# Patient Record
Sex: Female | Born: 1935 | Race: White | Hispanic: No | Marital: Single | Smoking: Former smoker
Health system: Southern US, Community
[De-identification: ages and names within clinical notes are randomized; demographics above are authoritative.]

## PROBLEM LIST (undated history)

## (undated) DIAGNOSIS — F329 Major depressive disorder, single episode, unspecified: Secondary | ICD-10-CM

## (undated) DIAGNOSIS — F1011 Alcohol abuse, in remission: Secondary | ICD-10-CM

## (undated) DIAGNOSIS — I6523 Occlusion and stenosis of bilateral carotid arteries: Secondary | ICD-10-CM

## (undated) DIAGNOSIS — C801 Malignant (primary) neoplasm, unspecified: Secondary | ICD-10-CM

## (undated) DIAGNOSIS — I219 Acute myocardial infarction, unspecified: Secondary | ICD-10-CM

## (undated) DIAGNOSIS — E079 Disorder of thyroid, unspecified: Secondary | ICD-10-CM

## (undated) DIAGNOSIS — N63 Unspecified lump in unspecified breast: Secondary | ICD-10-CM

## (undated) DIAGNOSIS — W1782XA Fall from (out of) grocery cart, initial encounter: Secondary | ICD-10-CM

## (undated) DIAGNOSIS — F32A Depression, unspecified: Secondary | ICD-10-CM

## (undated) DIAGNOSIS — C50919 Malignant neoplasm of unspecified site of unspecified female breast: Secondary | ICD-10-CM

## (undated) DIAGNOSIS — M5126 Other intervertebral disc displacement, lumbar region: Secondary | ICD-10-CM

## (undated) DIAGNOSIS — K219 Gastro-esophageal reflux disease without esophagitis: Secondary | ICD-10-CM

## (undated) DIAGNOSIS — I10 Essential (primary) hypertension: Secondary | ICD-10-CM

## (undated) DIAGNOSIS — E785 Hyperlipidemia, unspecified: Secondary | ICD-10-CM

## (undated) HISTORY — DX: Hyperlipidemia, unspecified: E78.5

## (undated) HISTORY — PX: BREAST BIOPSY: SHX20

## (undated) HISTORY — DX: Unspecified lump in unspecified breast: N63.0

## (undated) HISTORY — DX: Fall from (out of) grocery cart, initial encounter: W17.82XA

## (undated) HISTORY — DX: Disorder of thyroid, unspecified: E07.9

## (undated) HISTORY — DX: Depression, unspecified: F32.A

## (undated) HISTORY — DX: Major depressive disorder, single episode, unspecified: F32.9

## (undated) HISTORY — DX: Acute myocardial infarction, unspecified: I21.9

## (undated) HISTORY — DX: Essential (primary) hypertension: I10

## (undated) HISTORY — DX: Malignant (primary) neoplasm, unspecified: C80.1

## (undated) HISTORY — DX: Other intervertebral disc displacement, lumbar region: M51.26

## (undated) HISTORY — DX: Gastro-esophageal reflux disease without esophagitis: K21.9

## (undated) HISTORY — DX: Occlusion and stenosis of bilateral carotid arteries: I65.23

## (undated) HISTORY — DX: Malignant neoplasm of unspecified site of unspecified female breast: C50.919

## (undated) HISTORY — DX: Alcohol abuse, in remission: F10.11

---

## 1997-08-01 DIAGNOSIS — I219 Acute myocardial infarction, unspecified: Secondary | ICD-10-CM

## 1997-08-01 HISTORY — DX: Acute myocardial infarction, unspecified: I21.9

## 2004-12-08 ENCOUNTER — Encounter: Admission: RE | Admit: 2004-12-08 | Discharge: 2004-12-08 | Payer: Self-pay | Admitting: Internal Medicine

## 2004-12-14 ENCOUNTER — Encounter: Admission: RE | Admit: 2004-12-14 | Discharge: 2004-12-14 | Payer: Self-pay | Admitting: Internal Medicine

## 2004-12-16 ENCOUNTER — Encounter: Admission: RE | Admit: 2004-12-16 | Discharge: 2004-12-16 | Payer: Self-pay | Admitting: Internal Medicine

## 2005-12-09 ENCOUNTER — Encounter: Admission: RE | Admit: 2005-12-09 | Discharge: 2005-12-09 | Payer: Self-pay | Admitting: Internal Medicine

## 2006-03-20 ENCOUNTER — Encounter: Admission: RE | Admit: 2006-03-20 | Discharge: 2006-03-20 | Payer: Self-pay | Admitting: Internal Medicine

## 2006-10-27 ENCOUNTER — Ambulatory Visit: Payer: Self-pay | Admitting: Vascular Surgery

## 2006-12-13 ENCOUNTER — Encounter: Admission: RE | Admit: 2006-12-13 | Discharge: 2006-12-13 | Payer: Self-pay | Admitting: *Deleted

## 2007-05-02 ENCOUNTER — Ambulatory Visit: Payer: Self-pay | Admitting: Vascular Surgery

## 2007-11-14 ENCOUNTER — Ambulatory Visit: Payer: Self-pay | Admitting: Vascular Surgery

## 2007-12-14 ENCOUNTER — Encounter: Admission: RE | Admit: 2007-12-14 | Discharge: 2007-12-14 | Payer: Self-pay | Admitting: Family Medicine

## 2008-05-14 ENCOUNTER — Ambulatory Visit: Payer: Self-pay | Admitting: Vascular Surgery

## 2008-11-19 ENCOUNTER — Ambulatory Visit: Payer: Self-pay | Admitting: Vascular Surgery

## 2008-12-15 ENCOUNTER — Encounter: Admission: RE | Admit: 2008-12-15 | Discharge: 2008-12-15 | Payer: Self-pay | Admitting: Family Medicine

## 2009-05-06 ENCOUNTER — Ambulatory Visit: Payer: Self-pay | Admitting: Vascular Surgery

## 2009-11-18 ENCOUNTER — Ambulatory Visit: Payer: Self-pay | Admitting: Vascular Surgery

## 2009-12-16 ENCOUNTER — Encounter: Admission: RE | Admit: 2009-12-16 | Discharge: 2009-12-16 | Payer: Self-pay | Admitting: Family Medicine

## 2010-06-10 ENCOUNTER — Ambulatory Visit: Payer: Self-pay | Admitting: Vascular Surgery

## 2010-08-01 HISTORY — PX: BREAST LUMPECTOMY: SHX2

## 2010-11-15 ENCOUNTER — Other Ambulatory Visit: Payer: Self-pay | Admitting: Family Medicine

## 2010-11-15 DIAGNOSIS — Z1231 Encounter for screening mammogram for malignant neoplasm of breast: Secondary | ICD-10-CM

## 2010-12-14 NOTE — Procedures (Signed)
CAROTID DUPLEX EXAM   INDICATION:  Followup carotid artery disease.   HISTORY:  Diabetes:  Borderline.  Cardiac:  No.  Hypertension:  Yes.  Smoking:  Quit.  Previous Surgery:  No.  CV History:  No.  Amaurosis Fugax No, Paresthesias No, Hemiparesis No                                       RIGHT             LEFT  Brachial systolic pressure:         142               150  Brachial Doppler waveforms:         Biphasic          Biphasic  Vertebral direction of flow:        Antegrade         Antegrade  DUPLEX VELOCITIES (cm/sec)  CCA peak systolic                   78                94  ECA peak systolic                   334               316  ICA peak systolic                   162               309  ICA end diastolic                   41                81  PLAQUE MORPHOLOGY:                  Calcific          Calcific  PLAQUE AMOUNT:                      Moderate          Moderate/severe  PLAQUE LOCATION:                    ICA/ECA/CCA       ICA/ECA/CCA   IMPRESSION:  1. Right ICA shows evidence of 40-59% stenosis.  2. Left ICA shows evidence of 60-79% stenosis.  3. Bilateral ECA stenosis.  4. Heavy calcific plaque with shadowing bilaterally.  5. No significant changes from previous study.   ___________________________________________  Janetta Hora Fields, MD   AS/MEDQ  D:  05/14/2008  T:  05/14/2008  Job:  [id number]

## 2010-12-14 NOTE — Assessment & Plan Note (Signed)
OFFICE VISIT   Tammie [name], Tammie [name]  DOB:  February 13, 1934                                       05/02/2007  CHART#:[id number]   The patient returns for followup today for [name] moderate carotid stenosis.  She was last seen in March of 2008.  She has now been smoke-free for 8  months and I congratulated her on this today.  She continues to take  Plavix and aspirin.  She takes that her blood pressure and cholesterol  have had reasonable control.  She reports no symptoms of TIA, amaurosis,  or stroke.   PHYSICAL EXAM:  Blood pressure 164/63 in the left arm, 163/65 in the  right arm, heart rate is 47 and regular.  HEENT is unremarkable.  She  has 2+ carotid pulses with [name] left carotid bruit.  Neurologically, upper  extremity motor strength is 5/5 and symmetric.   She had been having some coolness in her feet recently.  However, on  lower extremity arterial exam, she has 2+ dorsalis pedis and posterior  tibial pulses in her right foot, [name] 2+ dorsalis pedis pulse in the left  foot, absent posterior tibial pulse in the left foot.  Her feet are warm  and well-perfused.   She had [name] repeat carotid duplex exam today, which shows less than 40%  right internal carotid artery stenosis and 40-60% left internal carotid  artery stenosis.  There was some calcification bilaterally, so this may  be actually slightly higher.  However, I do not believe her carotid  stenosis is significant enough to warrant intervention at this time.  She should continue her Plavix and aspirin.  We will continue to do  intermittent carotid duplex exams to make sure she has not had  progression of disease.  Hopefully, since she has quit smoking, this  will not progress over time.  As far as her lower extremities are  concerned, she has easily palpable pulses.  I do not believe  that she has any significant lower extremity arterial occlusive disease  that requires management at this time.  She will return for  her  intermittent carotid duplex exams.   [name], MD  Electronically Signed   CEF/MEDQ  D:  05/02/2007  T:  05/03/2007  Job:  [id number]   cc:   Darius Bump, M.D.

## 2010-12-14 NOTE — Procedures (Signed)
CAROTID DUPLEX EXAM   INDICATION:  Follow up carotid artery disease.   HISTORY:  Diabetes:  No.  Cardiac:  No.  Hypertension:  Yes.  Smoking:  Previous.  Previous Surgery:  No.  CV History:  Asymptomatic.  Amaurosis Fugax No, Paresthesias No, Hemiparesis No.                                       RIGHT             LEFT  Brachial systolic pressure:         136               140  Brachial Doppler waveforms:         WNL               WNL  Vertebral direction of flow:        Antegrade         Antegrade  DUPLEX VELOCITIES (cm/sec)  CCA peak systolic                   87                93  ECA peak systolic                   331               455  ICA peak systolic                   164               299  ICA end diastolic                   34                66  PLAQUE MORPHOLOGY:                  Calcific          Calcific  PLAQUE AMOUNT:                      Moderate          Moderate/severe  PLAQUE LOCATION:                    ICA/ECA/CCA       ICA/ECA/CCA   IMPRESSION:  1. Right internal carotid artery shows evidence of 40% to 59%      stenosis.  2. Left internal carotid artery shows evidence of 60% to 79% stenosis.  3. Bilateral external carotid artery stenoses.  4. No significant changes from previous study.   ___________________________________________  Janetta Hora Fields, MD   AS/MEDQ  D:  11/18/2009  T:  11/18/2009  Job:  [id number]

## 2010-12-14 NOTE — Procedures (Signed)
CAROTID DUPLEX EXAM   INDICATION:  Follow-up of known carotid artery disease.   HISTORY:  Diabetes:  No.  Cardiac:  No.  Hypertension:  Yes.  Smoking:  Quit 3 years ago.  Previous Surgery:  No.  CV History:  No.  Amaurosis Fugax No, Paresthesias No, Hemiparesis No.                                       RIGHT             LEFT  Brachial systolic pressure:         124               116  Brachial Doppler waveforms:         Biphasic          Biphasic  Vertebral direction of flow:        Antegrade         Antegrade  DUPLEX VELOCITIES (cm/sec)  CCA peak systolic                   108               107  ECA peak systolic                   392               441  ICA peak systolic                   183               333  ICA end diastolic                   42                76  PLAQUE MORPHOLOGY:                  Calcified         Calcified  PLAQUE AMOUNT:                      Moderate          Moderate-to-severe  PLAQUE LOCATION:                    CCA, ICA, ECA     CCA, ICA, ECA   IMPRESSION:  1. 40-59% right internal carotid artery stenosis.  2. 60-79% left internal carotid artery stenosis.     ___________________________________________  Janetta Hora Fields, MD   AC/MEDQ  D:  11/19/2008  T:  11/19/2008  Job:  [id number]

## 2010-12-14 NOTE — Procedures (Signed)
CAROTID DUPLEX EXAM   INDICATION:  Carotid disease.   HISTORY:  Diabetes:  No.  Cardiac:  No.  Hypertension:  Yes.  Smoking:  Previous.  Previous Surgery:  No.  CV History:  Currently asymptomatic.  Amaurosis Fugax No, Paresthesias No, Hemiparesis No                                       RIGHT             LEFT  Brachial systolic pressure:         128               120  Brachial Doppler waveforms:         Normal            Normal  Vertebral direction of flow:        Antegrade         Antegrade  DUPLEX VELOCITIES (cm/sec)  CCA peak systolic                   92                93  ECA peak systolic                   218               392  ICA peak systolic                   166               295  ICA end diastolic                   31                57  PLAQUE MORPHOLOGY:                  Mixed             Mixed  PLAQUE AMOUNT:                      Moderate          Moderate  PLAQUE LOCATION:                    ICA/ECA/CCA       ICA/ECA/CCA   IMPRESSION:  1. 40%-59% stenosis of the right internal carotid artery.  2. 60%-79% stenosis of the left internal carotid artery.  3. No significant change noted when compared to the previous exam on      11/19/2008.   ___________________________________________  [name], MD   CH/MEDQ  D:  05/07/2009  T:  05/08/2009  Job:  [id number]

## 2010-12-14 NOTE — Procedures (Signed)
CAROTID DUPLEX EXAM   INDICATION:  Follow up carotid artery disease.   HISTORY:  Diabetes:  No.  Cardiac:  No.  Hypertension:  Yes.  Smoking:  Quit 1-1/2 years ago.  Previous Surgery:  No.  CV History:  Amaurosis Fugax No, Paresthesias No, Hemiparesis No                                       RIGHT             LEFT  Brachial systolic pressure:         140               140  Brachial Doppler waveforms:         Biphasic          Biphasic  Vertebral direction of flow:        Antegrade         Antegrade  DUPLEX VELOCITIES (cm/sec)  CCA peak systolic                   56                76  ECA peak systolic                   191               241  ICA peak systolic                   117               180  ICA end diastolic                   20                40  PLAQUE MORPHOLOGY:                  Calcified with shadowing            Calcified with shadowing  PLAQUE AMOUNT:                      Large             Large  PLAQUE LOCATION:                    ICA, ECA          ICA, ECA   IMPRESSION:  1. Bilateral external carotid artery stenoses.  2. A 20-39% right internal carotid artery stenosis.  3. A 40-59% left internal carotid artery stenosis.  4. Calcified plaque with shadowing in both proximal internal carotid      arteries, could obscure more severe stenosis.  5. Study essentially unchanged from previous study.  6. I was not able to detect velocities as high in the left internal      carotid artery as on previous examination.   ___________________________________________  Janetta Hora Darrick Penna, MD   DP/MEDQ  D:  05/02/2007  T:  05/02/2007  Job:  [id number]

## 2010-12-14 NOTE — Procedures (Signed)
CAROTID DUPLEX EXAM   INDICATION:  Follow up carotid artery disease.   HISTORY:  Diabetes:  No  Cardiac:  No  Hypertension:  Yes  Smoking:  Previous  Previous Surgery:  No  CV History:  Asymptomatic  Amaurosis Fugax No, Paresthesias No, Hemiparesis No                                       RIGHT             LEFT  Brachial systolic pressure:         136               140  Brachial Doppler waveforms:         WNL               WNL  Vertebral direction of flow:        antegrade         antegrade  DUPLEX VELOCITIES (cm/sec)  CCA peak systolic                   78                78  ECA peak systolic                   245               381  ICA peak systolic                   133               217  ICA end diastolic                   26                50  PLAQUE MORPHOLOGY:                  heterogeneous     heterogeneous  PLAQUE AMOUNT:                      moderate          moderate  PLAQUE LOCATION:                    ICA, ECA, CCA     ICA, ECA, CCA   IMPRESSION:  1. Right internal carotid artery shows evidence of 40%-59% stenosis.  2. Left  internal carotid artery shows evidence of 60%-79% stenosis.  3. Bilateral external carotid artery stenosis.  4. No significant changes from previous study.   ___________________________________________  Janetta Hora Fields, MD   OD/MEDQ  D:  06/10/2010  T:  06/10/2010  Job:  [id number]

## 2010-12-14 NOTE — Procedures (Signed)
CAROTID DUPLEX EXAM   INDICATION:  Followup of known carotid artery disease.  The patient is  currently asymptomatic.   HISTORY:  Diabetes:  No.  Cardiac:  No.  Hypertension:  Yes.  Smoking:  No.  Previous Surgery:  No.  CV History:  Amaurosis Fugax No, Paresthesias No, Hemiparesis No                                       RIGHT             LEFT  Brachial systolic pressure:         114               120  Brachial Doppler waveforms:         WNL               WNL  Vertebral direction of flow:        Antegrade         Antegrade  DUPLEX VELOCITIES (cm/sec)  CCA peak systolic                   83                68  ECA peak systolic                   371               261  ICA peak systolic                   143               315 (O), 265 (P)  ICA end diastolic                   25                48 (O), 61 (P)  PLAQUE MORPHOLOGY:                  Calcific with shadowing             Calcific with shadowing  PLAQUE AMOUNT:                      Large             Moderate - severe  PLAQUE LOCATION:                    Bifurcation, ICA, ECA               Bifurcation, ICA, ECA   IMPRESSION:  1. Right 40-59% ICA stenosis.  2. Left 60-79% ICA stenosis (upper end of range).  3. Bilateral ECA stenoses.  4. Bilateral antegrade flow in vertebral arteries.  5. Significant calcific shadowing may have obscured higher ICA      velocities bilaterally.  6. Bilateral ICA velocities essentially unchanged from study on      10/27/2006.   ___________________________________________  [name], MD   PB/MEDQ  D:  11/14/2007  T:  11/14/2007  Job:  [id number]

## 2010-12-16 ENCOUNTER — Other Ambulatory Visit: Payer: Self-pay

## 2010-12-16 ENCOUNTER — Ambulatory Visit: Payer: Self-pay

## 2010-12-20 ENCOUNTER — Ambulatory Visit
Admission: RE | Admit: 2010-12-20 | Discharge: 2010-12-20 | Disposition: A | Discharge status: Home / Self Care | Payer: BLUE CROSS/BLUE SHIELD | Source: Ambulatory Visit | Attending: Family Medicine | Admitting: Family Medicine

## 2010-12-20 DIAGNOSIS — Z1231 Encounter for screening mammogram for malignant neoplasm of breast: Secondary | ICD-10-CM

## 2010-12-22 ENCOUNTER — Other Ambulatory Visit: Payer: Self-pay | Admitting: Family Medicine

## 2010-12-22 DIAGNOSIS — N6320 Unspecified lump in the left breast, unspecified quadrant: Secondary | ICD-10-CM

## 2010-12-24 ENCOUNTER — Ambulatory Visit (INDEPENDENT_AMBULATORY_CARE_PROVIDER_SITE_OTHER): Payer: BLUE CROSS/BLUE SHIELD

## 2010-12-24 ENCOUNTER — Other Ambulatory Visit (INDEPENDENT_AMBULATORY_CARE_PROVIDER_SITE_OTHER): Payer: BLUE CROSS/BLUE SHIELD

## 2010-12-24 DIAGNOSIS — I6529 Occlusion and stenosis of unspecified carotid artery: Secondary | ICD-10-CM

## 2010-12-24 NOTE — Assessment & Plan Note (Signed)
OFFICE VISIT  Tammie [name], Tammie [name] DOB:  10-27-1933                                       12/24/2010 CHART#:[id number]  Patient is [name] 77 year old woman who we are following for bilateral carotid stenosis which has remained stable and asymptomatic.  She has had no symptoms of TIA, stroke, or amaurosis.  She had [name] carotid duplex scan which is stable with end-diastolic velocities of 22 on the right and 65 on the left.  She is left-hand dominant.  PHYSICAL EXAMINATION:  This is [name] well-developed, well-nourished woman in no acute distress.  Her heart rate was 18 with sats of 100%. Respiratory rate was 12.  She had good and equal strength in bilateral upper and lower extremities.  She had no focal weakness or paresthesias.  PAST MEDICAL HISTORY:  She has high blood pressure, high cholesterol. She had [name] previous heart attack in 1999.  She has hypothyroid.  She has also had some hemoglobin A1C's from between 5 and 6.  She has also had 2 breast biopsies in the past.  MEDICATIONS:  Simvastatin 20 mg daily, Plavix 75 mg daily, amlodipine 10 mg daily, Benazepril 20 mg daily, furosemide 0.40 mg daily, levothyroxine 0.88 mcg daily, aspirin 81 mg daily, Caltrate and Centrum. The Caltrate she takes twice [name] day and Centrum once [name] day, Prozac 20 mg daily, and alendronate 70 mg once per week.  ASSESSMENT/PLAN:  Stable mild carotid stenosis of 40% to 59% on the right and 60% and 79% on the left without symptoms.  She will follow up in 6 months for routine vascular lab.  Della Goo, PA-C  Fransisco Hertz, MD Electronically Signed  RR/MEDQ  D:  12/24/2010  T:  12/24/2010  Job:  [id number]

## 2010-12-30 ENCOUNTER — Ambulatory Visit
Admission: RE | Admit: 2010-12-30 | Discharge: 2010-12-30 | Disposition: A | Discharge status: Home / Self Care | Payer: BLUE CROSS/BLUE SHIELD | Source: Ambulatory Visit | Attending: Family Medicine | Admitting: Family Medicine

## 2010-12-30 ENCOUNTER — Other Ambulatory Visit: Payer: Self-pay | Admitting: Family Medicine

## 2010-12-30 DIAGNOSIS — N6320 Unspecified lump in the left breast, unspecified quadrant: Secondary | ICD-10-CM

## 2010-12-30 NOTE — Procedures (Unsigned)
CAROTID DUPLEX EXAM  INDICATION:  Followup carotid artery disease.  HISTORY: Diabetes:  No. Cardiac:  No. Hypertension:  Yes. Smoking:  Previous. Previous Surgery:  No. CV History:  The patient is currently asymptomatic. Amaurosis Fugax No, Paresthesias No, Hemiparesis No                                      RIGHT             LEFT Brachial systolic pressure:         163               164 Brachial Doppler waveforms:         WNL               WNL Vertebral direction of flow:        Antegrade         Antegrade DUPLEX VELOCITIES (cm/sec) CCA peak systolic                   88                87 ECA peak systolic                   315               322 ICA peak systolic                   144               280 ICA end diastolic                   22                65 PLAQUE MORPHOLOGY:                  Heterogeneous     Heterogeneous PLAQUE AMOUNT:                      Moderate          Moderate PLAQUE LOCATION:                    CCA, ICA, ECA     CCA, ICA, ECA  IMPRESSION:  Right-sided 40%-59% stenosis within internal carotid artery.  Left-sided 60%-79% stenosis within internal carotid artery. Bilateral external carotid artery stenosis.  Bilateral intimal thickening within the common carotid arteries.  Study stable compared to previous.   ___________________________________________ Janetta Hora Fields, MD  OD/MEDQ  D:  12/24/2010  T:  12/24/2010  Job:  [id number]

## 2011-01-10 ENCOUNTER — Other Ambulatory Visit: Payer: Self-pay | Admitting: Diagnostic Radiology

## 2011-01-10 ENCOUNTER — Ambulatory Visit
Admission: RE | Admit: 2011-01-10 | Discharge: 2011-01-10 | Disposition: A | Discharge status: Home / Self Care | Payer: BLUE CROSS/BLUE SHIELD | Source: Ambulatory Visit | Attending: Family Medicine | Admitting: Family Medicine

## 2011-01-10 ENCOUNTER — Other Ambulatory Visit: Payer: Self-pay | Admitting: Family Medicine

## 2011-01-10 DIAGNOSIS — N6320 Unspecified lump in the left breast, unspecified quadrant: Secondary | ICD-10-CM

## 2011-01-11 ENCOUNTER — Other Ambulatory Visit: Payer: Self-pay | Admitting: Family Medicine

## 2011-01-11 DIAGNOSIS — C50912 Malignant neoplasm of unspecified site of left female breast: Secondary | ICD-10-CM

## 2011-01-14 ENCOUNTER — Ambulatory Visit
Admission: RE | Admit: 2011-01-14 | Discharge: 2011-01-14 | Disposition: A | Discharge status: Home / Self Care | Payer: Medicare Other | Source: Ambulatory Visit | Attending: Family Medicine | Admitting: Family Medicine

## 2011-01-14 DIAGNOSIS — C50912 Malignant neoplasm of unspecified site of left female breast: Secondary | ICD-10-CM

## 2011-01-14 MED ORDER — GADOBENATE DIMEGLUMINE 529 MG/ML IV SOLN
10.0000 mL | Freq: Once | INTRAVENOUS | Status: AC | PRN
  Administered 2011-01-14: 10 mL via INTRAVENOUS

## 2011-01-19 ENCOUNTER — Other Ambulatory Visit: Payer: Self-pay | Admitting: Oncology

## 2011-01-19 ENCOUNTER — Other Ambulatory Visit: Payer: Self-pay | Admitting: Family Medicine

## 2011-01-19 ENCOUNTER — Encounter (HOSPITAL_BASED_OUTPATIENT_CLINIC_OR_DEPARTMENT_OTHER): Payer: Medicare Other | Admitting: Oncology

## 2011-01-19 DIAGNOSIS — R928 Other abnormal and inconclusive findings on diagnostic imaging of breast: Secondary | ICD-10-CM

## 2011-01-19 DIAGNOSIS — C50319 Malignant neoplasm of lower-inner quadrant of unspecified female breast: Secondary | ICD-10-CM

## 2011-01-19 LAB — COMPREHENSIVE METABOLIC PANEL
Albumin: 5 g/dL (ref 3.5–5.2)
Alkaline Phosphatase: 57 U/L (ref 39–117)
CO2: 32 mEq/L (ref 19–32)
Glucose, Bld: 94 mg/dL (ref 70–99)
Potassium: 3.7 mEq/L (ref 3.5–5.3)
Sodium: 139 mEq/L (ref 135–145)
Total Protein: 8.6 g/dL — ABNORMAL HIGH (ref 6.0–8.3)

## 2011-01-19 LAB — CBC WITH DIFFERENTIAL/PLATELET
Basophils Absolute: 0.1 10*3/uL (ref 0.0–0.1)
EOS%: 0.7 % (ref 0.0–7.0)
Eosinophils Absolute: 0 10*3/uL (ref 0.0–0.5)
HCT: 39.6 % (ref 34.8–46.6)
HGB: 13.3 g/dL (ref 11.6–15.9)
LYMPH%: 24.3 % (ref 14.0–49.7)
MCH: 30.9 pg (ref 25.1–34.0)
MCV: 92.2 fL (ref 79.5–101.0)
MONO%: 7.1 % (ref 0.0–14.0)
NEUT#: 4.7 10*3/uL (ref 1.5–6.5)
NEUT%: 66.8 % (ref 38.4–76.8)
WBC: 7 10*3/uL (ref 3.9–10.3)

## 2011-01-25 ENCOUNTER — Ambulatory Visit
Admission: RE | Admit: 2011-01-25 | Discharge: 2011-01-25 | Disposition: A | Discharge status: Home / Self Care | Payer: Medicare Other | Source: Ambulatory Visit | Attending: Family Medicine | Admitting: Family Medicine

## 2011-01-25 ENCOUNTER — Other Ambulatory Visit: Payer: Self-pay | Admitting: Diagnostic Radiology

## 2011-01-25 DIAGNOSIS — R928 Other abnormal and inconclusive findings on diagnostic imaging of breast: Secondary | ICD-10-CM

## 2011-01-25 MED ORDER — GADOBENATE DIMEGLUMINE 529 MG/ML IV SOLN
10.0000 mL | Freq: Once | INTRAVENOUS | Status: AC | PRN
  Administered 2011-01-25: 10 mL via INTRAVENOUS

## 2011-02-01 ENCOUNTER — Telehealth (INDEPENDENT_AMBULATORY_CARE_PROVIDER_SITE_OTHER): Payer: Self-pay

## 2011-02-04 ENCOUNTER — Other Ambulatory Visit (INDEPENDENT_AMBULATORY_CARE_PROVIDER_SITE_OTHER): Payer: Self-pay | Admitting: General Surgery

## 2011-02-04 DIAGNOSIS — C50919 Malignant neoplasm of unspecified site of unspecified female breast: Secondary | ICD-10-CM

## 2011-02-04 DIAGNOSIS — C50912 Malignant neoplasm of unspecified site of left female breast: Secondary | ICD-10-CM

## 2011-02-11 NOTE — Telephone Encounter (Signed)
Returning Tammie [name] call, patient was calling to check the status of her surgical orders. Patient aware that Dr. Johna Sheriff will need to review pathology report during his next clinic.

## 2011-02-17 ENCOUNTER — Ambulatory Visit
Admission: RE | Admit: 2011-02-17 | Discharge: 2011-02-17 | Disposition: A | Discharge status: Home / Self Care | Payer: Medicare Other | Source: Ambulatory Visit | Attending: General Surgery | Admitting: General Surgery

## 2011-02-17 ENCOUNTER — Other Ambulatory Visit (INDEPENDENT_AMBULATORY_CARE_PROVIDER_SITE_OTHER): Payer: Self-pay | Admitting: General Surgery

## 2011-02-17 ENCOUNTER — Encounter (HOSPITAL_BASED_OUTPATIENT_CLINIC_OR_DEPARTMENT_OTHER)
Admission: RE | Admit: 2011-02-17 | Discharge: 2011-02-17 | Disposition: A | Discharge status: Home / Self Care | Payer: Medicare Other | Source: Ambulatory Visit | Attending: General Surgery | Admitting: General Surgery

## 2011-02-17 DIAGNOSIS — Z01811 Encounter for preprocedural respiratory examination: Secondary | ICD-10-CM

## 2011-02-17 LAB — BASIC METABOLIC PANEL
BUN: 19 mg/dL (ref 6–23)
Creatinine, Ser: 1 mg/dL (ref 0.50–1.10)
GFR calc Af Amer: >60 mL/min (ref >60)
GFR calc non Af Amer: 54 mL/min — ABNORMAL LOW (ref >60)
Potassium: 4.5 mEq/L (ref 3.5–5.1)

## 2011-02-21 ENCOUNTER — Other Ambulatory Visit (INDEPENDENT_AMBULATORY_CARE_PROVIDER_SITE_OTHER): Payer: Self-pay | Admitting: General Surgery

## 2011-02-21 ENCOUNTER — Ambulatory Visit (HOSPITAL_BASED_OUTPATIENT_CLINIC_OR_DEPARTMENT_OTHER)
Admission: RE | Admit: 2011-02-21 | Discharge: 2011-02-21 | Disposition: A | Discharge status: Home / Self Care | Payer: Medicare Other | Source: Ambulatory Visit | Attending: General Surgery | Admitting: General Surgery

## 2011-02-21 ENCOUNTER — Ambulatory Visit
Admission: RE | Admit: 2011-02-21 | Discharge: 2011-02-21 | Disposition: A | Discharge status: Home / Self Care | Payer: Medicare Other | Source: Ambulatory Visit | Attending: General Surgery | Admitting: General Surgery

## 2011-02-21 ENCOUNTER — Ambulatory Visit (HOSPITAL_COMMUNITY)
Admission: RE | Admit: 2011-02-21 | Discharge: 2011-02-21 | Disposition: A | Discharge status: Home / Self Care | Payer: Medicare Other | Source: Ambulatory Visit | Attending: General Surgery | Admitting: General Surgery

## 2011-02-21 DIAGNOSIS — C50919 Malignant neoplasm of unspecified site of unspecified female breast: Secondary | ICD-10-CM

## 2011-02-21 DIAGNOSIS — Z01810 Encounter for preprocedural cardiovascular examination: Secondary | ICD-10-CM | POA: Insufficient documentation

## 2011-02-21 DIAGNOSIS — C50912 Malignant neoplasm of unspecified site of left female breast: Secondary | ICD-10-CM

## 2011-02-21 DIAGNOSIS — I2510 Atherosclerotic heart disease of native coronary artery without angina pectoris: Secondary | ICD-10-CM | POA: Insufficient documentation

## 2011-02-21 DIAGNOSIS — E039 Hypothyroidism, unspecified: Secondary | ICD-10-CM | POA: Insufficient documentation

## 2011-02-21 DIAGNOSIS — I6529 Occlusion and stenosis of unspecified carotid artery: Secondary | ICD-10-CM | POA: Insufficient documentation

## 2011-02-21 DIAGNOSIS — Z01812 Encounter for preprocedural laboratory examination: Secondary | ICD-10-CM | POA: Insufficient documentation

## 2011-02-21 DIAGNOSIS — D0590 Unspecified type of carcinoma in situ of unspecified breast: Secondary | ICD-10-CM | POA: Insufficient documentation

## 2011-02-21 DIAGNOSIS — I10 Essential (primary) hypertension: Secondary | ICD-10-CM | POA: Insufficient documentation

## 2011-02-21 DIAGNOSIS — Z01818 Encounter for other preprocedural examination: Secondary | ICD-10-CM | POA: Insufficient documentation

## 2011-02-21 MED ORDER — TECHNETIUM TC 99M SULFUR COLLOID FILTERED
1.0000 | Freq: Once | INTRAVENOUS | Status: AC | PRN
  Administered 2011-02-21: 1 via INTRADERMAL

## 2011-02-22 NOTE — Op Note (Addendum)
Tammie [name], Tammie [name]               ACCOUNT NO.:  [id number]  MEDICAL RECORD NO.:  [id number]  LOCATION:  [facility]                          FACILITY:  [facility]  PHYSICIAN:  Tammie [name], M.D.DATE OF BIRTH:  01/21/1934  DATE OF PROCEDURE:  02/21/2011 DATE OF DISCHARGE:                              OPERATIVE REPORT   PREOPERATIVE DIAGNOSIS:  Cancer of the left breast.  POSTOPERATIVE DIAGNOSIS:  Cancer of the left breast.  SURGICAL PROCEDURES: 1. Blue dye injection, left breast. 2. Needle-localized left breast lumpectomy. 3. Left axillary sentinel lymph node biopsy.  SURGEON:  Tammie [name]. Tammie Godown, MD  ANESTHESIA:  General.  BRIEF HISTORY:  The patient is a 77 year old female who on recent screening mammogram was found to have a new approximately 5-mm suspicious mass in the medial aspect of the left breast.  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  Further workup with MRI and biopsy of another area and the breast have confirmed this is the only lesion clinical T1, N0, M0.  We have elected to proceed with initial surgical treatment with breast conservation with needle-localized lumpectomy and axillary sentinel lymph node biopsy.  We have discussed the nature of procedure, indications, risks of anesthetic complications, bleeding, infection, lymphedema, possible need for further surgery based on final pathology. She is now brought to the operating room for this procedure.  DESCRIPTION OF OPERATION:  The patient was brought to the operating room and placed in supine position on the table and laryngeal mask general anesthesia was induced.  She had undergone previous needle localization. She had two wires bracketing the lesion and the clip which was somewhat posterior lateral to the actual tumor.  Prior to anesthesia, she had 1 mCi of technetium sulfur colloid injected intradermally around the left nipple.  The left breast was  sterilely prepped and the patient's preoperative checklist was performed and correct patient and procedure were verified.  With a sterile technique, 10 mL of dilute methylene blue was injected subcutaneously beneath the right nipple and massaged for several minutes.  The breast was then further widely and sterilely prepped and draped including the upper arm and axilla.  A transverse incision was made over the medial aspect of the breast.  Dissection was carried down through the subcutaneous tissue to the breast capsule.  The wires were identified and the subcutaneous tissue medially and brought into the incision.  I then incised a specimen of breast tissue that encompassed both wires around the tip using cautery.  Posteriorly, this went down to the chest wall.  Radiographically, the lesion was between the two wires and the clip was just posterior to the posterior wire. The specimen was removed and oriented with ink.  Specimen myography was obtained, which showed both wires, the clip and the lesion well within the specimen.  The soft tissue was infiltrated with Marcaine and complete hemostasis was obtained with cautery.  The breast tissue and the subcutaneous tissue were reapproximated with interrupted Vicryls.  I then proceeded with the sentinel lymph node biopsy.  A hot area of the left axilla was identified with the NeoProbe and a small transverse incision made along the skin crease.  Dissection was carried down through the subcu and into the axilla proper with cautery, guided by the NeoProbe.  Careful blunt dissection down into the axilla then revealed a small bright blue lymph node that was hot.  This was elevated and completely excised with cautery.  Ex vivo, the node had counts for about 400, the background in the axilla was less than 10.  This was sent for permanent sections as a hot blue left axillary sentinel lymph node. This tissue was also infiltrated with Marcaine and complete  hemostasis assured.  The subcu was closed with interrupted Vicryl.  Both skin incisions were then closed with subcuticular Monocryl and Dermabond. Sponge and needle counts were correct.  The patient was taken to the recovery room in good condition.     Tammie [name]. Tammie [name], M.D.     [name]  D:  02/21/2011  T:  02/21/2011  Job:  [id number]  Electronically Signed by Glenna Fellows M.D. on 03/07/2011 04:41:10 PM

## 2011-02-23 ENCOUNTER — Telehealth (INDEPENDENT_AMBULATORY_CARE_PROVIDER_SITE_OTHER): Payer: Self-pay | Admitting: General Surgery

## 2011-02-23 ENCOUNTER — Other Ambulatory Visit (INDEPENDENT_AMBULATORY_CARE_PROVIDER_SITE_OTHER): Payer: Self-pay | Admitting: General Surgery

## 2011-02-23 DIAGNOSIS — R9389 Abnormal findings on diagnostic imaging of other specified body structures: Secondary | ICD-10-CM

## 2011-02-23 NOTE — Telephone Encounter (Signed)
Called pt and discussed path. Margins "close".  Will need to discuss with path and rad onc

## 2011-02-23 NOTE — Telephone Encounter (Signed)
Called, no answer.

## 2011-03-07 ENCOUNTER — Ambulatory Visit
Admission: RE | Admit: 2011-03-07 | Discharge: 2011-03-07 | Disposition: A | Discharge status: Home / Self Care | Payer: Medicare Other | Source: Ambulatory Visit | Attending: General Surgery | Admitting: General Surgery

## 2011-03-07 DIAGNOSIS — R9389 Abnormal findings on diagnostic imaging of other specified body structures: Secondary | ICD-10-CM

## 2011-03-07 MED ORDER — IOHEXOL 300 MG/ML  SOLN
75.0000 mL | Freq: Once | INTRAMUSCULAR | Status: AC | PRN
  Administered 2011-03-07: 75 mL via INTRAVENOUS

## 2011-03-09 ENCOUNTER — Ambulatory Visit: Payer: Medicare Other | Admitting: Radiation Oncology

## 2011-03-10 ENCOUNTER — Telehealth (INDEPENDENT_AMBULATORY_CARE_PROVIDER_SITE_OTHER): Payer: Self-pay | Admitting: General Surgery

## 2011-03-10 NOTE — Telephone Encounter (Signed)
Patient calling for CT results that was ordered due to abnormal chest Xray. Patient was very anxious, I let her know that her lungs looked clear and there were no lung nodules/masses. I did not go into further detail. If you could review the CT and advise of other findings.

## 2011-03-13 NOTE — Telephone Encounter (Signed)
Please call pt to schedule MRI of liver. "probobly ok" but need to F/U

## 2011-03-14 ENCOUNTER — Encounter (HOSPITAL_BASED_OUTPATIENT_CLINIC_OR_DEPARTMENT_OTHER): Payer: Medicare Other | Admitting: Oncology

## 2011-03-14 DIAGNOSIS — C50319 Malignant neoplasm of lower-inner quadrant of unspecified female breast: Secondary | ICD-10-CM

## 2011-03-21 ENCOUNTER — Telehealth (INDEPENDENT_AMBULATORY_CARE_PROVIDER_SITE_OTHER): Payer: Self-pay

## 2011-03-21 ENCOUNTER — Other Ambulatory Visit (INDEPENDENT_AMBULATORY_CARE_PROVIDER_SITE_OTHER): Payer: Self-pay | Admitting: General Surgery

## 2011-03-21 DIAGNOSIS — R932 Abnormal findings on diagnostic imaging of liver and biliary tract: Secondary | ICD-10-CM

## 2011-03-21 NOTE — Telephone Encounter (Signed)
Left message for patient to call our office to schedule further diagnostic testing

## 2011-03-22 ENCOUNTER — Other Ambulatory Visit (INDEPENDENT_AMBULATORY_CARE_PROVIDER_SITE_OTHER): Payer: Self-pay

## 2011-03-24 ENCOUNTER — Ambulatory Visit
Admission: RE | Admit: 2011-03-24 | Discharge: 2011-03-24 | Disposition: A | Discharge status: Home / Self Care | Payer: Medicare Other | Source: Ambulatory Visit | Attending: General Surgery | Admitting: General Surgery

## 2011-03-24 DIAGNOSIS — R932 Abnormal findings on diagnostic imaging of liver and biliary tract: Secondary | ICD-10-CM

## 2011-03-24 MED ORDER — GADOBENATE DIMEGLUMINE 529 MG/ML IV SOLN
9.0000 mL | Freq: Once | INTRAVENOUS | Status: AC | PRN
  Administered 2011-03-24: 9 mL via INTRAVENOUS

## 2011-03-25 ENCOUNTER — Encounter (INDEPENDENT_AMBULATORY_CARE_PROVIDER_SITE_OTHER): Payer: Self-pay | Admitting: General Surgery

## 2011-03-25 ENCOUNTER — Ambulatory Visit (INDEPENDENT_AMBULATORY_CARE_PROVIDER_SITE_OTHER): Payer: Medicare Other | Admitting: General Surgery

## 2011-03-25 VITALS — BP 150/66 | Temp 97.4°F

## 2011-03-25 DIAGNOSIS — Z9889 Other specified postprocedural states: Secondary | ICD-10-CM

## 2011-03-25 DIAGNOSIS — C50512 Malignant neoplasm of lower-outer quadrant of left female breast: Secondary | ICD-10-CM | POA: Insufficient documentation

## 2011-03-25 DIAGNOSIS — C50919 Malignant neoplasm of unspecified site of unspecified female breast: Secondary | ICD-10-CM

## 2011-03-25 HISTORY — DX: Malignant neoplasm of unspecified site of unspecified female breast: C50.919

## 2011-03-25 NOTE — Progress Notes (Signed)
She'll return to the office following left breast lumpectomy and left axillary sentinel lymph node biopsy. She reports she is doing well with no problems from her surgical sites.  Exam: She appears well. Left breast lumpectomy site is well healed without complication. Axillary wound looks fine.  We reviewed her pathology. This revealed a 0.48 cm grade 3 invasive ductal cancer. It is 1 mm from one margin. 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  The patient had a followup MRI due to questionable liver abnormality on CT. This has shown some vague changes felt to be either fibrosis or possible hemangioma but not consistent with metastatic disease.  A reviewed all these findings with the patient. She has seen Dr. Welton Flakes. Hormonal treatment has been recommended and she has an appointment with radiation oncology.  Patient is doing well and will return to see me for routine followup in 6 months.

## 2011-03-25 NOTE — Patient Instructions (Signed)
Call as needed for questions or concerns 

## 2011-03-28 ENCOUNTER — Ambulatory Visit: Payer: Medicare Other | Admitting: Radiation Oncology

## 2011-03-28 ENCOUNTER — Ambulatory Visit
Admission: RE | Admit: 2011-03-28 | Discharge: 2011-03-28 | Disposition: A | Discharge status: Home / Self Care | Payer: Medicare Other | Source: Ambulatory Visit | Attending: Radiation Oncology | Admitting: Radiation Oncology

## 2011-03-28 DIAGNOSIS — C50919 Malignant neoplasm of unspecified site of unspecified female breast: Secondary | ICD-10-CM | POA: Insufficient documentation

## 2011-03-28 DIAGNOSIS — Z510 Encounter for antineoplastic radiation therapy: Secondary | ICD-10-CM | POA: Insufficient documentation

## 2011-06-03 ENCOUNTER — Ambulatory Visit (HOSPITAL_BASED_OUTPATIENT_CLINIC_OR_DEPARTMENT_OTHER): Payer: Medicare Other | Admitting: Oncology

## 2011-06-03 ENCOUNTER — Telehealth: Payer: Self-pay | Admitting: *Deleted

## 2011-06-03 ENCOUNTER — Other Ambulatory Visit: Payer: Self-pay | Admitting: Oncology

## 2011-06-03 VITALS — BP 163/62 | HR 54 | Temp 98.4°F | Ht 62.0 in | Wt 113.4 lb

## 2011-06-03 DIAGNOSIS — D0590 Unspecified type of carcinoma in situ of unspecified breast: Secondary | ICD-10-CM

## 2011-06-03 DIAGNOSIS — C50319 Malignant neoplasm of lower-inner quadrant of unspecified female breast: Secondary | ICD-10-CM

## 2011-06-03 DIAGNOSIS — Z923 Personal history of irradiation: Secondary | ICD-10-CM

## 2011-06-03 DIAGNOSIS — C50919 Malignant neoplasm of unspecified site of unspecified female breast: Secondary | ICD-10-CM

## 2011-06-03 LAB — CBC WITH DIFFERENTIAL/PLATELET
Basophils Absolute: 0 10*3/uL (ref 0.0–0.1)
HCT: 39.5 % (ref 34.8–46.6)
HGB: 12.8 g/dL (ref 11.6–15.9)
MONO#: 0.4 10*3/uL (ref 0.1–0.9)
NEUT%: 67.5 % (ref 38.4–76.8)
WBC: 5.4 10*3/uL (ref 3.9–10.3)
lymph#: 1.3 10*3/uL (ref 0.9–3.3)

## 2011-06-03 LAB — COMPREHENSIVE METABOLIC PANEL
ALT: 20 U/L (ref 0–35)
BUN: 20 mg/dL (ref 6–23)
CO2: 27 mEq/L (ref 19–32)
Calcium: 10 mg/dL (ref 8.4–10.5)
Chloride: 102 mEq/L (ref 96–112)
Creatinine, Ser: 1.26 mg/dL — ABNORMAL HIGH (ref 0.50–1.10)
Glucose, Bld: 84 mg/dL (ref 70–99)

## 2011-06-06 ENCOUNTER — Other Ambulatory Visit: Payer: Self-pay | Admitting: *Deleted

## 2011-06-06 NOTE — Progress Notes (Signed)
CC:   Cain Saupe, MD Lorne Skeens. Hoxworth, M.D. Billie Lade, Ph.D., M.D.  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  She had a lumpectomy on 02/21/2011.  The tumor was ER positive 99%, PR positive 21%, Ki-67 62% and HER2 negative.  PAST THERAPY:  The patient is status post: 1. Lumpectomy on 02/21/2011. 2. She has completed radiation therapy to the left breast on     05/21/2011. 3. She will begin Arimidex 1 mg daily.  INTERVAL HISTORY:  The patient is seen in followup.  Overall she is doing well without any significant complaints.  She tolerated radiation quite well.  She has no fevers, chills, night sweats, headaches.  No skin changes.  She has a little bit of sensitivity in the breast and remainder of the 10 point review of systems is negative and scanned separately.  CURRENT MEDICATIONS:  Reviewed and they are updated in the EMR.  PERFORMANCE STATUS:  ECOG performance status is 1.  PHYSICAL EXAMINATION:  General:  The patient is awake, alert, in no acute distress.  She appears well.  Vital signs:  Temperature is 98.4, pulse 54, respirations 20, blood pressure 153/62 and weight is 113.4 pounds.  HEENT:  EOMI.  PERRLA.  Sclerae anicteric.  No conjunctival pallor.  Oral mucosa is moist.  Neck:  Supple.  Lungs:  Are clear. Cardiovascular:  Regular rate and rhythm.  Abdomen:  Soft, nontender, nondistended.  Bowel sounds are present.  No HSM.  Extremities:  No clubbing, edema or cyanosis.  Neuro:  The patient is alert and oriented, otherwise nonfocal.  Breasts:  Right breast no masses or nipple discharge.  Left surgical scar looks very well on the left breast. There is some darkening of the skin secondary to radiation.  No desquamation of the skin.  LABORATORY DATA:  WBC 5.4, hemoglobin 12.8, hematocrit 39.5, platelets are 184,000.  IMPRESSION AND PLAN:  A 77 year old female  with stage I invasive ductal carcinoma of the left breast status post lumpectomy on 02/21/2011 with a sentinel node biopsy.  She was found to have a 0.48 cm invasive ductal carcinoma grade 3 with associated ductal carcinoma in situ.  She is now status post radiation therapy completed on 05/21/2011.  We will proceed with adjuvant antiestrogen therapy consisting of Arimidex 1 mg daily. Risks and benefits of this have been explained to her on multiple occasions.  She was given literature and a prescription was given to her today.  I will plan on seeing her back in 3 months' time for followup.  Of course, I can see her sooner if need arises.   ______________________________ Drue Second, M.D. KK/MEDQ  D:  06/03/2011  T:  06/06/2011  Job:  [id number]

## 2011-06-20 ENCOUNTER — Ambulatory Visit: Payer: Medicare Other | Admitting: Radiation Oncology

## 2011-06-20 ENCOUNTER — Encounter: Payer: Self-pay | Admitting: *Deleted

## 2011-06-21 ENCOUNTER — Ambulatory Visit
Admission: RE | Admit: 2011-06-21 | Discharge: 2011-06-21 | Disposition: A | Discharge status: Home / Self Care | Payer: Medicare Other | Source: Ambulatory Visit | Attending: Radiation Oncology | Admitting: Radiation Oncology

## 2011-06-21 ENCOUNTER — Encounter: Payer: Self-pay | Admitting: Radiation Oncology

## 2011-06-21 VITALS — BP 145/63 | HR 53 | Temp 97.0°F | Resp 20 | Wt 112.8 lb

## 2011-06-21 DIAGNOSIS — C50919 Malignant neoplasm of unspecified site of unspecified female breast: Secondary | ICD-10-CM

## 2011-06-21 NOTE — Progress Notes (Signed)
Pt started arimidex 1mg  daily 06/03/11, radiation therapy for left breast  Ca, 04/14/11-05/12/11 nkda   Here for f/u, has osteoporosis in back lower mouth wisdom tooth,seeing today at 2pm DR Lorin Picket . Rehm, Oral Surgeon,  No c/o pain in breast. Well healed 11:51 AM

## 2011-06-21 NOTE — Progress Notes (Signed)
CC:   Tammie [name], M.D. Tammie [name]. Hoxworth, M.D.  DIAGNOSIS:  Left breast cancer.  NARRATIVE:  Tammie [name] comes in today for routine 1-month followup.  She completed accelerated hypofractionated radiation therapy directed at the left breast area.  Clinically, the patient seems to be doing well.  She denies any itching or discomfort in the breast area.  The patient did start Arimidex and seems to be tolerating this well.  Patient is scheduled to see Dr. Johna Sheriff in February.  PHYSICAL EXAMINATION:  The patient's weight is stable at 112. Examination of the neck and supraclavicular region reveals no evidence of adenopathy.  The axillary areas are free of adenopathy.  Examination of the lungs reveals them to be clear.  Heart:  Has a regular rhythm and rate.  Examination of the right breast reveals no mass or nipple discharge.  Examination of the left breast reveals some mild hyperpigmentation changes and mild edema.  There is no dominant mass appreciated in the breast, nipple discharge or bleeding.  IMPRESSION AND PLAN:  Clinically no evidence of disease.  In light of the patient's close followup with Dr. Welton Flakes and Dr. Johna Sheriff, I have not scheduled Tammie [name] for formal followup appointment but would be glad to see her at any time.    ______________________________ Billie Lade, Ph.D., M.D. JDK/MEDQ  D:  06/21/2011  T:  06/21/2011  Job:  [id number]

## 2011-06-30 ENCOUNTER — Other Ambulatory Visit (INDEPENDENT_AMBULATORY_CARE_PROVIDER_SITE_OTHER): Payer: Medicare Other

## 2011-06-30 DIAGNOSIS — I6529 Occlusion and stenosis of unspecified carotid artery: Secondary | ICD-10-CM

## 2011-07-04 ENCOUNTER — Encounter: Payer: Self-pay | Admitting: Vascular Surgery

## 2011-07-04 NOTE — Procedures (Unsigned)
CAROTID DUPLEX EXAM  INDICATION:  Carotid artery stenosis.  HISTORY: Diabetes:  No. Cardiac:  No. Hypertension:  Yes. Smoking:  Previously. Previous Surgery:  No. CV History: Amaurosis Fugax No, Paresthesias No, Hemiparesis No.                                      RIGHT             LEFT Brachial systolic pressure:         148               146 Brachial Doppler waveforms:         Triphasic         Triphasic Vertebral direction of flow:        Antegrade         Antegrade DUPLEX VELOCITIES (cm/sec) CCA peak systolic                   96                94 ECA peak systolic                   367               487 ICA peak systolic                   178               312 ICA end diastolic                   40                68 PLAQUE MORPHOLOGY:                  Calcified         Calcified PLAQUE AMOUNT:                      Moderate          Moderate PLAQUE LOCATION:                    CCA, ICA, ECA     CCA, ICA, ECA.  DUPLEX IMAGING:  The distal right internal carotid artery is tortuous.  IMPRESSION: 1. 40% to 59% right internal carotid artery stenosis. 2. 60% to 79% left internal carotid artery stenosis. 3. Significant bilateral external carotid artery stenosis. 4. Note:  No significant change in velocities since the previous     examination performed in this lab on 12/24/10.  ___________________________________________ [name], MD  CI/MEDQ  D:  06/30/2011  T:  06/30/2011  Job:  [id number]

## 2011-07-06 ENCOUNTER — Other Ambulatory Visit: Payer: Self-pay

## 2011-07-06 DIAGNOSIS — I6529 Occlusion and stenosis of unspecified carotid artery: Secondary | ICD-10-CM

## 2011-08-30 ENCOUNTER — Other Ambulatory Visit: Payer: Self-pay | Admitting: *Deleted

## 2011-08-30 DIAGNOSIS — C50919 Malignant neoplasm of unspecified site of unspecified female breast: Secondary | ICD-10-CM

## 2011-08-30 MED ORDER — ANASTROZOLE 1 MG PO TABS: 1.0000 mg | ORAL_TABLET | Freq: Every day | ORAL | Status: DC

## 2011-09-13 ENCOUNTER — Telehealth: Payer: Self-pay | Admitting: *Deleted

## 2011-09-13 NOTE — Telephone Encounter (Signed)
patient stated no one told her she would have to come back and see dr.khan so she has cancelled her 09-2011 appointment

## 2011-09-15 ENCOUNTER — Ambulatory Visit: Payer: Medicare Other | Admitting: Oncology

## 2011-09-15 ENCOUNTER — Other Ambulatory Visit: Payer: Medicare Other | Admitting: Lab

## 2011-09-30 ENCOUNTER — Encounter (INDEPENDENT_AMBULATORY_CARE_PROVIDER_SITE_OTHER): Payer: Self-pay | Admitting: General Surgery

## 2011-09-30 ENCOUNTER — Ambulatory Visit (INDEPENDENT_AMBULATORY_CARE_PROVIDER_SITE_OTHER): Payer: Medicare Other | Admitting: General Surgery

## 2011-09-30 DIAGNOSIS — C50919 Malignant neoplasm of unspecified site of unspecified female breast: Secondary | ICD-10-CM

## 2011-09-30 NOTE — Progress Notes (Signed)
Chief complaint: Followup breast cancer.  History: Patient returns for followup for her T1aN0 ER-positive left breast cancer status post lumpectomy, radiation, and now on adjuvant anastrozole. She reports she is doing very well. She really had no difficulties with radiation or hormonal treatment. She has had a couple of brief twinges of pain at the lumpectomy site but this has resolved.  Exam: Gen.: A thin elderly female in no acute distress Lymph nodes: No cervical, subclavicular, or axillary nodes palpable Breasts: No palpable masses in either breast with radicular attention to the lumpectomy site of the left breast.  Assessment plan: Doing well with no clinical evidence of early recurrence or complications from treatment. Imaging is due in May. Return in 6 months.

## 2011-11-25 ENCOUNTER — Other Ambulatory Visit: Payer: Self-pay | Admitting: Oncology

## 2011-11-25 DIAGNOSIS — Z853 Personal history of malignant neoplasm of breast: Secondary | ICD-10-CM

## 2011-11-25 DIAGNOSIS — Z9889 Other specified postprocedural states: Secondary | ICD-10-CM

## 2011-12-23 ENCOUNTER — Other Ambulatory Visit: Payer: Self-pay | Admitting: Oncology

## 2011-12-23 ENCOUNTER — Ambulatory Visit
Admission: RE | Admit: 2011-12-23 | Discharge: 2011-12-23 | Disposition: A | Discharge status: Home / Self Care | Payer: Medicare Other | Source: Ambulatory Visit | Attending: Oncology | Admitting: Oncology

## 2011-12-23 DIAGNOSIS — Z9889 Other specified postprocedural states: Secondary | ICD-10-CM

## 2011-12-23 DIAGNOSIS — Z853 Personal history of malignant neoplasm of breast: Secondary | ICD-10-CM

## 2011-12-27 ENCOUNTER — Other Ambulatory Visit: Payer: Self-pay | Admitting: *Deleted

## 2011-12-27 ENCOUNTER — Telehealth: Payer: Self-pay | Admitting: *Deleted

## 2011-12-27 DIAGNOSIS — C50919 Malignant neoplasm of unspecified site of unspecified female breast: Secondary | ICD-10-CM

## 2011-12-27 MED ORDER — ANASTROZOLE 1 MG PO TABS: 1.0000 mg | ORAL_TABLET | Freq: Every day | ORAL | Status: DC

## 2011-12-27 NOTE — Telephone Encounter (Signed)
Pt called request refill for Anastrozole. Pt will run out prior to Next MD visit. Pt uses [facility] out of [place] however only has 6 pills left. Due to mail order taking longer pt may run out and would like medication to be sent to [facility] in . Pt has also requested refill for Anastrozole be sent to mail order as a back up. Discussed with pt I will review with MD.  Per MD refill rx for Anastrozole x1. Pt to be seen on 06/14 Informed pt Refill to be sent to [facility] per her request. Pt verbalized understanding.

## 2012-01-09 ENCOUNTER — Other Ambulatory Visit: Payer: Self-pay | Admitting: *Deleted

## 2012-01-09 DIAGNOSIS — C50919 Malignant neoplasm of unspecified site of unspecified female breast: Secondary | ICD-10-CM

## 2012-01-09 MED ORDER — ANASTROZOLE 1 MG PO TABS: 1.0000 mg | ORAL_TABLET | Freq: Every day | ORAL | Status: DC

## 2012-01-10 ENCOUNTER — Other Ambulatory Visit: Payer: Self-pay | Admitting: Medical Oncology

## 2012-01-13 ENCOUNTER — Ambulatory Visit (HOSPITAL_BASED_OUTPATIENT_CLINIC_OR_DEPARTMENT_OTHER): Payer: Medicare Other | Admitting: Lab

## 2012-01-13 ENCOUNTER — Other Ambulatory Visit: Payer: Medicare Other | Admitting: Lab

## 2012-01-13 ENCOUNTER — Telehealth: Payer: Self-pay | Admitting: *Deleted

## 2012-01-13 ENCOUNTER — Ambulatory Visit (HOSPITAL_BASED_OUTPATIENT_CLINIC_OR_DEPARTMENT_OTHER): Payer: Medicare Other | Admitting: Oncology

## 2012-01-13 ENCOUNTER — Encounter: Payer: Self-pay | Admitting: Oncology

## 2012-01-13 VITALS — BP 154/57 | HR 52 | Temp 98.3°F | Ht 63.0 in | Wt 112.9 lb

## 2012-01-13 DIAGNOSIS — C50919 Malignant neoplasm of unspecified site of unspecified female breast: Secondary | ICD-10-CM

## 2012-01-13 DIAGNOSIS — Z170 Estrogen receptor positive status [ER+]: Secondary | ICD-10-CM

## 2012-01-13 LAB — COMPREHENSIVE METABOLIC PANEL
ALT: 24 U/L (ref 0–35)
Albumin: 4.2 g/dL (ref 3.5–5.2)
CO2: 30 mEq/L (ref 19–32)
Potassium: 3.7 mEq/L (ref 3.5–5.3)
Sodium: 140 mEq/L (ref 135–145)
Total Bilirubin: 0.4 mg/dL (ref 0.3–1.2)
Total Protein: 8.4 g/dL — ABNORMAL HIGH (ref 6.0–8.3)

## 2012-01-13 LAB — CBC WITH DIFFERENTIAL/PLATELET
BASO%: 1.4 % (ref 0.0–2.0)
LYMPH%: 22.6 % (ref 14.0–49.7)
MCHC: 33.1 g/dL (ref 31.5–36.0)
MCV: 91.4 fL (ref 79.5–101.0)
MONO#: 0.6 10*3/uL (ref 0.1–0.9)
MONO%: 10.8 % (ref 0.0–14.0)
Platelets: 205 10*3/uL (ref 145–400)
RBC: 4.15 10*6/uL (ref 3.70–5.45)
WBC: 5.6 10*3/uL (ref 3.9–10.3)

## 2012-01-13 MED ORDER — ANASTROZOLE 1 MG PO TABS: 1.0000 mg | ORAL_TABLET | Freq: Every day | ORAL | Status: DC

## 2012-01-13 NOTE — Progress Notes (Signed)
OFFICE PROGRESS NOTE  CC  [name], [name], MD [address], [place] [address] [place] [address] Dr. Glenna Fellows Dr. Antony Blackbird  DIAGNOSIS: 78 year old female with 0.48 cm invasive ductal carcinoma grade 3 with associated high-grade DCIS. Patient is status post lumpectomy on 02/21/2011. Tumor was ER +99% PR +21% Ki-67 62% and HER-2/neu negative.  PRIOR THERAPY:   #1 patient underwent a lumpectomy on 02/21/2011 40.48 cm grade 3 invasive ductal carcinoma ER/PR positive HER-2/neu negative.  #2 she completed radiation therapy to the left breast on 05/21/2011.   #3 she was then begun on Arimidex 1 mg daily starting 06/03/2011. a total of 5 years of therapy is planned.  CURRENT THERAPY: Arimidex 1 mg daily  INTERVAL HISTORY: Tammie [name] 78 y.o. female returns for Followup visit today. She was last seen by me in November 2012. Clinically she seems to be doing well. She denies any fevers chills night sweats headaches she has no shortness of breath no chest pains or palpitations she does have some myalgias and arthralgias. She denies any vaginal bleeding. Vaginal discharge. No skin changes.The 10 point review of systems is negative.  MEDICAL HISTORY: Past Medical History  Diagnosis Date  . Breast lump     left  . Depression   . Hyperlipidemia   . Hypertension   . GERD (gastroesophageal reflux disease)   . Cancer     breast  . Breast cancer T1aN0 S/P lumpectomy SLN Bx 01/2011 03/25/2011  . Myocardial infarction 1999    mi  . Bilateral carotid artery stenosis     hx  . History of ETOH abuse     member AA 22.5 years  . Thyroid disease     hypo  . Lumbar herniated disc     ALLERGIES:   has no known allergies.  MEDICATIONS:  Current Outpatient Prescriptions  Medication Sig Dispense Refill  . amLODipine (NORVASC) 10 MG tablet Take 10 mg by mouth daily.        [name] anastrozole (ARIMIDEX) 1 MG tablet Take 1 tablet (1 mg total) by mouth daily.  90 tablet  0  . benazepril  (LOTENSIN) 20 MG tablet Take 20 mg by mouth daily. Per patient taking 1/2 tablet per day.      . Calcium Carbonate (CALTRATE 600 PO) Take by mouth 2 (two) times daily.        . clopidogrel (PLAVIX) 75 MG tablet Take 75 mg by mouth daily.        . cyclobenzaprine (FLEXERIL) 5 MG tablet Take 5 mg by mouth daily.      [name] FLUoxetine (PROZAC) 20 MG tablet Take 20 mg by mouth daily.        . furosemide (LASIX) 40 MG tablet Take 40 mg by mouth 2 (two) times daily.        [name] levothyroxine (SYNTHROID, LEVOTHROID) 100 MCG tablet Daily.      . Multiple Vitamins-Minerals (CENTRUM SILVER PO) Take by mouth daily.      . simvastatin (ZOCOR) 20 MG tablet Take 20 mg by mouth daily.        SURGICAL HISTORY:  Past Surgical History  Procedure Date  . Breast lumpectomy 2012  . Breast biopsy 200, 88  . Breast biopsy 1985/2000    2 BENIGN LEFT BREAST BXS    REVIEW OF SYSTEMS:  Pertinent items are noted in HPI.   PHYSICAL EXAMINATION: General appearance: alert, cooperative and appears stated age Lymph nodes: Cervical, supraclavicular, and axillary nodes normal. Resp: clear to  auscultation bilaterally and normal percussion bilaterally Back: symmetric, no curvature. ROM normal. No CVA tenderness. Cardio: regular rate and rhythm, S1, S2 normal, no murmur, click, rub or gallop GI: soft, non-tender; bowel sounds normal; no masses,  no organomegaly Extremities: extremities normal, atraumatic, no cyanosis or edema Neurologic: Grossly normal Bilateral breast examination: is performed left in breast incisional scar looks well healed there is no nodularity. Right breast no masses or nipple discharge.  ECOG PERFORMANCE STATUS: 1 - Symptomatic but completely ambulatory  Blood pressure 154/57, pulse 52, temperature 98.3 F (36.8 C), height 5\' 3"  (1.6 m), weight 112 lb 14.4 oz (51.211 kg).  LABORATORY DATA: Lab Results  Component Value Date   WBC 5.4 06/03/2011   HGB 12.8 06/03/2011   HCT 39.5 06/03/2011   MCV 93.6  06/03/2011   PLT 184 06/03/2011      Chemistry      Component Value Date/Time   NA 141 06/03/2011 1054   K 3.9 06/03/2011 1054   CL 102 06/03/2011 1054   CO2 27 06/03/2011 1054   BUN 20 06/03/2011 1054   CREATININE 1.26* 06/03/2011 1054      Component Value Date/Time   CALCIUM 10.0 06/03/2011 1054   ALKPHOS 59 06/03/2011 1054   AST 24 06/03/2011 1054   ALT 20 06/03/2011 1054   BILITOT 0.5 06/03/2011 1054       RADIOGRAPHIC STUDIES:  Mm Digital Diagnostic Bilat  12/23/2011  *RADIOLOGY REPORT*  Clinical Data:  Left lumpectomy for breast cancer in 2012.  Initial post lumpectomy exam.  DIGITAL DIAGNOSTIC BILATERAL MAMMOGRAM Mammographic images were processed with CAD.  Comparison:  Prior exams  Findings:  Left lumpectomy changes are noted. The breast parenchyma demonstrates scattered fibroglandular densities.  No suspicious mass, calcification, or architectural distortion is seen.  IMPRESSION: No evidence for malignancy.  Expected left lumpectomy changes. Diagnostic mammography is recommended in 1 year. Findings and recommendations discussed with the patient and provided in written form at the time of the exam.  BI-RADS CATEGORY 2:  Benign finding(s).  Original Report Authenticated By: Harrel Lemon, M.D.    ASSESSMENT: 78 year old female with stage I name invasive ductal carcinoma with associated DCIS. Originally diagnosed in July 2012. Patient is status post lumpectomy followed by radiation therapy which he completed on 05/21/2011. She was then begun on Arimidex 1 mg daily beginning November 2012. Overall she is doing well and she is without any evidence of disease or any other complaints.   PLAN: Patient will continue taking the Arimidex on a daily basis. A total of A total of 5 years of therapy is planned. She understands the benefits as well as the risks. She knows to call with any problems. I will plan on seeing her in 6 months time or sooner if need arises.   All questions were  answered. The patient knows to call the clinic with any problems, questions or concerns. We can certainly see the patient much sooner if necessary.  I spent 25 minutes counseling the patient face to face. The total time spent in the appointment was 30 minutes.   Drue Second, MD Medical/Oncology [facility] [phone] (beeper) [phone] (Office)  01/13/2012, 9:50 AM

## 2012-01-13 NOTE — Patient Instructions (Addendum)
1. Continue taking the arimidex as you are  2. I will see you back in 6 months with blood work  3. Please call with any questions or concerns or if you feel you need to be seen sooner

## 2012-01-13 NOTE — Telephone Encounter (Signed)
gave patient appointment for 08-06-2012 starting at 10:00am sent patient back to the lab on 01-13-2012 printed out calendar and gave to the patient

## 2012-02-22 ENCOUNTER — Encounter: Payer: Self-pay | Admitting: Neurosurgery

## 2012-02-23 ENCOUNTER — Ambulatory Visit (INDEPENDENT_AMBULATORY_CARE_PROVIDER_SITE_OTHER): Payer: Medicare Other | Admitting: Neurosurgery

## 2012-02-23 ENCOUNTER — Encounter: Payer: Self-pay | Admitting: Neurosurgery

## 2012-02-23 ENCOUNTER — Other Ambulatory Visit (INDEPENDENT_AMBULATORY_CARE_PROVIDER_SITE_OTHER): Payer: Medicare Other | Admitting: *Deleted

## 2012-02-23 VITALS — BP 132/56 | HR 50 | Resp 16 | Ht 62.0 in | Wt 114.8 lb

## 2012-02-23 DIAGNOSIS — I6529 Occlusion and stenosis of unspecified carotid artery: Secondary | ICD-10-CM

## 2012-02-23 NOTE — Progress Notes (Signed)
[facility] Carotid Office Note  CC: Annual carotid duplex for surveillance Referring Physician: Fields  History of Present Illness: 78 year old female patient of Dr. Darrick Penna followed for known carotid stenosis without intervention. The patient denies signs or symptoms of CVA, TIA, amaurosis fugax or any neural deficit. The patient also denies any new medical diagnoses or recent surgery.  Past Medical History  Diagnosis Date  . Breast lump     left  . Depression   . Hyperlipidemia   . Hypertension   . GERD (gastroesophageal reflux disease)   . Cancer     breast  . Breast cancer T1aN0 S/P lumpectomy SLN Bx 01/2011 03/25/2011  . Myocardial infarction 1999    mi  . Bilateral carotid artery stenosis     hx  . History of ETOH abuse     member AA 22.5 years  . Thyroid disease     hypo  . Lumbar herniated disc     ROS: [x]  Positive   [ ]  Denies    General: [ ]  Weight loss, [ ]  Fever, [ ]  chills Neurologic: [ ]  Dizziness, [ ]  Blackouts, [ ]  Seizure [ ]  Stroke, [ ]  "Mini stroke", [ ]  Slurred speech, [ ]  Temporary blindness; [ ]  weakness in arms or legs, [ ]  Hoarseness Cardiac: [ ]  Chest pain/pressure, [ ]  Shortness of breath at rest [ ]  Shortness of breath with exertion, [ ]  Atrial fibrillation or irregular heartbeat Vascular: [ ]  Pain in legs with walking, [ ]  Pain in legs at rest, [ ]  Pain in legs at night,  [ ]  Non-healing ulcer, [ ]  Blood clot in vein/DVT,   Pulmonary: [ ]  Home oxygen, [ ]  Productive cough, [ ]  Coughing up blood, [ ]  Asthma,  [ ]  Wheezing Musculoskeletal:  [ ]  Arthritis, [ ]  Low back pain, [ ]  Joint pain Hematologic: [ ]  Easy Bruising, [ ]  Anemia; [ ]  Hepatitis Gastrointestinal: [ ]  Blood in stool, [ ]  Gastroesophageal Reflux/heartburn, [ ]  Trouble swallowing Urinary: [ ]  chronic Kidney disease, [ ]  on HD - [ ]  MWF or [ ]  TTHS, [ ]  Burning with urination, [ ]  Difficulty urinating Skin: [ ]  Rashes, [ ]  Wounds Psychological: [ ]   Anxiety, [ ]  Depression   Social History History  Substance Use Topics  . Smoking status: Former Smoker    Types: Cigarettes    Quit date: 09/29/2004  . Smokeless tobacco: Never Used   Comment: 2007- quit  . Alcohol Use: No    Family History Family History  Problem Relation Age of Onset  . Breast cancer Paternal Aunt   . Heart attack Mother   . Hypertension Mother   . Hyperlipidemia Mother   . Heart attack Father   . Hypertension Father   . Hyperlipidemia Father     No Known Allergies  Current Outpatient Prescriptions  Medication Sig Dispense Refill  . amLODipine (NORVASC) 10 MG tablet Take 10 mg by mouth daily.        [name] anastrozole (ARIMIDEX) 1 MG tablet Take 1 tablet (1 mg total) by mouth daily.  90 tablet  12  . benazepril (LOTENSIN) 20 MG tablet Take 40 mg by mouth daily. Per patient taking 1/2 tablet per day.      . Calcium Carbonate (CALTRATE 600 PO) Take by mouth 2 (two) times daily.        . clopidogrel (PLAVIX) 75 MG tablet Take 75 mg by mouth daily.        [name]  FLUoxetine (PROZAC) 20 MG tablet Take 20 mg by mouth daily.        . furosemide (LASIX) 40 MG tablet Take 40 mg by mouth 2 (two) times daily.        [name] levothyroxine (SYNTHROID, LEVOTHROID) 100 MCG tablet Daily.      . Multiple Vitamins-Minerals (CENTRUM SILVER PO) Take by mouth daily.      . simvastatin (ZOCOR) 20 MG tablet Take 20 mg by mouth daily.      . cyclobenzaprine (FLEXERIL) 5 MG tablet Take 5 mg by mouth daily.        Physical Examination  Filed Vitals:   02/23/12 1538  BP: 132/56  Pulse: 50  Resp:     Body mass index is 21.00 kg/(m^2).  General:  WDWN in NAD Gait: Normal HEENT: WNL Eyes: Pupils equal Pulmonary: normal non-labored breathing , without Rales, rhonchi,  wheezing Cardiac: RRR, without  Murmurs, rubs or gallops; Abdomen: soft, NT, no masses Skin: no rashes, ulcers noted  Vascular Exam Pulses: 3+ radial pulses bilaterally Carotid bruits are heard  bilaterally Extremities without ischemic changes, no Gangrene , no cellulitis; no open wounds;  Musculoskeletal: no muscle wasting or atrophy   Neurologic: A&O X 3; Appropriate Affect ; SENSATION: normal; MOTOR FUNCTION:  moving all extremities equally. Speech is fluent/normal  Non-Invasive Vascular Imaging CAROTID DUPLEX 02/23/2012  Right ICA 1-39% stenosis Left ICA 40 - 59 % stenosis   ASSESSMENT/PLAN: Asymptomatic patient with a history of moderate stenosis on the left mild stenosis on the right. The patient was read as 40-59% on the left day but has been in the 60-79% range. Therefore we will monitor her with six-month surveillance. She is in agreement with this plan, her questions were encouraged and answered. The patient knows the signs and symptoms of CVA and knows to report to the nearest emergency department should that occur.  Lauree Chandler ANP   Clinic MD: Darrick Penna

## 2012-03-01 ENCOUNTER — Ambulatory Visit: Payer: Self-pay | Admitting: Vascular Surgery

## 2012-03-01 ENCOUNTER — Other Ambulatory Visit: Payer: Self-pay

## 2012-03-01 NOTE — Procedures (Unsigned)
CAROTID DUPLEX EXAM  INDICATION:  Carotid stenosis.  HISTORY: Diabetes:  No. Cardiac:  No. Hypertension:  Yes. Smoking:  Previous. Previous Surgery:  No. CV History:  Currently asymptomatic. Amaurosis Fugax No, Paresthesias No, Hemiparesis No                                      RIGHT             LEFT Brachial systolic pressure:         145               145 Brachial Doppler waveforms:         Normal            Normal Vertebral direction of flow:        Antegrade         Antegrade DUPLEX VELOCITIES (cm/sec) CCA peak systolic                   73                71 ECA peak systolic                   405               370 ICA peak systolic                   140               214 ICA end diastolic                   26                54 PLAQUE MORPHOLOGY:                  Calcific          Calcific PLAQUE AMOUNT:                      Moderate          Moderate PLAQUE LOCATION:                    CCA, ICA, ECA     CCA, ICA, ECA  IMPRESSION: 1. Right internal carotid artery velocity suggests 1-39% stenosis     (high end of range). 2. Left internal carotid artery velocity suggests 40-59% stenosis     (high end of range). 3. Bilateral external carotid artery stenosis noted. 4. Velocities may be underestimated bilaterally due to calcific plaque     with acoustic shadowing making Doppler interrogation difficult.  ___________________________________________ [name], MD  EM/MEDQ  D:  02/24/2012  T:  02/24/2012  Job:  [id number]

## 2012-04-06 ENCOUNTER — Encounter (INDEPENDENT_AMBULATORY_CARE_PROVIDER_SITE_OTHER): Payer: Self-pay | Admitting: General Surgery

## 2012-04-06 ENCOUNTER — Ambulatory Visit (INDEPENDENT_AMBULATORY_CARE_PROVIDER_SITE_OTHER): Payer: Medicare Other | Admitting: General Surgery

## 2012-04-06 VITALS — BP 130/68 | HR 72 | Temp 97.6°F | Resp 14 | Ht 63.0 in | Wt 113.1 lb

## 2012-04-06 DIAGNOSIS — C50919 Malignant neoplasm of unspecified site of unspecified female breast: Secondary | ICD-10-CM

## 2012-04-06 NOTE — Progress Notes (Signed)
Chief complaint: Followup breast cancer.   History: Patient returns for followup for her T1aN0 ER-positive left breast cancer status post lumpectomy, radiation, and now on adjuvant anastrozole. Surgery date was July 2012. She reports no problems since her last visit related to her breast. Specifically no lumps, skin changes, nipple discharge or unusual pain. She's having a lot of problems with her lower back this has been thoroughly evaluated with imaging it is related to herniated disc. She is tolerating her anastrozole without apparent side effects.  Exam: Gen.: Thin female otherwise well appearing Lymph nodes: No cervical, supraclavicular, or axillary nodes palpable Lungs: Clear equal breath sounds bilaterally Cardiovascular: Regular rate and rhythm without murmur no edema Breasts: Well-healed lumpectomy site left breast without thickening or mass. No other masses palpable in either breast. The skin changes or nipple changes.  Imaging:mammogram May 2013 shows just postlumpectomy changes with no suspicious masses  Assessment and plan: Doing well without Recurrent breast cancer or complications for treatment. Return in 6 months.

## 2012-06-15 ENCOUNTER — Other Ambulatory Visit: Payer: Self-pay | Admitting: *Deleted

## 2012-06-15 DIAGNOSIS — C50919 Malignant neoplasm of unspecified site of unspecified female breast: Secondary | ICD-10-CM

## 2012-06-15 MED ORDER — ANASTROZOLE 1 MG PO TABS: 1.0000 mg | ORAL_TABLET | Freq: Every day | ORAL | Status: DC

## 2012-07-05 ENCOUNTER — Other Ambulatory Visit: Payer: Medicare Other

## 2012-07-05 ENCOUNTER — Ambulatory Visit: Payer: Medicare Other | Admitting: Vascular Surgery

## 2012-07-09 ENCOUNTER — Telehealth: Payer: Self-pay | Admitting: Medical Oncology

## 2012-07-09 NOTE — Telephone Encounter (Signed)
Patient called in requesting refill of anastrozole 1mg  tab. Will review with MD.

## 2012-07-09 NOTE — Telephone Encounter (Signed)
Ok to refill #90/12 refills

## 2012-07-10 ENCOUNTER — Other Ambulatory Visit: Payer: Self-pay | Admitting: Medical Oncology

## 2012-07-10 ENCOUNTER — Telehealth: Payer: Self-pay | Admitting: Medical Oncology

## 2012-07-10 DIAGNOSIS — C50919 Malignant neoplasm of unspecified site of unspecified female breast: Secondary | ICD-10-CM

## 2012-07-10 MED ORDER — ANASTROZOLE 1 MG PO TABS: 1.0000 mg | ORAL_TABLET | Freq: Every day | ORAL | Status: DC

## 2012-07-10 NOTE — Telephone Encounter (Signed)
Per MD, prescription refilled #90/12 refills. Per patient's request faxed to [facility] @ [phone]. Called pt LVMOM informing her that prescription has been faxed and should she have any questions to please call our office.

## 2012-07-10 NOTE — Telephone Encounter (Signed)
Re-faxed prescription for anastrozole to different fax number at [phone] per [facility], d/t listed fax number incorrect.

## 2012-08-06 ENCOUNTER — Telehealth: Payer: Self-pay | Admitting: Oncology

## 2012-08-06 ENCOUNTER — Other Ambulatory Visit (HOSPITAL_BASED_OUTPATIENT_CLINIC_OR_DEPARTMENT_OTHER): Payer: Medicare Other | Admitting: Lab

## 2012-08-06 ENCOUNTER — Ambulatory Visit (HOSPITAL_BASED_OUTPATIENT_CLINIC_OR_DEPARTMENT_OTHER): Payer: Medicare Other | Admitting: Oncology

## 2012-08-06 ENCOUNTER — Encounter: Payer: Self-pay | Admitting: Oncology

## 2012-08-06 VITALS — BP 158/63 | HR 50 | Temp 97.2°F | Resp 20 | Ht 63.0 in | Wt 118.6 lb

## 2012-08-06 DIAGNOSIS — C50219 Malignant neoplasm of upper-inner quadrant of unspecified female breast: Secondary | ICD-10-CM

## 2012-08-06 DIAGNOSIS — Z170 Estrogen receptor positive status [ER+]: Secondary | ICD-10-CM

## 2012-08-06 DIAGNOSIS — C50919 Malignant neoplasm of unspecified site of unspecified female breast: Secondary | ICD-10-CM

## 2012-08-06 LAB — CBC WITH DIFFERENTIAL/PLATELET
BASO%: 1.4 % (ref 0.0–2.0)
MCHC: 33.7 g/dL (ref 31.5–36.0)
MONO#: 0.6 10*3/uL (ref 0.1–0.9)
RBC: 4.23 10*6/uL (ref 3.70–5.45)
WBC: 6.4 10*3/uL (ref 3.9–10.3)
lymph#: 1.6 10*3/uL (ref 0.9–3.3)

## 2012-08-06 LAB — COMPREHENSIVE METABOLIC PANEL (CC13)
ALT: 19 U/L (ref 0–55)
CO2: 29 mEq/L (ref 22–29)
Calcium: 10.7 mg/dL — ABNORMAL HIGH (ref 8.4–10.4)
Chloride: 103 mEq/L (ref 98–107)
Sodium: 142 mEq/L (ref 136–145)
Total Protein: 8.1 g/dL (ref 6.4–8.3)

## 2012-08-06 NOTE — Patient Instructions (Addendum)
Continue arimidex daily  I will see you back in 6 months   

## 2012-08-06 NOTE — Progress Notes (Signed)
OFFICE PROGRESS NOTE  CC  [name], [name], MD [address] [place] [address] [place] [address] Dr. Glenna Fellows Dr. 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 Patient is status post lumpectomy on 02/21/2011. Tumor was ER +99% PR +21% Ki-67 62% and HER-2/neu negative.  PRIOR THERAPY:   #1 patient underwent a lumpectomy on 02/21/2011 40.48 cm grade 3 invasive ductal carcinoma ER/PR positive HER-2/neu negative.  #2 she completed radiation therapy to the left breast on 05/21/2011.   #3 she was then begun on Arimidex 1 mg daily starting 06/03/2011. a total of 5 years of therapy is planned.  CURRENT THERAPY: Arimidex 1 mg daily  INTERVAL HISTORY: Tammie [name] 79 y.o. female returns for Followup visit today. She was last seen by me in November 2012. Clinically she seems to be doing well. She denies any fevers chills night sweats headaches she has no shortness of breath no chest pains or palpitations she does have some myalgias and arthralgias. She denies any vaginal bleeding. Vaginal discharge. No skin changes.The 10 point review of systems is negative.  MEDICAL HISTORY: Past Medical History  Diagnosis Date  . Breast lump     left  . Depression   . Hyperlipidemia   . Hypertension   . GERD (gastroesophageal reflux disease)   . Cancer     breast  . Breast cancer T1aN0 S/P lumpectomy SLN Bx 01/2011 03/25/2011  . Myocardial infarction 1999    mi  . Bilateral carotid artery stenosis     hx  . History of ETOH abuse     member AA 22.5 years  . Thyroid disease     hypo  . Lumbar herniated disc     ALLERGIES:   has no known allergies.  MEDICATIONS:  Current Outpatient Prescriptions  Medication Sig Dispense Refill  . amLODipine (NORVASC) 10 MG tablet Take 10 mg by mouth daily.        [name] anastrozole (ARIMIDEX) 1 MG tablet Take 1 tablet (1 mg total) by mouth daily.  90 tablet  12  . aspirin 81 MG  tablet Take 81 mg by mouth daily.      . benazepril (LOTENSIN) 20 MG tablet Take 40 mg by mouth daily. Per patient taking 1/2 tablet per day.      . Calcium Carbonate (CALTRATE 600 PO) Take by mouth 2 (two) times daily.        . clopidogrel (PLAVIX) 75 MG tablet Take 75 mg by mouth daily.        . cyclobenzaprine (FLEXERIL) 5 MG tablet Take 5 mg by mouth daily.      [name] FLUoxetine (PROZAC) 20 MG tablet Take 20 mg by mouth daily.        . furosemide (LASIX) 40 MG tablet Take 40 mg by mouth daily.       [name] levothyroxine (SYNTHROID, LEVOTHROID) 88 MCG tablet Take 88 mcg by mouth daily.      . Multiple Vitamins-Minerals (CENTRUM SILVER PO) Take by mouth daily.      . simvastatin (ZOCOR) 20 MG tablet Take 20 mg by mouth daily.        SURGICAL HISTORY:  Past Surgical History  Procedure Date  . Breast lumpectomy 2012  . Breast biopsy 200, 88  . Breast biopsy 1985/2000    2 BENIGN LEFT BREAST BXS    REVIEW OF SYSTEMS:  Pertinent items are noted in HPI.   PHYSICAL EXAMINATION: General appearance:  alert, cooperative and appears stated age Lymph nodes: Cervical, supraclavicular, and axillary nodes normal. Resp: clear to auscultation bilaterally and normal percussion bilaterally Back: symmetric, no curvature. ROM normal. No CVA tenderness. Cardio: regular rate and rhythm, S1, S2 normal, no murmur, click, rub or gallop GI: soft, non-tender; bowel sounds normal; no masses,  no organomegaly Extremities: extremities normal, atraumatic, no cyanosis or edema Neurologic: Grossly normal Bilateral breast examination: is performed left in breast incisional scar looks well healed there is no nodularity. Right breast no masses or nipple discharge.  ECOG PERFORMANCE STATUS: 1 - Symptomatic but completely ambulatory  Blood pressure 158/63, pulse 50, temperature 97.2 F (36.2 C), resp. rate 20, height 5\' 3"  (1.6 m), weight 118 lb 9.6 oz (53.797 kg).  LABORATORY DATA: Lab Results  Component Value Date    WBC 6.4 08/06/2012   HGB 13.2 08/06/2012   HCT 39.1 08/06/2012   MCV 92.4 08/06/2012   PLT 175 08/06/2012      Chemistry      Component Value Date/Time   NA 140 01/13/2012 1038   K 3.7 01/13/2012 1038   CL 99 01/13/2012 1038   CO2 30 01/13/2012 1038   BUN 20 01/13/2012 1038   CREATININE 1.16* 01/13/2012 1038      Component Value Date/Time   CALCIUM 10.3 01/13/2012 1038   ALKPHOS 66 01/13/2012 1038   AST 22 01/13/2012 1038   ALT 24 01/13/2012 1038   BILITOT 0.4 01/13/2012 1038       RADIOGRAPHIC STUDIES:  Mm Digital Diagnostic Bilat  12/23/2011  *RADIOLOGY REPORT*  Clinical Data:  Left lumpectomy for breast cancer in 2012.  Initial post lumpectomy exam.  DIGITAL DIAGNOSTIC BILATERAL MAMMOGRAM Mammographic images were processed with CAD.  Comparison:  Prior exams  Findings:  Left lumpectomy changes are noted. The breast parenchyma demonstrates scattered fibroglandular densities.  No suspicious mass, calcification, or architectural distortion is seen.  IMPRESSION: No evidence for malignancy.  Expected left lumpectomy changes. Diagnostic mammography is recommended in 1 year. Findings and recommendations discussed with the patient and provided in written form at the time of the exam.  BI-RADS CATEGORY 2:  Benign finding(s).  Original Report Authenticated By: Harrel Lemon, M.D.    ASSESSMENT: 79 year old female with stage I  invasive ductal carcinoma with associated DCIS. Originally diagnosed in July 2012. Patient is status post lumpectomy followed by radiation therapy which he completed on 05/21/2011. She was then begun on Arimidex 1 mg daily beginning November 2012. Overall she is doing well and she is without any evidence of disease or any other complaints.   PLAN: Patient will continue taking the Arimidex on a daily basis. A total of A total of 5 years of therapy is planned. She understands the benefits as well as the risks. She knows to call with any problems. I will plan on seeing her in 6 months  time or sooner if need arises.   All questions were answered. The patient knows to call the clinic with any problems, questions or concerns. We can certainly see the patient much sooner if necessary.  I spent 25 minutes counseling the patient face to face. The total time spent in the appointment was 30 minutes.   Drue Second, MD Medical/Oncology [facility] [phone] (beeper) [phone] (Office)  08/06/2012, 11:09 AM

## 2012-08-06 NOTE — Telephone Encounter (Signed)
gv pt appt schedule for June 2014 

## 2012-08-20 ENCOUNTER — Other Ambulatory Visit: Payer: Self-pay | Admitting: *Deleted

## 2012-08-20 DIAGNOSIS — I6529 Occlusion and stenosis of unspecified carotid artery: Secondary | ICD-10-CM

## 2012-08-29 ENCOUNTER — Encounter: Payer: Self-pay | Admitting: Neurosurgery

## 2012-08-30 ENCOUNTER — Encounter: Payer: Self-pay | Admitting: Neurosurgery

## 2012-08-30 ENCOUNTER — Other Ambulatory Visit: Payer: Self-pay | Admitting: *Deleted

## 2012-08-30 ENCOUNTER — Other Ambulatory Visit (INDEPENDENT_AMBULATORY_CARE_PROVIDER_SITE_OTHER): Payer: Medicare Other | Admitting: *Deleted

## 2012-08-30 ENCOUNTER — Ambulatory Visit (INDEPENDENT_AMBULATORY_CARE_PROVIDER_SITE_OTHER): Payer: Medicare Other | Admitting: Neurosurgery

## 2012-08-30 VITALS — BP 132/58 | HR 51 | Resp 16 | Ht 62.0 in | Wt 118.0 lb

## 2012-08-30 DIAGNOSIS — I6529 Occlusion and stenosis of unspecified carotid artery: Secondary | ICD-10-CM

## 2012-08-30 NOTE — Progress Notes (Signed)
[facility] Carotid Office Note  CC: Carotid surveillance Referring Physician: Fields  History of Present Illness: 79 year old female patient of Dr. Darrick Penna followed for known carotid stenosis. The patient states she quit smoking about 7 years ago and feels like it has helped her general health greatly. The patient denies any signs or symptoms of CVA, TIA, amaurosis fugax or any neural deficit.  Past Medical History  Diagnosis Date  . Breast lump     left  . Depression   . Hyperlipidemia   . Hypertension   . GERD (gastroesophageal reflux disease)   . Cancer     breast  . Breast cancer T1aN0 S/P lumpectomy SLN Bx 01/2011 03/25/2011  . Myocardial infarction 1999    mi  . Bilateral carotid artery stenosis     hx  . History of ETOH abuse     member AA 22.5 years  . Thyroid disease     hypo  . Lumbar herniated disc     ROS: [x]  Positive   [ ]  Denies    General: [ ]  Weight loss, [ ]  Fever, [ ]  chills Neurologic: [ ]  Dizziness, [ ]  Blackouts, [ ]  Seizure [ ]  Stroke, [ ]  "Mini stroke", [ ]  Slurred speech, [ ]  Temporary blindness; [ ]  weakness in arms or legs, [ ]  Hoarseness Cardiac: [ ]  Chest pain/pressure, [ ]  Shortness of breath at rest [ ]  Shortness of breath with exertion, [ ]  Atrial fibrillation or irregular heartbeat Vascular: [ ]  Pain in legs with walking, [ ]  Pain in legs at rest, [ ]  Pain in legs at night,  [ ]  Non-healing ulcer, [ ]  Blood clot in vein/DVT,   Pulmonary: [ ]  Home oxygen, [ ]  Productive cough, [ ]  Coughing up blood, [ ]  Asthma,  [ ]  Wheezing Musculoskeletal:  [ ]  Arthritis, [ ]  Low back pain, [ ]  Joint pain Hematologic: [ ]  Easy Bruising, [ ]  Anemia; [ ]  Hepatitis Gastrointestinal: [ ]  Blood in stool, [ ]  Gastroesophageal Reflux/heartburn, [ ]  Trouble swallowing Urinary: [ ]  chronic Kidney disease, [ ]  on HD - [ ]  MWF or [ ]  TTHS, [ ]  Burning with urination, [ ]  Difficulty urinating Skin: [ ]  Rashes, [ ]  Wounds Psychological: [ ]   Anxiety, [ ]  Depression   Social History History  Substance Use Topics  . Smoking status: Former Smoker    Types: Cigarettes    Quit date: 09/29/2004  . Smokeless tobacco: Never Used     Comment: 2007- quit  . Alcohol Use: No    Family History Family History  Problem Relation Age of Onset  . Breast cancer Paternal Aunt   . Heart attack Mother   . Hypertension Mother   . Hyperlipidemia Mother   . Heart attack Father   . Hypertension Father   . Hyperlipidemia Father     No Known Allergies  Current Outpatient Prescriptions  Medication Sig Dispense Refill  . amLODipine (NORVASC) 10 MG tablet Take 10 mg by mouth daily.        [name] anastrozole (ARIMIDEX) 1 MG tablet Take 1 tablet (1 mg total) by mouth daily.  90 tablet  12  . aspirin 81 MG tablet Take 81 mg by mouth daily.      . benazepril (LOTENSIN) 20 MG tablet Take 40 mg by mouth daily. Per patient taking 1/2 tablet per day.      . Calcium Carbonate (CALTRATE 600 PO) Take by mouth 2 (two) times daily.        [name]  clopidogrel (PLAVIX) 75 MG tablet Take 75 mg by mouth daily.        . cyclobenzaprine (FLEXERIL) 5 MG tablet Take 5 mg by mouth daily.      [name] FLUoxetine (PROZAC) 20 MG tablet Take 20 mg by mouth daily.        . furosemide (LASIX) 40 MG tablet Take 40 mg by mouth daily.       [name] levothyroxine (SYNTHROID, LEVOTHROID) 88 MCG tablet Take 88 mcg by mouth daily.      . Multiple Vitamins-Minerals (CENTRUM SILVER PO) Take by mouth daily.      . simvastatin (ZOCOR) 20 MG tablet Take 20 mg by mouth daily.        Physical Examination  Filed Vitals:   08/30/12 1355  BP: 132/58  Pulse: 51  Resp:     Body mass index is 21.58 kg/(m^2).  General:  WDWN in NAD Gait: Normal HEENT: WNL Eyes: Pupils equal Pulmonary: normal non-labored breathing , without Rales, rhonchi,  wheezing Cardiac: RRR, without  Murmurs, rubs or gallops; Abdomen: soft, NT, no masses Skin: no rashes, ulcers noted  Vascular Exam Pulses: 3+ radial  pulses bilaterally Carotid bruits: Carotid pulse her on the right with a mild bruit on the left Extremities without ischemic changes, no Gangrene , no cellulitis; no open wounds;  Musculoskeletal: no muscle wasting or atrophy   Neurologic: A&O X 3; Appropriate Affect ; SENSATION: normal; MOTOR FUNCTION:  moving all extremities equally. Speech is fluent/normal  Non-Invasive Vascular Imaging CAROTID DUPLEX 08/30/2012  Right ICA 20 - 39 % stenosis Left ICA 40 - 59 % stenosis   ASSESSMENT/PLAN: Asymptomatic patient will followup in one year with repeat carotid duplex. The patient's questions were encouraged and answered, she is in agreement with this plan.  Lauree Chandler ANP   Clinic MD: Darrick Penna

## 2012-10-25 ENCOUNTER — Ambulatory Visit (INDEPENDENT_AMBULATORY_CARE_PROVIDER_SITE_OTHER): Payer: Medicare Other | Admitting: General Surgery

## 2012-11-19 ENCOUNTER — Other Ambulatory Visit: Payer: Self-pay | Admitting: Oncology

## 2012-11-19 DIAGNOSIS — Z853 Personal history of malignant neoplasm of breast: Secondary | ICD-10-CM

## 2012-11-29 ENCOUNTER — Ambulatory Visit (INDEPENDENT_AMBULATORY_CARE_PROVIDER_SITE_OTHER): Payer: Medicare Other | Admitting: General Surgery

## 2012-11-29 ENCOUNTER — Encounter (INDEPENDENT_AMBULATORY_CARE_PROVIDER_SITE_OTHER): Payer: Self-pay | Admitting: General Surgery

## 2012-11-29 ENCOUNTER — Telehealth (INDEPENDENT_AMBULATORY_CARE_PROVIDER_SITE_OTHER): Payer: Self-pay

## 2012-11-29 VITALS — BP 120/60 | HR 50 | Temp 97.7°F | Resp 14 | Ht 62.0 in | Wt 118.0 lb

## 2012-11-29 DIAGNOSIS — C50919 Malignant neoplasm of unspecified site of unspecified female breast: Secondary | ICD-10-CM

## 2012-11-29 DIAGNOSIS — C50912 Malignant neoplasm of unspecified site of left female breast: Secondary | ICD-10-CM

## 2012-11-29 NOTE — Telephone Encounter (Signed)
Close encounter 

## 2012-11-29 NOTE — Progress Notes (Signed)
Chief complaint: Followup breast cancer.   History: Patient returns for followup for her T1aN0 ER-positive left breast cancer status post lumpectomy, radiation, and now on adjuvant anastrozole. Surgery date was July 2012. She reports no problems since her last visit related to her breast. Specifically no lumps, skin changes, nipple discharge or unusual pain. She's having a lot of problems with her lower back this has been thoroughly evaluated with imaging it is related to herniated disc. She is tolerating her anastrozole without apparent side effects.   Exam:  BP 120/60  Pulse 50  Temp(Src) 97.7 F (36.5 C)  Resp 14  Ht 5\' 2"  (1.575 m)  Wt 118 lb (53.524 kg)  BMI 21.58 kg/m2  Gen.: Thin female otherwise well appearing  Lymph nodes: No cervical, supraclavicular, or axillary nodes palpable  Lungs: Clear equal breath sounds bilaterally  Cardiovascular: Regular rate and rhythm without murmur no edema  Breasts: Well-healed lumpectomy site left breast without thickening or mass. No other masses palpable in either breast. The skin changes or nipple changes.   Imaging:mammogram May 2013 shows just postlumpectomy changes with no suspicious masses.  Repeat mammogram is scheduled for later this month   Assessment and plan:  Doing well without  Recurrent breast cancer or complications for treatment. Return in 6 months.

## 2012-12-18 ENCOUNTER — Other Ambulatory Visit: Payer: Self-pay | Admitting: Oncology

## 2012-12-25 ENCOUNTER — Ambulatory Visit
Admission: RE | Admit: 2012-12-25 | Discharge: 2012-12-25 | Disposition: A | Discharge status: Home / Self Care | Payer: Medicare Other | Source: Ambulatory Visit | Attending: Oncology | Admitting: Oncology

## 2012-12-25 DIAGNOSIS — Z853 Personal history of malignant neoplasm of breast: Secondary | ICD-10-CM

## 2013-01-18 ENCOUNTER — Telehealth: Payer: Self-pay | Admitting: Oncology

## 2013-01-18 ENCOUNTER — Ambulatory Visit (HOSPITAL_BASED_OUTPATIENT_CLINIC_OR_DEPARTMENT_OTHER): Payer: Medicare Other | Admitting: Oncology

## 2013-01-18 ENCOUNTER — Other Ambulatory Visit (HOSPITAL_BASED_OUTPATIENT_CLINIC_OR_DEPARTMENT_OTHER): Payer: Medicare Other | Admitting: Lab

## 2013-01-18 VITALS — BP 121/55 | HR 54 | Temp 97.0°F | Resp 18 | Ht 62.0 in | Wt 119.2 lb

## 2013-01-18 DIAGNOSIS — C50919 Malignant neoplasm of unspecified site of unspecified female breast: Secondary | ICD-10-CM

## 2013-01-18 DIAGNOSIS — M810 Age-related osteoporosis without current pathological fracture: Secondary | ICD-10-CM

## 2013-01-18 DIAGNOSIS — C50912 Malignant neoplasm of unspecified site of left female breast: Secondary | ICD-10-CM

## 2013-01-18 DIAGNOSIS — C50219 Malignant neoplasm of upper-inner quadrant of unspecified female breast: Secondary | ICD-10-CM

## 2013-01-18 LAB — CBC WITH DIFFERENTIAL/PLATELET
BASO%: 1.4 % (ref 0.0–2.0)
EOS%: 1.8 % (ref 0.0–7.0)
HCT: 38.5 % (ref 34.8–46.6)
MCH: 30.7 pg (ref 25.1–34.0)
MCHC: 33.7 g/dL (ref 31.5–36.0)
MCV: 91.3 fL (ref 79.5–101.0)
MONO%: 11.2 % (ref 0.0–14.0)
NEUT%: 62.9 % (ref 38.4–76.8)
RDW: 14.1 % (ref 11.2–14.5)
lymph#: 1.4 10*3/uL (ref 0.9–3.3)

## 2013-01-18 LAB — COMPREHENSIVE METABOLIC PANEL (CC13)
ALT: 25 U/L (ref 0–55)
AST: 26 U/L (ref 5–34)
Albumin: 3.9 g/dL (ref 3.5–5.0)
BUN: 20.4 mg/dL (ref 7.0–26.0)
Calcium: 10.2 mg/dL (ref 8.4–10.4)
Sodium: 140 mEq/L (ref 136–145)

## 2013-01-18 MED ORDER — ANASTROZOLE 1 MG PO TABS: 1.0000 mg | ORAL_TABLET | Freq: Every day | ORAL | Status: DC

## 2013-01-18 NOTE — Progress Notes (Signed)
OFFICE PROGRESS NOTE  CC  [name], [name], MD [address], [place] [address] [place] [address] Dr. Glenna Fellows Dr. 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 Patient is status post lumpectomy on 02/21/2011. Tumor was ER +99% PR +21% Ki-67 62% and HER-2/neu negative.  PRIOR THERAPY:   #1 patient underwent a lumpectomy on 02/21/2011 40.48 cm grade 3 invasive ductal carcinoma ER/PR positive HER-2/neu negative.  #2 she completed radiation therapy to the left breast on 05/21/2011.   #3 she was then begun on Arimidex 1 mg daily starting 06/03/2011. a total of 5 years of therapy is planned.  CURRENT THERAPY: Arimidex 1 mg daily  INTERVAL HISTORY: Tammie [name] 79 y.o. female returns for Followup visit today.Clinically she seems to be doing well. She denies any fevers chills night sweats headaches she has no shortness of breath no chest pains or palpitations she does have some myalgias and arthralgias. She denies any vaginal bleeding. Vaginal discharge. No skin changes.The 10 point review of systems is negative.  MEDICAL HISTORY: Past Medical History  Diagnosis Date  . Breast lump     left  . Depression   . Hyperlipidemia   . Hypertension   . GERD (gastroesophageal reflux disease)   . Cancer     breast  . Breast cancer T1aN0 S/P lumpectomy SLN Bx 01/2011 03/25/2011  . Myocardial infarction 1999    mi  . Bilateral carotid artery stenosis     hx  . History of ETOH abuse     member AA 22.5 years  . Thyroid disease     hypo  . Lumbar herniated disc     ALLERGIES:  has No Known Allergies.  MEDICATIONS:  Current Outpatient Prescriptions  Medication Sig Dispense Refill  . amLODipine (NORVASC) 10 MG tablet Take 10 mg by mouth daily.        [name] anastrozole (ARIMIDEX) 1 MG tablet Take 1 tablet (1 mg total) by mouth daily.  90 tablet  12  . aspirin 81 MG tablet Take 81 mg by mouth daily.      .  benazepril (LOTENSIN) 20 MG tablet Take 40 mg by mouth daily. Per patient taking 1/2 tablet per day.      . Calcium Carbonate (CALTRATE 600 PO) Take by mouth 2 (two) times daily.       . Cholecalciferol (VITAMIN D-3) 1000 UNITS CAPS Take by mouth.      . clopidogrel (PLAVIX) 75 MG tablet Take 75 mg by mouth daily.        [name] FLUoxetine (PROZAC) 20 MG tablet Take 20 mg by mouth daily.        . furosemide (LASIX) 40 MG tablet Take 40 mg by mouth daily.       [name] levothyroxine (SYNTHROID, LEVOTHROID) 88 MCG tablet Take 88 mcg by mouth daily.      . Multiple Vitamins-Minerals (CENTRUM SILVER PO) Take by mouth daily.      . simvastatin (ZOCOR) 20 MG tablet Take 20 mg by mouth daily.      . cyclobenzaprine (FLEXERIL) 5 MG tablet Take 5 mg by mouth daily.       No current facility-administered medications for this visit.    SURGICAL HISTORY:  Past Surgical History  Procedure Laterality Date  . Breast lumpectomy  2012  . Breast biopsy  200, 88  . Breast biopsy  1985/2000    2 BENIGN LEFT BREAST BXS  REVIEW OF SYSTEMS:  Pertinent items are noted in HPI.   PHYSICAL EXAMINATION: General appearance: alert, cooperative and appears stated age Lymph nodes: Cervical, supraclavicular, and axillary nodes normal. Resp: clear to auscultation bilaterally and normal percussion bilaterally Back: symmetric, no curvature. ROM normal. No CVA tenderness. Cardio: regular rate and rhythm, S1, S2 normal, no murmur, click, rub or gallop GI: soft, non-tender; bowel sounds normal; no masses,  no organomegaly Extremities: extremities normal, atraumatic, no cyanosis or edema Neurologic: Grossly normal Bilateral breast examination: is performed left in breast incisional scar looks well healed there is no nodularity. Right breast no masses or nipple discharge.  ECOG PERFORMANCE STATUS: 1 - Symptomatic but completely ambulatory  Blood pressure 121/55, pulse 54, temperature 97 F (36.1 C), temperature source Oral,  resp. rate 18, height 5\' 2"  (1.575 m), weight 119 lb 3.2 oz (54.069 kg).  LABORATORY DATA: Lab Results  Component Value Date   WBC 6.3 01/18/2013   HGB 13.0 01/18/2013   HCT 38.5 01/18/2013   MCV 91.3 01/18/2013   PLT 174 01/18/2013      Chemistry      Component Value Date/Time   NA 140 01/18/2013 0918   NA 140 01/13/2012 1038   K 4.0 01/18/2013 0918   K 3.7 01/13/2012 1038   CL 103 01/18/2013 0918   CL 99 01/13/2012 1038   CO2 28 01/18/2013 0918   CO2 30 01/13/2012 1038   BUN 20.4 01/18/2013 0918   BUN 20 01/13/2012 1038   CREATININE 1.2* 01/18/2013 0918   CREATININE 1.16* 01/13/2012 1038      Component Value Date/Time   CALCIUM 10.2 01/18/2013 0918   CALCIUM 10.3 01/13/2012 1038   ALKPHOS 74 01/18/2013 0918   ALKPHOS 66 01/13/2012 1038   AST 26 01/18/2013 0918   AST 22 01/13/2012 1038   ALT 25 01/18/2013 0918   ALT 24 01/13/2012 1038   BILITOT 0.51 01/18/2013 0918   BILITOT 0.4 01/13/2012 1038       RADIOGRAPHIC STUDIES:  Mm Digital Diagnostic Bilat  12/23/2011  *RADIOLOGY REPORT*  Clinical Data:  Left lumpectomy for breast cancer in 2012.  Initial post lumpectomy exam.  DIGITAL DIAGNOSTIC BILATERAL MAMMOGRAM Mammographic images were processed with CAD.  Comparison:  Prior exams  Findings:  Left lumpectomy changes are noted. The breast parenchyma demonstrates scattered fibroglandular densities.  No suspicious mass, calcification, or architectural distortion is seen.  IMPRESSION: No evidence for malignancy.  Expected left lumpectomy changes. Diagnostic mammography is recommended in 1 year. Findings and recommendations discussed with the patient and provided in written form at the time of the exam.  BI-RADS CATEGORY 2:  Benign finding(s).  Original Report Authenticated By: Harrel Lemon, M.D.    ASSESSMENT: 79 year old female with  #1 stage I  invasive ductal carcinoma with associated DCIS. Originally diagnosed in July 2012. Patient is status post lumpectomy followed by radiation therapy  which he completed on 05/21/2011. She was then begun on Arimidex 1 mg daily beginning November 2012. Overall she is doing well and she is without any evidence of disease or any other complaints.  #2 osteoporosis last bone density in 2011, not on any bisphosphonates  #3 Hypercalcemia better   PLAN: . #1 continue arimidex daily, prescription sent to the pharmacy  #2 bone density scan ordered  #3 return to clinic in 6 months  All questions were answered. The patient knows to call the clinic with any problems, questions or concerns. We can certainly see the patient much sooner if  necessary.  I spent 25 minutes counseling the patient face to face. The total time spent in the appointment was 30 minutes.   Drue Second, MD Medical/Oncology [facility] [phone] (beeper) [phone] (Office)  01/18/2013, 10:51 AM

## 2013-01-18 NOTE — Patient Instructions (Addendum)
Continue arimidex 1 mg daily  I have ordered a bone density to check for bone health (osteoporosis)  I will see you back in 6 months

## 2013-02-07 ENCOUNTER — Ambulatory Visit
Admission: RE | Admit: 2013-02-07 | Discharge: 2013-02-07 | Disposition: A | Discharge status: Home / Self Care | Payer: Self-pay | Source: Ambulatory Visit | Attending: Oncology | Admitting: Oncology

## 2013-02-07 DIAGNOSIS — M810 Age-related osteoporosis without current pathological fracture: Secondary | ICD-10-CM

## 2013-07-03 ENCOUNTER — Other Ambulatory Visit: Payer: Self-pay | Admitting: Emergency Medicine

## 2013-07-03 ENCOUNTER — Ambulatory Visit (HOSPITAL_BASED_OUTPATIENT_CLINIC_OR_DEPARTMENT_OTHER): Payer: Medicare Other | Admitting: Oncology

## 2013-07-03 ENCOUNTER — Other Ambulatory Visit (HOSPITAL_BASED_OUTPATIENT_CLINIC_OR_DEPARTMENT_OTHER): Payer: Medicare Other | Admitting: Lab

## 2013-07-03 ENCOUNTER — Telehealth: Payer: Self-pay | Admitting: Oncology

## 2013-07-03 ENCOUNTER — Encounter: Payer: Self-pay | Admitting: Oncology

## 2013-07-03 VITALS — BP 140/55 | HR 49 | Temp 98.7°F | Resp 18 | Ht 62.0 in | Wt 121.6 lb

## 2013-07-03 DIAGNOSIS — C50912 Malignant neoplasm of unspecified site of left female breast: Secondary | ICD-10-CM

## 2013-07-03 DIAGNOSIS — C50919 Malignant neoplasm of unspecified site of unspecified female breast: Secondary | ICD-10-CM

## 2013-07-03 DIAGNOSIS — C50219 Malignant neoplasm of upper-inner quadrant of unspecified female breast: Secondary | ICD-10-CM

## 2013-07-03 DIAGNOSIS — Z170 Estrogen receptor positive status [ER+]: Secondary | ICD-10-CM

## 2013-07-03 LAB — CBC WITH DIFFERENTIAL/PLATELET
BASO%: 1.1 % (ref 0.0–2.0)
EOS%: 1.9 % (ref 0.0–7.0)
HGB: 13.3 g/dL (ref 11.6–15.9)
MCH: 29.9 pg (ref 25.1–34.0)
MCHC: 32 g/dL (ref 31.5–36.0)
RDW: 13.9 % (ref 11.2–14.5)
WBC: 7 10*3/uL (ref 3.9–10.3)
lymph#: 1.7 10*3/uL (ref 0.9–3.3)

## 2013-07-03 LAB — COMPREHENSIVE METABOLIC PANEL (CC13)
ALT: 19 U/L (ref 0–55)
AST: 23 U/L (ref 5–34)
BUN: 23.3 mg/dL (ref 7.0–26.0)
Calcium: 10.7 mg/dL — ABNORMAL HIGH (ref 8.4–10.4)
Chloride: 103 mEq/L (ref 98–109)
Creatinine: 1.3 mg/dL — ABNORMAL HIGH (ref 0.6–1.1)
Total Bilirubin: 0.5 mg/dL (ref 0.20–1.20)

## 2013-07-03 MED ORDER — ALENDRONATE SODIUM 70 MG PO TABS: 70.0000 mg | ORAL_TABLET | ORAL | Status: DC

## 2013-07-03 MED ORDER — ANASTROZOLE 1 MG PO TABS: 1.0000 mg | ORAL_TABLET | Freq: Every day | ORAL | Status: DC

## 2013-07-03 NOTE — Progress Notes (Signed)
OFFICE PROGRESS NOTE  CC  [name], [name], MD [address] [place] [address] [place] [address] Dr. Glenna Fellows Dr. 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 Patient is status post lumpectomy on 02/21/2011. Tumor was ER +99% PR +21% Ki-67 62% and HER-2/neu negative.  PRIOR THERAPY:   #1 patient underwent a lumpectomy on 02/21/2011 40.48 cm grade 3 invasive ductal carcinoma ER/PR positive HER-2/neu negative.  #2 she completed radiation therapy to the left breast on 05/21/2011.   #3 she was then begun on Arimidex 1 mg daily starting 06/03/2011. a total of 5 years of therapy is planned  #4 osteoporosis on Fosamax now.  CURRENT THERAPY: curative intent Arimidex 1 mg daily  INTERVAL HISTORY: Tammie [name] 79 y.o. female returns for Followup visit today.Clinically she seems to be doing well. She denies any fevers chills night sweats headaches she has no shortness of breath no chest pains or palpitations she does have some myalgias and arthralgias. She denies any vaginal bleeding. Vaginal discharge. No skin changes.The 10 point review of systems is negative.  MEDICAL HISTORY: Past Medical History  Diagnosis Date  . Breast lump     left  . Depression   . Hyperlipidemia   . Hypertension   . GERD (gastroesophageal reflux disease)   . Cancer     breast  . Breast cancer T1aN0 S/P lumpectomy SLN Bx 01/2011 03/25/2011  . Myocardial infarction 1999    mi  . Bilateral carotid artery stenosis     hx  . History of ETOH abuse     member AA 22.5 years  . Thyroid disease     hypo  . Lumbar herniated disc     ALLERGIES:  has No Known Allergies.  MEDICATIONS:  Current Outpatient Prescriptions  Medication Sig Dispense Refill  . amLODipine (NORVASC) 10 MG tablet Take 10 mg by mouth daily.        [name] aspirin 81 MG tablet Take 81 mg by mouth daily.      . benazepril (LOTENSIN) 20 MG tablet Take 40 mg by  mouth daily. Per patient taking 1/2 tablet per day.      . Calcium Carbonate (CALTRATE 600 PO) Take by mouth 2 (two) times daily.       . Cholecalciferol (VITAMIN D-3) 1000 UNITS CAPS Take by mouth.      . clopidogrel (PLAVIX) 75 MG tablet Take 75 mg by mouth daily.        [name] FLUoxetine (PROZAC) 20 MG tablet Take 20 mg by mouth daily.        . furosemide (LASIX) 40 MG tablet Take 40 mg by mouth daily.       [name] levothyroxine (SYNTHROID, LEVOTHROID) 88 MCG tablet Take 88 mcg by mouth daily.      . Multiple Vitamins-Minerals (CENTRUM SILVER PO) Take by mouth daily.      . simvastatin (ZOCOR) 20 MG tablet Take 20 mg by mouth daily.      [name] anastrozole (ARIMIDEX) 1 MG tablet Take 1 tablet (1 mg total) by mouth daily.  90 tablet  12   No current facility-administered medications for this visit.    SURGICAL HISTORY:  Past Surgical History  Procedure Laterality Date  . Breast lumpectomy  2012  . Breast biopsy  200, 88  . Breast biopsy  1985/2000    2 BENIGN LEFT BREAST BXS    REVIEW OF SYSTEMS:  Pertinent items are noted in  HPI.   PHYSICAL EXAMINATION: General appearance: alert, cooperative and appears stated age Lymph nodes: Cervical, supraclavicular, and axillary nodes normal. Resp: clear to auscultation bilaterally and normal percussion bilaterally Back: symmetric, no curvature. ROM normal. No CVA tenderness. Cardio: regular rate and rhythm, S1, S2 normal, no murmur, click, rub or gallop GI: soft, non-tender; bowel sounds normal; no masses,  no organomegaly Extremities: extremities normal, atraumatic, no cyanosis or edema Neurologic: Grossly normal Bilateral breast examination: is performed left in breast incisional scar looks well healed there is no nodularity. Right breast no masses or nipple discharge.  ECOG PERFORMANCE STATUS: 1 - Symptomatic but completely ambulatory  Blood pressure 140/55, pulse 49, temperature 98.7 F (37.1 C), temperature source Oral, resp. rate 18, height 5\' 2"   (1.575 m), weight 121 lb 9 oz (55.14 kg).  LABORATORY DATA: Lab Results  Component Value Date   WBC 7.0 07/03/2013   HGB 13.3 07/03/2013   HCT 41.5 07/03/2013   MCV 93.3 07/03/2013   PLT 179 07/03/2013      Chemistry      Component Value Date/Time   NA 141 07/03/2013 1040   NA 140 01/13/2012 1038   K 4.2 07/03/2013 1040   K 3.7 01/13/2012 1038   CL 103 01/18/2013 0918   CL 99 01/13/2012 1038   CO2 27 07/03/2013 1040   CO2 30 01/13/2012 1038   BUN 23.3 07/03/2013 1040   BUN 20 01/13/2012 1038   CREATININE 1.3* 07/03/2013 1040   CREATININE 1.16* 01/13/2012 1038      Component Value Date/Time   CALCIUM 10.7* 07/03/2013 1040   CALCIUM 10.3 01/13/2012 1038   ALKPHOS 79 07/03/2013 1040   ALKPHOS 66 01/13/2012 1038   AST 23 07/03/2013 1040   AST 22 01/13/2012 1038   ALT 19 07/03/2013 1040   ALT 24 01/13/2012 1038   BILITOT 0.50 07/03/2013 1040   BILITOT 0.4 01/13/2012 1038       RADIOGRAPHIC STUDIES:  Mm Digital Diagnostic Bilat  12/23/2011  *RADIOLOGY REPORT*  Clinical Data:  Left lumpectomy for breast cancer in 2012.  Initial post lumpectomy exam.  DIGITAL DIAGNOSTIC BILATERAL MAMMOGRAM Mammographic images were processed with CAD.  Comparison:  Prior exams  Findings:  Left lumpectomy changes are noted. The breast parenchyma demonstrates scattered fibroglandular densities.  No suspicious mass, calcification, or architectural distortion is seen.  IMPRESSION: No evidence for malignancy.  Expected left lumpectomy changes. Diagnostic mammography is recommended in 1 year. Findings and recommendations discussed with the patient and provided in written form at the time of the exam.  BI-RADS CATEGORY 2:  Benign finding(s).  Original Report Authenticated By: Harrel Lemon, M.D.    ASSESSMENT: 79 year old female with  #1 stage I  invasive ductal carcinoma with associated DCIS. Originally diagnosed in July 2012. Patient is status post lumpectomy followed by radiation therapy which he completed on  05/21/2011. She was then begun on Arimidex 1 mg daily beginning November 2012. Overall she is doing well and she is without any evidence of disease or any other complaints.  #2 bone density scan done July 2014 reveals osteoporosis. We discussed the results today. Recommendation is to start Fosamax 70 mg q. weekly. Risks benefits and side effects of Fosamax discussed with the patient. She was given literature on it as well. I instructed her on exactly how to take it as well.     PLAN: . #1 continue Arimidex 1 mg daily. Overall she's tolerating it well.  #2 begin Fosamax 70 mg q. weekly.  Prescription was sent to her pharmacy.  #3 patient will be seen back in 6 months time or sooner if need arises.  All questions were answered. The patient knows to call the clinic with any problems, questions or concerns. We can certainly see the patient much sooner if necessary.  I spent 25 minutes counseling the patient face to face. The total time spent in the appointment was 30 minutes.   Drue Second, MD Medical/Oncology [facility] [phone] (beeper) [phone] (Office)  07/03/2013, 11:39 AM

## 2013-07-03 NOTE — Telephone Encounter (Signed)
, °

## 2013-07-03 NOTE — Patient Instructions (Signed)
Alendronate weekly tablets What is this medicine? ALENDRONATE (a LEN droe nate) slows calcium loss from bones. It helps to make healthy bone and to slow bone loss in people with osteoporosis. It may be used to treat Paget's disease. This medicine may be used for other purposes; ask your health care provider or pharmacist if you have questions. COMMON BRAND NAME(S): Fosamax What should I tell my health care provider before I take this medicine? They need to know if you have any of these conditions: -esophagus, stomach, or intestine problems, like acid-reflux or GERD -dental disease -kidney disease -low blood calcium -low vitamin D -problems swallowing -problems sitting or standing for 30 minutes -an unusual or allergic reaction to alendronate, other medicines, foods, dyes, or preservatives -pregnant or trying to get pregnant -breast-feeding How should I use this medicine? You must take this medicine exactly as directed or you will lower the amount of medicine you absorb into your body or you may cause yourself harm. Take your dose by mouth first thing in the morning, after you are up for the day. Do not eat or drink anything before you take this medicine. Swallow your medicine with a full glass (6 to 8 fluid ounces) of plain water. Do not take this tablet with any other drink. Do not chew or crush the tablet. After taking this medicine, do not eat breakfast, drink, or take any medicines or vitamins for at least 30 minutes. Stand or sit up for at least 30 minutes after you take this medicine; do not lie down. Take this medicine on the same day every week. Do not take your medicine more often than directed. Talk to your pediatrician regarding the use of this medicine in children. Special care may be needed. Overdosage: If you think you have taken too much of this medicine contact a poison control center or emergency room at once. NOTE: This medicine is only for you. Do not share this medicine with  others. What if I miss a dose? If you miss a dose, take the dose on the morning after you remember. Then take your next dose on your regular day of the week. Never take 2 tablets on the same day. Do not take double or extra doses. What may interact with this medicine? -aluminum hydroxide -antacids -aspirin -calcium supplements -drugs for inflammation like ibuprofen, naproxen, and others -iron supplements -magnesium supplements -vitamins with minerals This list may not describe all possible interactions. Give your health care provider a list of all the medicines, herbs, non-prescription drugs, or dietary supplements you use. Also tell them if you smoke, drink alcohol, or use illegal drugs. Some items may interact with your medicine. What should I watch for while using this medicine? Visit your doctor or health care professional for regular checks ups. It may be some time before you see benefit from this medicine. Do not stop taking your medication except on your doctor's advice. Your doctor or health care professional may order blood tests and other tests to see how you are doing. You should make sure you get enough calcium and vitamin D while you are taking this medicine, unless your doctor tells you not to. Discuss the foods you eat and the vitamins you take with your health care professional. Some people who take this medicine have severe bone, joint, and/or muscle pain. This medicine may also increase your risk for a broken thigh bone. Tell your doctor right away if you have pain in your upper leg or groin. Tell your   doctor if you have any pain that does not go away or that gets worse. This medicine can make you more sensitive to the sun. If you get a rash while taking this medicine, sunlight may cause the rash to get worse. Keep out of the sun. If you cannot avoid being in the sun, wear protective clothing and use sunscreen. Do not use sun lamps or tanning beds/booths. What side effects may I  notice from receiving this medicine? Side effects that you should report to your doctor or health care professional as soon as possible: -allergic reactions like skin rash, itching or hives, swelling of the face, lips, or tongue -black or tarry stools -bone, muscle or joint pain -changes in vision -chest pain -heartburn or stomach pain -jaw pain, especially after dental work -pain or trouble when swallowing -redness, blistering, peeling or loosening of the skin, including inside the mouth Side effects that usually do not require medical attention (report to your doctor or health care professional if they continue or are bothersome): -changes in taste -diarrhea or constipation -eye pain or itching -headache -nausea or vomiting -stomach gas or fullness This list may not describe all possible side effects. Call your doctor for medical advice about side effects. You may report side effects to FDA at 1-800-FDA-1088. Where should I keep my medicine? Keep out of the reach of children. Store at room temperature of 15 and 30 degrees C (59 and 86 degrees F). Throw away any unused medicine after the expiration date. NOTE: This sheet is a summary. It may not cover all possible information. If you have questions about this medicine, talk to your doctor, pharmacist, or health care provider.  2014, Elsevier/Gold Standard. (2011-06-02 09:02:42)  

## 2013-08-31 ENCOUNTER — Encounter (HOSPITAL_COMMUNITY): Payer: Self-pay | Admitting: Emergency Medicine

## 2013-08-31 ENCOUNTER — Inpatient Hospital Stay (HOSPITAL_COMMUNITY)
Admission: EM | Admit: 2013-08-31 | Discharge: 2013-09-04 | DRG: 481 | Disposition: A | Discharge status: Skilled Nursing Facility | Payer: Medicare Other | Attending: Internal Medicine | Admitting: Internal Medicine

## 2013-08-31 ENCOUNTER — Emergency Department (HOSPITAL_COMMUNITY): Payer: Medicare Other

## 2013-08-31 DIAGNOSIS — D62 Acute posthemorrhagic anemia: Secondary | ICD-10-CM | POA: Diagnosis not present

## 2013-08-31 DIAGNOSIS — I495 Sick sinus syndrome: Secondary | ICD-10-CM | POA: Diagnosis present

## 2013-08-31 DIAGNOSIS — K219 Gastro-esophageal reflux disease without esophagitis: Secondary | ICD-10-CM | POA: Diagnosis present

## 2013-08-31 DIAGNOSIS — M25559 Pain in unspecified hip: Secondary | ICD-10-CM | POA: Diagnosis present

## 2013-08-31 DIAGNOSIS — Z79899 Other long term (current) drug therapy: Secondary | ICD-10-CM | POA: Diagnosis not present

## 2013-08-31 DIAGNOSIS — Z7902 Long term (current) use of antithrombotics/antiplatelets: Secondary | ICD-10-CM | POA: Diagnosis not present

## 2013-08-31 DIAGNOSIS — I4510 Unspecified right bundle-branch block: Secondary | ICD-10-CM | POA: Diagnosis present

## 2013-08-31 DIAGNOSIS — I6529 Occlusion and stenosis of unspecified carotid artery: Secondary | ICD-10-CM | POA: Diagnosis present

## 2013-08-31 DIAGNOSIS — Z87891 Personal history of nicotine dependence: Secondary | ICD-10-CM

## 2013-08-31 DIAGNOSIS — I10 Essential (primary) hypertension: Secondary | ICD-10-CM | POA: Diagnosis present

## 2013-08-31 DIAGNOSIS — S72143A Displaced intertrochanteric fracture of unspecified femur, initial encounter for closed fracture: Secondary | ICD-10-CM | POA: Diagnosis present

## 2013-08-31 DIAGNOSIS — I658 Occlusion and stenosis of other precerebral arteries: Secondary | ICD-10-CM | POA: Diagnosis present

## 2013-08-31 DIAGNOSIS — Z7982 Long term (current) use of aspirin: Secondary | ICD-10-CM | POA: Diagnosis not present

## 2013-08-31 DIAGNOSIS — E039 Hypothyroidism, unspecified: Secondary | ICD-10-CM | POA: Diagnosis present

## 2013-08-31 DIAGNOSIS — R001 Bradycardia, unspecified: Secondary | ICD-10-CM | POA: Diagnosis present

## 2013-08-31 DIAGNOSIS — Y9229 Other specified public building as the place of occurrence of the external cause: Secondary | ICD-10-CM | POA: Diagnosis not present

## 2013-08-31 DIAGNOSIS — F341 Dysthymic disorder: Secondary | ICD-10-CM | POA: Diagnosis present

## 2013-08-31 DIAGNOSIS — Z7983 Long term (current) use of bisphosphonates: Secondary | ICD-10-CM

## 2013-08-31 DIAGNOSIS — Z01810 Encounter for preprocedural cardiovascular examination: Secondary | ICD-10-CM

## 2013-08-31 DIAGNOSIS — W010XXA Fall on same level from slipping, tripping and stumbling without subsequent striking against object, initial encounter: Secondary | ICD-10-CM | POA: Diagnosis present

## 2013-08-31 DIAGNOSIS — I2510 Atherosclerotic heart disease of native coronary artery without angina pectoris: Secondary | ICD-10-CM | POA: Diagnosis present

## 2013-08-31 DIAGNOSIS — Z853 Personal history of malignant neoplasm of breast: Secondary | ICD-10-CM | POA: Diagnosis not present

## 2013-08-31 DIAGNOSIS — Z79811 Long term (current) use of aromatase inhibitors: Secondary | ICD-10-CM

## 2013-08-31 DIAGNOSIS — M810 Age-related osteoporosis without current pathological fracture: Secondary | ICD-10-CM | POA: Diagnosis present

## 2013-08-31 DIAGNOSIS — W1782XA Fall from (out of) grocery cart, initial encounter: Secondary | ICD-10-CM

## 2013-08-31 DIAGNOSIS — I252 Old myocardial infarction: Secondary | ICD-10-CM | POA: Diagnosis not present

## 2013-08-31 DIAGNOSIS — C50919 Malignant neoplasm of unspecified site of unspecified female breast: Secondary | ICD-10-CM

## 2013-08-31 DIAGNOSIS — E785 Hyperlipidemia, unspecified: Secondary | ICD-10-CM | POA: Diagnosis present

## 2013-08-31 DIAGNOSIS — S72141A Displaced intertrochanteric fracture of right femur, initial encounter for closed fracture: Secondary | ICD-10-CM

## 2013-08-31 HISTORY — DX: Fall from (out of) grocery cart, initial encounter: W17.82XA

## 2013-08-31 HISTORY — PX: FRACTURE SURGERY: SHX138

## 2013-08-31 LAB — BASIC METABOLIC PANEL
BUN: 19 mg/dL (ref 6–23)
CALCIUM: 9.1 mg/dL (ref 8.4–10.5)
CO2: 22 mEq/L (ref 19–32)
Chloride: 104 mEq/L (ref 96–112)
Creatinine, Ser: 1.01 mg/dL (ref 0.50–1.10)
GFR calc Af Amer: 60 mL/min — ABNORMAL LOW (ref >90)
GFR, EST NON AFRICAN AMERICAN: 52 mL/min — AB (ref >90)
GLUCOSE: 131 mg/dL — AB (ref 70–99)
Potassium: 4.2 mEq/L (ref 3.7–5.3)
Sodium: 140 mEq/L (ref 137–147)

## 2013-08-31 LAB — CBC WITH DIFFERENTIAL/PLATELET
BASOS PCT: 0 % (ref 0–1)
Basophils Absolute: 0 10*3/uL (ref 0.0–0.1)
EOS ABS: 0 10*3/uL (ref 0.0–0.7)
Eosinophils Relative: 0 % (ref 0–5)
HCT: 39.2 % (ref 36.0–46.0)
Hemoglobin: 13.2 g/dL (ref 12.0–15.0)
Lymphocytes Relative: 8 % — ABNORMAL LOW (ref 12–46)
Lymphs Abs: 1 10*3/uL (ref 0.7–4.0)
MCH: 31.4 pg (ref 26.0–34.0)
MCHC: 33.7 g/dL (ref 30.0–36.0)
MCV: 93.3 fL (ref 78.0–100.0)
MONOS PCT: 6 % (ref 3–12)
Monocytes Absolute: 0.6 10*3/uL (ref 0.1–1.0)
Neutro Abs: 10.1 10*3/uL — ABNORMAL HIGH (ref 1.7–7.7)
Neutrophils Relative %: 86 % — ABNORMAL HIGH (ref 43–77)
PLATELETS: 165 10*3/uL (ref 150–400)
RBC: 4.2 MIL/uL (ref 3.87–5.11)
RDW: 14.1 % (ref 11.5–15.5)
WBC: 11.7 10*3/uL — ABNORMAL HIGH (ref 4.0–10.5)

## 2013-08-31 LAB — ABO/RH: ABO/RH(D): A POS

## 2013-08-31 LAB — PROTIME-INR
INR: 1.1 (ref 0.00–1.49)
PROTHROMBIN TIME: 14 s (ref 11.6–15.2)

## 2013-08-31 LAB — TYPE AND SCREEN
ABO/RH(D): A POS
Antibody Screen: NEGATIVE

## 2013-08-31 MED ORDER — METHOCARBAMOL 500 MG PO TABS
500.0000 mg | ORAL_TABLET | Freq: Four times a day (QID) | ORAL | Status: DC | PRN
  Administered 2013-08-31 – 2013-09-01 (×2): 500 mg via ORAL
  Filled 2013-08-31 (×2): qty 1

## 2013-08-31 MED ORDER — AMLODIPINE BESYLATE 10 MG PO TABS
10.0000 mg | ORAL_TABLET | Freq: Every day | ORAL | Status: DC
  Administered 2013-09-01 – 2013-09-04 (×4): 10 mg via ORAL
  Filled 2013-08-31 (×5): qty 1

## 2013-08-31 MED ORDER — ANASTROZOLE 1 MG PO TABS
1.0000 mg | ORAL_TABLET | Freq: Every day | ORAL | Status: DC
Start: 2013-08-31 — End: 2013-09-04
  Administered 2013-09-01 – 2013-09-04 (×4): 1 mg via ORAL
  Filled 2013-08-31 (×5): qty 1

## 2013-08-31 MED ORDER — MORPHINE SULFATE 4 MG/ML IJ SOLN
4.0000 mg | Freq: Once | INTRAMUSCULAR | Status: AC
  Administered 2013-08-31: 4 mg via INTRAVENOUS
  Filled 2013-08-31: qty 1

## 2013-08-31 MED ORDER — PROMETHAZINE HCL 25 MG/ML IJ SOLN
12.5000 mg | Freq: Once | INTRAMUSCULAR | Status: AC
  Administered 2013-08-31: 12.5 mg via INTRAVENOUS
  Filled 2013-08-31: qty 1

## 2013-08-31 MED ORDER — LEVOTHYROXINE SODIUM 88 MCG PO TABS
88.0000 ug | ORAL_TABLET | Freq: Every day | ORAL | Status: DC
  Administered 2013-09-01 – 2013-09-04 (×4): 88 ug via ORAL
  Filled 2013-08-31 (×7): qty 1

## 2013-08-31 MED ORDER — DIAZEPAM 5 MG/ML IJ SOLN
5.0000 mg | Freq: Once | INTRAMUSCULAR | Status: AC
  Administered 2013-08-31: 2.5 mg via INTRAVENOUS
  Filled 2013-08-31: qty 2

## 2013-08-31 MED ORDER — FLUOXETINE HCL 20 MG PO TABS
20.0000 mg | ORAL_TABLET | Freq: Every day | ORAL | Status: DC
  Administered 2013-09-01 – 2013-09-04 (×4): 20 mg via ORAL
  Filled 2013-08-31 (×5): qty 1

## 2013-08-31 MED ORDER — HYDROCODONE-ACETAMINOPHEN 5-325 MG PO TABS: 1.0000 | ORAL_TABLET | Freq: Four times a day (QID) | ORAL | Status: DC | PRN

## 2013-08-31 MED ORDER — ALENDRONATE SODIUM 70 MG PO TABS: 70.0000 mg | ORAL_TABLET | ORAL | Status: DC

## 2013-08-31 MED ORDER — SIMVASTATIN 20 MG PO TABS
20.0000 mg | ORAL_TABLET | Freq: Every day | ORAL | Status: DC
  Filled 2013-08-31 (×2): qty 1

## 2013-08-31 MED ORDER — SENNA 8.6 MG PO TABS
1.0000 | ORAL_TABLET | Freq: Two times a day (BID) | ORAL | Status: DC
  Administered 2013-09-01: 8.6 mg via ORAL
  Filled 2013-08-31 (×3): qty 1

## 2013-08-31 MED ORDER — DOCUSATE SODIUM 100 MG PO CAPS
100.0000 mg | ORAL_CAPSULE | Freq: Two times a day (BID) | ORAL | Status: DC
  Administered 2013-08-31 – 2013-09-04 (×7): 100 mg via ORAL
  Filled 2013-08-31 (×8): qty 1

## 2013-08-31 MED ORDER — BENAZEPRIL HCL 10 MG PO TABS
10.0000 mg | ORAL_TABLET | Freq: Every day | ORAL | Status: DC
  Administered 2013-09-01 – 2013-09-04 (×4): 10 mg via ORAL
  Filled 2013-08-31 (×5): qty 1

## 2013-08-31 MED ORDER — ONDANSETRON HCL 4 MG/2ML IJ SOLN
4.0000 mg | Freq: Once | INTRAMUSCULAR | Status: AC
  Administered 2013-08-31: 4 mg via INTRAVENOUS
  Filled 2013-08-31: qty 2

## 2013-08-31 MED ORDER — FUROSEMIDE 40 MG PO TABS
40.0000 mg | ORAL_TABLET | Freq: Every day | ORAL | Status: DC
  Administered 2013-09-01 – 2013-09-04 (×4): 40 mg via ORAL
  Filled 2013-08-31 (×5): qty 1

## 2013-08-31 MED ORDER — METHOCARBAMOL 100 MG/ML IJ SOLN
500.0000 mg | Freq: Four times a day (QID) | INTRAMUSCULAR | Status: DC | PRN
  Filled 2013-08-31: qty 5

## 2013-08-31 MED ORDER — MORPHINE SULFATE 2 MG/ML IJ SOLN: 0.5000 mg | INTRAMUSCULAR | Status: DC | PRN

## 2013-08-31 MED ORDER — MORPHINE SULFATE 4 MG/ML IJ SOLN: 4.0000 mg | INTRAMUSCULAR | Status: DC | PRN

## 2013-08-31 MED ORDER — ENOXAPARIN SODIUM 40 MG/0.4ML ~~LOC~~ SOLN
40.0000 mg | SUBCUTANEOUS | Status: DC
  Filled 2013-08-31 (×2): qty 0.4

## 2013-08-31 NOTE — ED Notes (Signed)
No obvious deformity noted on exam, no rotation noted

## 2013-08-31 NOTE — Progress Notes (Signed)
PHARMACIST - PHYSICIAN COMMUNICATION  CONCERNING: P&T Medication Policy Regarding Oral Bisphosphonates  RECOMMENDATION: Your order for alendronate (Fosamax), ibandronate (Boniva), or risedronate (Actonel) has been discontinued at this time.  If the patient's post-hospital medical condition warrants safe use of this class of drugs, please resume the pre-hospital regimen upon discharge.  DESCRIPTION:  Alendronate (Fosamax), ibandronate (Boniva), and risedronate (Actonel) can cause severe esophageal erosions in patients who are unable to remain upright at least 30 minutes after taking this medication.   Since brief interruptions in therapy are thought to have minimal impact on bone mineral density, the [place] has established that bisphosphonate orders should be routinely discontinued during hospitalization.   To override this safety policy and permit administration of Boniva, Fosamax, or Actonel in the hospital, prescribers must write "DO NOT HOLD" in the comments section when placing the order for this class of medications.  Sherlon Handing, PharmD, BCPS Clinical pharmacist, pager [phone] 08/31/2013  6:02 PM

## 2013-08-31 NOTE — Consult Note (Addendum)
ORTHOPAEDIC CONSULTATION  REQUESTING PHYSICIAN: Cristal Ford, DO  Chief Complaint: Right hip pain  HPI: Tammie [name] is a 80 y.o. female who complains of  right hip pain after a fall which apparently occurred today. She was unable to ambulate. Pain is rated as severe, located directly over the right hip, worse with movement, better with rest. She has recently been given some Valium, and is currently not really able to interact with me, has slurred speech, and is not able to provide any history.  Past Medical History  Diagnosis Date  . Breast lump     left  . Depression   . Hyperlipidemia   . Hypertension   . GERD (gastroesophageal reflux disease)   . Cancer     breast  . Breast cancer T1aN0 S/P lumpectomy SLN Bx 01/2011 03/25/2011  . Myocardial infarction 1999    mi  . Bilateral carotid artery stenosis     hx  . History of ETOH abuse     member AA 22.5 years  . Thyroid disease     hypo  . Lumbar herniated disc    Past Surgical History  Procedure Laterality Date  . Breast lumpectomy  2012  . Breast biopsy  200, 88  . Breast biopsy  1985/2000    2 BENIGN LEFT BREAST BXS   History   Social History  . Marital Status: Single    Spouse Name: N/A    Number of Children: N/A  . Years of Education: N/A   Occupational History  . RETIRED [employer]   Social History Main Topics  . Smoking status: Former Smoker    Types: Cigarettes    Quit date: 09/29/2004  . Smokeless tobacco: Never Used     Comment: 2007- quit  . Alcohol Use: No  . Drug Use: No  . Sexual Activity: Not Currently    Birth Control/ Protection: Post-menopausal     Comment: NO HRT/NO B.C. PILLS   Other Topics Concern  . None   Social History Narrative  . None   Family History  Problem Relation Age of Onset  . Breast cancer Paternal Aunt   . Heart attack Mother   . Hypertension Mother   . Hyperlipidemia Mother   . Heart attack Father   . Hypertension Father   . Hyperlipidemia Father    No  Known Allergies Prior to Admission medications   Medication Sig Start Date End Date Taking? Authorizing Provider  alendronate (FOSAMAX) 70 MG tablet Take 1 tablet (70 mg total) by mouth once a week. Take with a full glass of water on an empty stomach. 07/03/13  Yes Deatra Robinson, MD  amLODipine (NORVASC) 10 MG tablet Take 10 mg by mouth daily.     Yes Historical Provider, MD  anastrozole (ARIMIDEX) 1 MG tablet Take 1 tablet (1 mg total) by mouth daily. 07/03/13  Yes Deatra Robinson, MD  aspirin EC 81 MG tablet Take 81 mg by mouth daily.   Yes Historical Provider, MD  benazepril (LOTENSIN) 20 MG tablet Take 10 mg by mouth daily.    Yes Historical Provider, MD  Calcium Carbonate (CALTRATE 600 PO) Take 1 tablet by mouth daily.    Yes Historical Provider, MD  Cholecalciferol (VITAMIN D-3) 1000 UNITS CAPS Take 1 capsule by mouth daily.    Yes Historical Provider, MD  clopidogrel (PLAVIX) 75 MG tablet Take 75 mg by mouth at bedtime.    Yes Historical Provider, MD  FLUoxetine (PROZAC) 20 MG tablet Take  20 mg by mouth daily.     Yes Historical Provider, MD  furosemide (LASIX) 40 MG tablet Take 40 mg by mouth daily.    Yes Historical Provider, MD  levothyroxine (SYNTHROID, LEVOTHROID) 88 MCG tablet Take 88 mcg by mouth daily.   Yes Historical Provider, MD  Multiple Vitamins-Minerals (CENTRUM SILVER PO) Take by mouth daily.   Yes Historical Provider, MD  simvastatin (ZOCOR) 20 MG tablet Take 20 mg by mouth at bedtime.    Yes Historical Provider, MD   Dg Hip Complete Right  08/31/2013   CLINICAL DATA:  Right hip pain status post fall  EXAM: RIGHT HIP - COMPLETE 2+ VIEW  COMPARISON:  None.  FINDINGS: There is a nondisplaced right intertrochanteric fracture with a fracture cleft extending into the proximal femoral diaphysis. There are mild osteoarthritic changes of the right hip. There is no dislocation.  The left hip is unremarkable.  There is degenerative disc disease of the lower lumbar spine.  The SI joints  are unremarkable.  There is a coarsely calcified left pelvic mass likely representing a calcified uterine fibroid.  IMPRESSION: Nondisplaced right intertrochanteric fracture with fracture cleft extending into the proximal femoral diaphysis. No dislocation.  Mild osteoarthritis of the right hip.   Electronically Signed   By: Kathreen Devoid   On: 08/31/2013 13:42   Dg Chest Portable 1 View  08/31/2013   CLINICAL DATA:  Preoperative evaluation for hip surgery, history of hypertension, breast cancer, former smoker  EXAM: PORTABLE CHEST - 1 VIEW  COMPARISON:  Portable exam 1432 hr compared to 02/17/2011  FINDINGS: Normal heart size, mediastinal contours and pulmonary vascularity for technique.  Atherosclerotic calcification aorta.  Mild emphysematous changes.  No acute infiltrate, pleural effusion or pneumothorax.  Bones unremarkable.  IMPRESSION: Mild emphysematous changes.  No acute abnormalities.   Electronically Signed   By: Lavonia Dana M.D.   On: 08/31/2013 14:56    Positive ROS: All other systems have been reviewed and were otherwise negative with the exception of those mentioned in the HPI and as above. My ability to achieve a review of systems is limited by the patient's mental status.  Physical Exam: General: The patient is mildly lethargic, but not in any acute distress.  Cardiovascular: No pedal edema, she is significantly bradycardic, and the 30s and 40s. Respiratory: No cyanosis, no use of accessory musculature GI: No organomegaly, abdomen is soft and non-tender Skin: No lesions in the area of chief complaint, with the exception of some slight bruising. Neurologic: Sensation intact distally Psychiatric: Patient is not competent for consent, although I have spoken with her family over the telephone Lymphatic: No axillary or cervical lymphadenopathy  MUSCULOSKELETAL: Right hip has positive log roll, unable to lift the leg off of the table, EHL and FHL are firing.  Assessment: Minimally  displaced right intertrochanteric hip fracture with multiple coexisting comorbidities including carotid stenosis, bradycardia, history of myocardial infarction, among others listed above.  Plan: This is an acute severe injury which carries risks both from a functional standpoint as well as mortality standpoint. I recommended surgical toward tension in order to restore her capacity to ambulate, however she is going to need preoperative cardiology evaluation first. We will plan to keep her n.p.o. after midnight tonight, and I will plan for right hip intramedullary nail fixation tomorrow. I will discuss the risks benefits and alternatives with the patient once she is cognitively improved, as well as with her family. I have given the family my cellphone number, as  she has 2 physicians that are in the family, and we'll expect to hear from him later tonight.   I have spoken with family and friends, including Vickey Huger [phone] and Dr. Hoyle Sauer H'Doubler [phone]. Apparently Mrs. Valero does not have a power of attorney, no living relatives, and has a friend support system within the Alcoholics Anonymous group in town. She will likely need 2 physician consent for the operation if her mental status does not improve over the course of the next 12-24 hours.   [name], MD Cell [phone]   08/31/2013 4:56 PM   .

## 2013-08-31 NOTE — ED Provider Notes (Signed)
CSN: [id number]     Arrival date & time 08/31/13  1210 History   First MD Initiated Contact with Patient 08/31/13 1212     Chief Complaint  Patient presents with  . Fall   (Consider location/radiation/quality/duration/timing/severity/associated sxs/prior Treatment) HPI Comments: Patient with history of osteoporosis, MI, asymptomatic carotid artery stenosis, on plavix -- presents with complaint of right hip pain after a fall. Patient was in the grocery store when she tripped over the wheel of her cart. Patient was assisted by bystanders. EMS was called. She has been unable to bear any weight. Patient complains of severe right hip pain with any movement. She denies hitting her head or losing consciousness. No neck pain. No chest pain, palpitations, shortness of breath preceding the fall. History of right lower extremity fracture when she was young. Onset of symptoms acute. Course is constant.  Patient is a 80 y.o. female presenting with fall. The history is provided by the patient and medical records.  Fall Associated symptoms include arthralgias and myalgias. Pertinent negatives include no abdominal pain, chest pain, coughing, fever, headaches, nausea, rash, sore throat or vomiting.    Past Medical History  Diagnosis Date  . Breast lump     left  . Depression   . Hyperlipidemia   . Hypertension   . GERD (gastroesophageal reflux disease)   . Cancer     breast  . Breast cancer T1aN0 S/P lumpectomy SLN Bx 01/2011 03/25/2011  . Myocardial infarction 1999    mi  . Bilateral carotid artery stenosis     hx  . History of ETOH abuse     member AA 22.5 years  . Thyroid disease     hypo  . Lumbar herniated disc    Past Surgical History  Procedure Laterality Date  . Breast lumpectomy  2012  . Breast biopsy  200, 88  . Breast biopsy  1985/2000    2 BENIGN LEFT BREAST BXS   Family History  Problem Relation Age of Onset  . Breast cancer Paternal Aunt   . Heart attack Mother   .  Hypertension Mother   . Hyperlipidemia Mother   . Heart attack Father   . Hypertension Father   . Hyperlipidemia Father    History  Substance Use Topics  . Smoking status: Former Smoker    Types: Cigarettes    Quit date: 09/29/2004  . Smokeless tobacco: Never Used     Comment: 2007- quit  . Alcohol Use: No   OB History   Grav Para Term Preterm Abortions TAB SAB Ect Mult Living   0              Obstetric Comments   NO CHILDREN     Review of Systems  Constitutional: Negative for fever.  HENT: Negative for rhinorrhea and sore throat.   Eyes: Negative for redness.  Respiratory: Negative for cough.   Cardiovascular: Negative for chest pain.  Gastrointestinal: Negative for nausea, vomiting, abdominal pain and diarrhea.  Genitourinary: Negative for dysuria.  Musculoskeletal: Positive for arthralgias, gait problem and myalgias.  Skin: Negative for rash.  Neurological: Negative for headaches.    Allergies  Review of patient's allergies indicates no known allergies.  Home Medications   Current Outpatient Rx  Name  Route  Sig  Dispense  Refill  . alendronate (FOSAMAX) 70 MG tablet   Oral   Take 1 tablet (70 mg total) by mouth once a week. Take with a full glass of water on an empty stomach.  12 tablet   6   . amLODipine (NORVASC) 10 MG tablet   Oral   Take 10 mg by mouth daily.           [name] anastrozole (ARIMIDEX) 1 MG tablet   Oral   Take 1 tablet (1 mg total) by mouth daily.   90 tablet   12   . aspirin 81 MG tablet   Oral   Take 81 mg by mouth daily.         . benazepril (LOTENSIN) 20 MG tablet   Oral   Take 40 mg by mouth daily. Per patient taking 1/2 tablet per day.         . Calcium Carbonate (CALTRATE 600 PO)   Oral   Take by mouth 2 (two) times daily.          . Cholecalciferol (VITAMIN D-3) 1000 UNITS CAPS   Oral   Take by mouth.         . clopidogrel (PLAVIX) 75 MG tablet   Oral   Take 75 mg by mouth daily.           [name]  FLUoxetine (PROZAC) 20 MG tablet   Oral   Take 20 mg by mouth daily.           . furosemide (LASIX) 40 MG tablet   Oral   Take 40 mg by mouth daily.          [name] levothyroxine (SYNTHROID, LEVOTHROID) 88 MCG tablet   Oral   Take 88 mcg by mouth daily.         . Multiple Vitamins-Minerals (CENTRUM SILVER PO)   Oral   Take by mouth daily.         . simvastatin (ZOCOR) 20 MG tablet   Oral   Take 20 mg by mouth daily.          Pulse 56  Temp(Src) 98.2 F (36.8 C) (Oral)  Resp 17  Ht 5\' 2"  (1.575 m)  Wt 120 lb (54.432 kg)  BMI 21.94 kg/m2  SpO2 98% Physical Exam  Nursing note and vitals reviewed. Constitutional: She appears well-developed and well-nourished.  HENT:  Head: Normocephalic and atraumatic.  Eyes: Conjunctivae are normal. Right eye exhibits no discharge. Left eye exhibits no discharge.  Neck: Normal range of motion. Neck supple.  Cardiovascular: Normal rate, regular rhythm and normal heart sounds.   Pulses:      Dorsalis pedis pulses are 2+ on the right side, and 2+ on the left side.       Posterior tibial pulses are 2+ on the right side, and 2+ on the left side.  Pulmonary/Chest: Effort normal and breath sounds normal.  Abdominal: Soft. There is no tenderness.  Musculoskeletal:       Right hip: She exhibits decreased range of motion, tenderness and bony tenderness. Decreased strength: unable to test hip.       Right knee: Normal.       Right ankle: Normal.       Cervical back: Normal.       Lumbar back: Normal.       Legs: Neurological: She is alert.  Skin: Skin is warm and dry.  Psychiatric: She has a normal mood and affect.    ED Course  Procedures (including critical care time) Labs Review Labs Reviewed - No data to display Imaging Review Dg Hip Complete Right  08/31/2013   CLINICAL DATA:  Right hip pain status post fall  EXAM: RIGHT  HIP - COMPLETE 2+ VIEW  COMPARISON:  None.  FINDINGS: There is a nondisplaced right intertrochanteric  fracture with a fracture cleft extending into the proximal femoral diaphysis. There are mild osteoarthritic changes of the right hip. There is no dislocation.  The left hip is unremarkable.  There is degenerative disc disease of the lower lumbar spine.  The SI joints are unremarkable.  There is a coarsely calcified left pelvic mass likely representing a calcified uterine fibroid.  IMPRESSION: Nondisplaced right intertrochanteric fracture with fracture cleft extending into the proximal femoral diaphysis. No dislocation.  Mild osteoarthritis of the right hip.   Electronically Signed   By: Kathreen Devoid   On: 08/31/2013 13:42    EKG Interpretation   None      12:26 PM Patient seen and examined. Work-up initiated. Medications ordered.   Vital signs reviewed and are as follows: Filed Vitals:   08/31/13 1218  Pulse: 56  Temp: 98.2 F (36.8 C)  Resp: 17   2:24 PM Hip fracture by x-ray. Protocol ordered placed. Will call orthopedics.   2:28 PM Spoke with Dr. Mardelle Matte who will see.   4:20 PM [facility].    MDM   1. Closed intertrochanteric fracture of right hip    Admit for hip fracture.     Carlisle Cater, PA-C 08/31/13 [phone]

## 2013-08-31 NOTE — H&P (Signed)
[facility] History and Physical  Tammie [name] TDD:[id number] DOB: 03-08-1934 DOA: 08/31/2013  Referring physician:  PCP: Antony Blackbird, MD  Specialists:   Chief Complaint: Fall  HPI: Tammie [name] is a 80 y.o. female  With a history of coronary artery disease, carotid artery stenosis, hypertension, breast cancer, that presents emergency department after falling this afternoon. Patient was at the grocery store, she tripped over her cart. Patient was assisted by bystanders. EMS was called and patient was brought into the emergency department was unable to bear any weight on her right hip. He states she is normally very active and does yoga and exercises daily. Patient also states that she's had a history of her right lower extremity fracture when she was young. At this time patient is experiencing an immense amount of pain in the right hip. She denies any shortness of breath, chest pain, palpitations. She does also state that she sees Dr. Oneida Alar for her carotid artery stenosis for which she takes Plavix and aspirin.  Review of Systems:  Constitutional: Denies fever, chills, diaphoresis, appetite change and fatigue.  HEENT: Denies photophobia, eye pain, redness, hearing loss, ear pain, congestion, sore throat, rhinorrhea, sneezing, mouth sores, trouble swallowing, neck pain, neck stiffness and tinnitus.   Respiratory: Denies SOB, DOE, cough, chest tightness,  and wheezing.   Cardiovascular: Denies chest pain, palpitations and leg swelling.  Gastrointestinal: Denies nausea, vomiting, abdominal pain, diarrhea, constipation, blood in stool and abdominal distention.  Genitourinary: Denies dysuria, urgency, frequency, hematuria, flank pain and difficulty urinating.  Musculoskeletal: Complains of right hip pain. Skin: Denies pallor, rash and wound.  Neurological: Denies dizziness, seizures, syncope, weakness, light-headedness, numbness and headaches.  Hematological: Denies adenopathy. Easy  bruising, personal or family bleeding history  Psychiatric/Behavioral: Denies suicidal ideation, mood changes, confusion, nervousness, sleep disturbance and agitation  Past Medical History  Diagnosis Date  . Breast lump     left  . Depression   . Hyperlipidemia   . Hypertension   . GERD (gastroesophageal reflux disease)   . Cancer     breast  . Breast cancer T1aN0 S/P lumpectomy SLN Bx 01/2011 03/25/2011  . Myocardial infarction 1999    mi  . Bilateral carotid artery stenosis     hx  . History of ETOH abuse     member AA 22.5 years  . Thyroid disease     hypo  . Lumbar herniated disc    Past Surgical History  Procedure Laterality Date  . Breast lumpectomy  2012  . Breast biopsy  200, 88  . Breast biopsy  1985/2000    2 BENIGN LEFT BREAST BXS   Social History:  reports that she quit smoking about 8 years ago. Her smoking use included Cigarettes. She smoked 0.00 packs per day. She has never used smokeless tobacco. She reports that she does not drink alcohol or use illicit drugs. Patient lives at home alone. Is normally able to do her daily activities.  No Known Allergies  Family History  Problem Relation Age of Onset  . Breast cancer Paternal Aunt   . Heart attack Mother   . Hypertension Mother   . Hyperlipidemia Mother   . Heart attack Father   . Hypertension Father   . Hyperlipidemia Father    Prior to Admission medications   Medication Sig Start Date End Date Taking? Authorizing Provider  alendronate (FOSAMAX) 70 MG tablet Take 1 tablet (70 mg total) by mouth once a week. Take with a full glass of water  on an empty stomach. 07/03/13  Yes Deatra Robinson, MD  amLODipine (NORVASC) 10 MG tablet Take 10 mg by mouth daily.     Yes Historical Provider, MD  anastrozole (ARIMIDEX) 1 MG tablet Take 1 tablet (1 mg total) by mouth daily. 07/03/13  Yes Deatra Robinson, MD  aspirin EC 81 MG tablet Take 81 mg by mouth daily.   Yes Historical Provider, MD  benazepril (LOTENSIN) 20 MG  tablet Take 10 mg by mouth daily.    Yes Historical Provider, MD  Calcium Carbonate (CALTRATE 600 PO) Take 1 tablet by mouth daily.    Yes Historical Provider, MD  Cholecalciferol (VITAMIN D-3) 1000 UNITS CAPS Take 1 capsule by mouth daily.    Yes Historical Provider, MD  clopidogrel (PLAVIX) 75 MG tablet Take 75 mg by mouth at bedtime.    Yes Historical Provider, MD  FLUoxetine (PROZAC) 20 MG tablet Take 20 mg by mouth daily.     Yes Historical Provider, MD  furosemide (LASIX) 40 MG tablet Take 40 mg by mouth daily.    Yes Historical Provider, MD  levothyroxine (SYNTHROID, LEVOTHROID) 88 MCG tablet Take 88 mcg by mouth daily.   Yes Historical Provider, MD  Multiple Vitamins-Minerals (CENTRUM SILVER PO) Take by mouth daily.   Yes Historical Provider, MD  simvastatin (ZOCOR) 20 MG tablet Take 20 mg by mouth at bedtime.    Yes Historical Provider, MD   Physical Exam: Filed Vitals:   08/31/13 1613  BP: 151/46  Pulse: 55  Temp:   Resp: 20     General: Well developed, well nourished, NAD, appears stated age  HEENT: NCAT, PERRLA, EOMI, Anicteic Sclera, mucous membranes moist. No pharyngeal erythema or exudates  Neck: Supple, no JVD, no masses  Cardiovascular: S1 S2 auscultated, bradycardic  Respiratory: Clear to auscultation bilaterally with equal chest rise  Abdomen: Soft, nontender, nondistended, + bowel sounds  Extremities: warm dry without cyanosis clubbing or edema.  Tenderness to palpation of the right hip. Right foot, plantar surface has callus.  Neuro: AAOx3, cranial nerves grossly intact. Strength 5/5 in patient's upper and lower extremities bilaterally.   Skin: Without rashes exudates or nodules  Psych: Normal affect and demeanor with intact judgement and insight  Labs on Admission:  Basic Metabolic Panel:  Recent Labs Lab 08/31/13 1421  NA 140  K 4.2  CL 104  CO2 22  GLUCOSE 131*  BUN 19  CREATININE 1.01  CALCIUM 9.1   Liver Function Tests: No results  found for this basename: AST, ALT, ALKPHOS, BILITOT, PROT, ALBUMIN,  in the last 168 hours No results found for this basename: LIPASE, AMYLASE,  in the last 168 hours No results found for this basename: AMMONIA,  in the last 168 hours CBC:  Recent Labs Lab 08/31/13 1421  WBC 11.7*  NEUTROABS 10.1*  HGB 13.2  HCT 39.2  MCV 93.3  PLT 165   Cardiac Enzymes: No results found for this basename: CKTOTAL, CKMB, CKMBINDEX, TROPONINI,  in the last 168 hours  BNP (last 3 results) No results found for this basename: PROBNP,  in the last 8760 hours CBG: No results found for this basename: GLUCAP,  in the last 168 hours  Radiological Exams on Admission: Dg Hip Complete Right  08/31/2013   CLINICAL DATA:  Right hip pain status post fall  EXAM: RIGHT HIP - COMPLETE 2+ VIEW  COMPARISON:  None.  FINDINGS: There is a nondisplaced right intertrochanteric fracture with a fracture cleft extending into the proximal  femoral diaphysis. There are mild osteoarthritic changes of the right hip. There is no dislocation.  The left hip is unremarkable.  There is degenerative disc disease of the lower lumbar spine.  The SI joints are unremarkable.  There is a coarsely calcified left pelvic mass likely representing a calcified uterine fibroid.  IMPRESSION: Nondisplaced right intertrochanteric fracture with fracture cleft extending into the proximal femoral diaphysis. No dislocation.  Mild osteoarthritis of the right hip.   Electronically Signed   By: Kathreen Devoid   On: 08/31/2013 13:42   Dg Chest Portable 1 View  08/31/2013   CLINICAL DATA:  Preoperative evaluation for hip surgery, history of hypertension, breast cancer, former smoker  EXAM: PORTABLE CHEST - 1 VIEW  COMPARISON:  Portable exam 1432 hr compared to 02/17/2011  FINDINGS: Normal heart size, mediastinal contours and pulmonary vascularity for technique.  Atherosclerotic calcification aorta.  Mild emphysematous changes.  No acute infiltrate, pleural effusion or  pneumothorax.  Bones unremarkable.  IMPRESSION: Mild emphysematous changes.  No acute abnormalities.   Electronically Signed   By: Lavonia Dana M.D.   On: 08/31/2013 14:56    EKG: Independently reviewed. Sinus bradycardia, rate 57, right bundle  Assessment/Plan     Intertrochanteric fracture of right hip secondary to mechanical fall Patient be admitted to medical surgical unit. Dr. Mardelle Matte, orthopedist has been contacted with surgery planned for tomorrow. Will provide pain control with morphine as well as hydrocodone and robaxin.  Will also contact cardiology for cardiac clearance as patient has been sinus bradycardia. Patient will be placed on telemetry monitor. Will also consult social work for possible sniff placement, as well as PT and OT to evaluate the patient after surgery. Due to patient's medical history including breast cancer as well as coronary disease and bilateral carotid artery stenosis patient is high risk for surgery. Will hold patient's Plavix as well as aspirin.    Sinus bradycardia Will obtain cardiology consult as well as check patient's electrolytes and TSH level. Patient currently is not on a beta blocker.    Hypothyroidism Will continue Synthroid. Will also check TSH level.    Hypertension Will continue patient's home medications of amlodipine, benazepril, furosemide.    Hyperlipidemia Will continue simvastatin   Carotid artery stenosis At this time will hold patient's Plavix as well as aspirin. Will also have cardiology assess the patient for upcoming surgery.   Depression anxiety Will continue patient's fluoxetine.   Osteoporosis Will continue patient's Fosamax. Will hold her vitamin D3 as well as calcium carbonate.   History of breast cancer Will continue Arimidex.  DVT prophylaxis:  Lovenox   Code Status: full  Condition: guarded  Family Communication: none at bedside. Admission, patients condition and plan of care including tests being ordered have  been discussed with the patient and she indicates understanding and agrees with the plan and Code Status.  Disposition Plan: Admitted   Time spent: 60 minutes  Levina Boyack D.O. [facility] Pager [phone]  If 7PM-7AM, please contact night-coverage [email or web address] Password [facility] 08/31/2013, 4:48 PM

## 2013-08-31 NOTE — ED Notes (Addendum)
C/o right hip pain after falling in the parking lot at grocery store, denies hitting head, denies neck pain, states she tripped over a wheel on a shopping cart she was passing by. Denies loc

## 2013-08-31 NOTE — ED Notes (Signed)
Orthopedic at bedside

## 2013-08-31 NOTE — ED Notes (Addendum)
Josh Geiple, PA at bedside 

## 2013-08-31 NOTE — ED Notes (Signed)
Hospitalist at the bedside 

## 2013-08-31 NOTE — ED Notes (Addendum)
Dr. Ree Kida notified of frequent bradycardia, she is aware and she has ordered telemetry monitoring.  Patient is asymptomatic when bradycardic.

## 2013-08-31 NOTE — ED Provider Notes (Addendum)
Medical screening examination/treatment/procedure(s) were conducted as a shared visit with non-physician practitioner(s) and myself.  I personally evaluated the patient during the encounter.  EKG Interpretation   None       Pt with mechanical fall landing on right posterior hip, plain films reveal an intertroch fracture, non displaced.  Tender on right pelvis without instability, no shortening.  Abd soft, no lumbar tenderness on exam.  Lungs clear.  Will need admission, likely operation on hip.  Will speak to orthopedics, obtain clearing labs, and contact hospitalist for admission.    Otherwise, will keep comfortable with analgesics, antiemetics as needed, and keep NPO for now.    Tammie [name]. Dorna Mai, MD 08/31/13 [place] Alison Kubicki, MD 08/31/13 1447

## 2013-08-31 NOTE — Progress Notes (Signed)
Orthopedic Tech Progress Note Patient Details:  Tammie [name] [birth year] [id number]  Ortho Devices Ortho Device/Splint Location: trapeze bar patient helper Ortho Device/Splint Interventions: Application   Hildred Priest 08/31/2013, 8:08 PM

## 2013-08-31 NOTE — Consult Note (Signed)
SAHITHI ORDOYNE [name]:[id number] DOB: 1934-05-09 DOA: 08/31/2013  Referring physician:  PCP: Antony Blackbird, MD  Reason for consult: Pre-op eval and bradycardia   HPI: Tammie [name] is a 80 y.o. female with a h/o carotid artery stenosis, RBBB, hypertension and breast cancer who present to ER with R hip fracture after a mechanical fall.   Was in [employer] today and had a mechanical fall. Brought to ER and found to have right hip fracture. In ER HR occasionally in 40s with occasional sinus pauses and junctional beats which were asx.  She has received some pain medications and is a bit of a difficult historian. She says she was told in 1999 that she had an MI but doesn't remember if anything was done about it. She cannot remember if she had a heart catheterization. She says she may have had a stress test recently but cannot remember.   Currently she lives alone and reports she is normally very active and does yoga and exercises daily with 20 minute walks without any CP or dyspnea. Denies any syncope or presyncope. She does also state that she sees Dr. Oneida Alar for her carotid artery stenosis for which she takes Plavix and aspirin. Last u/s showed:  Non-Invasive Vascular Imaging  CAROTID DUPLEX 08/30/2012  Right ICA 20 - 39 % stenosis  Left ICA 40 - 59 % stenosis   Review of Systems:  Constitutional: Denies fever, chills, diaphoresis, appetite change and fatigue.  HEENT: Denies photophobia, eye pain, redness, hearing loss, ear pain, congestion, sore throat, rhinorrhea, sneezing, mouth sores, trouble swallowing, neck pain, neck stiffness and tinnitus.   Respiratory: Denies SOB, DOE, cough, chest tightness,  and wheezing.   Cardiovascular: Denies chest pain, palpitations and leg swelling.  Gastrointestinal: Denies nausea, vomiting, abdominal pain, diarrhea, constipation, blood in stool and abdominal distention.  Genitourinary: Denies dysuria, urgency, frequency, hematuria, flank pain and difficulty  urinating.  Musculoskeletal: Complains of right hip pain. Skin: Denies pallor, rash and wound.  Neurological: Denies dizziness, seizures, syncope, weakness, light-headedness, numbness and headaches.  Hematological: Denies adenopathy. Easy bruising, personal or family bleeding history  Psychiatric/Behavioral: Denies suicidal ideation, mood changes, confusion, nervousness, sleep disturbance and agitation  Past Medical History  Diagnosis Date  . Breast lump     left  . Depression   . Hyperlipidemia   . Hypertension   . GERD (gastroesophageal reflux disease)   . Cancer     breast  . Breast cancer T1aN0 S/P lumpectomy SLN Bx 01/2011 03/25/2011  . Myocardial infarction 1999    mi  . Bilateral carotid artery stenosis     hx  . History of ETOH abuse     member AA 22.5 years  . Thyroid disease     hypo  . Lumbar herniated disc    Past Surgical History  Procedure Laterality Date  . Breast lumpectomy  2012  . Breast biopsy  200, 88  . Breast biopsy  1985/2000    2 BENIGN LEFT BREAST BXS   Social History:  reports that she quit smoking about 8 years ago. Her smoking use included Cigarettes. She smoked 0.00 packs per day. She has never used smokeless tobacco. She reports that she does not drink alcohol or use illicit drugs. Patient lives at home alone. Is normally able to do her daily activities.  No Known Allergies  Family History  Problem Relation Age of Onset  . Breast cancer Paternal Aunt   . Heart attack Mother   . Hypertension Mother   .  Hyperlipidemia Mother   . Heart attack Father   . Hypertension Father   . Hyperlipidemia Father    Prior to Admission medications   Medication Sig Start Date End Date Taking? Authorizing Provider  alendronate (FOSAMAX) 70 MG tablet Take 1 tablet (70 mg total) by mouth once a week. Take with a full glass of water on an empty stomach. 07/03/13  Yes Deatra Robinson, MD  amLODipine (NORVASC) 10 MG tablet Take 10 mg by mouth daily.     Yes  Historical Provider, MD  anastrozole (ARIMIDEX) 1 MG tablet Take 1 tablet (1 mg total) by mouth daily. 07/03/13  Yes Deatra Robinson, MD  aspirin EC 81 MG tablet Take 81 mg by mouth daily.   Yes Historical Provider, MD  benazepril (LOTENSIN) 20 MG tablet Take 10 mg by mouth daily.    Yes Historical Provider, MD  Calcium Carbonate (CALTRATE 600 PO) Take 1 tablet by mouth daily.    Yes Historical Provider, MD  Cholecalciferol (VITAMIN D-3) 1000 UNITS CAPS Take 1 capsule by mouth daily.    Yes Historical Provider, MD  clopidogrel (PLAVIX) 75 MG tablet Take 75 mg by mouth at bedtime.    Yes Historical Provider, MD  FLUoxetine (PROZAC) 20 MG tablet Take 20 mg by mouth daily.     Yes Historical Provider, MD  furosemide (LASIX) 40 MG tablet Take 40 mg by mouth daily.    Yes Historical Provider, MD  levothyroxine (SYNTHROID, LEVOTHROID) 88 MCG tablet Take 88 mcg by mouth daily.   Yes Historical Provider, MD  Multiple Vitamins-Minerals (CENTRUM SILVER PO) Take by mouth daily.   Yes Historical Provider, MD  simvastatin (ZOCOR) 20 MG tablet Take 20 mg by mouth at bedtime.    Yes Historical Provider, MD   Physical Exam: Filed Vitals:   08/31/13 1613  BP: 151/46  Pulse: 55  Temp:   Resp: 20     General: Well developed, well nourished, NAD, appears stated age  HEENT: NCAT, PERRLA, EOMI, Anicteic Sclera, mucous membranes moist. No pharyngeal erythema or exudates  Neck: Supple, supple, no masses. Carotids 2+ bilaterally with bilateral bruits  Cardiovascular: S1 S2 auscultated, bradycardic no murmur  Respiratory: Clear to auscultation bilaterally with equal chest rise  Abdomen: Soft, nontender, nondistended, + bowel sounds  Extremities: warm dry without cyanosis clubbing or edema.  Tenderness to palpation of the right hip. Right foot, plantar surface has callus.  Neuro: AAOx3, cranial nerves grossly intact. Strength 5/5 in patient's upper and L lower extremity. Uanble to move R LUE well due to  pain  Skin: Without rashes exudates or nodules  Psych: Mildly confused. But otherwise non-focal  Labs on Admission:  Basic Metabolic Panel:  Recent Labs Lab 08/31/13 1421  NA 140  K 4.2  CL 104  CO2 22  GLUCOSE 131*  BUN 19  CREATININE 1.01  CALCIUM 9.1   Liver Function Tests: No results found for this basename: AST, ALT, ALKPHOS, BILITOT, PROT, ALBUMIN,  in the last 168 hours No results found for this basename: LIPASE, AMYLASE,  in the last 168 hours No results found for this basename: AMMONIA,  in the last 168 hours CBC:  Recent Labs Lab 08/31/13 1421  WBC 11.7*  NEUTROABS 10.1*  HGB 13.2  HCT 39.2  MCV 93.3  PLT 165   Cardiac Enzymes: No results found for this basename: CKTOTAL, CKMB, CKMBINDEX, TROPONINI,  in the last 168 hours  BNP (last 3 results) No results found for this basename: PROBNP,  in the last 8760 hours CBG: No results found for this basename: GLUCAP,  in the last 168 hours  Radiological Exams on Admission: Dg Hip Complete Right  08/31/2013   CLINICAL DATA:  Right hip pain status post fall  EXAM: RIGHT HIP - COMPLETE 2+ VIEW  COMPARISON:  None.  FINDINGS: There is a nondisplaced right intertrochanteric fracture with a fracture cleft extending into the proximal femoral diaphysis. There are mild osteoarthritic changes of the right hip. There is no dislocation.  The left hip is unremarkable.  There is degenerative disc disease of the lower lumbar spine.  The SI joints are unremarkable.  There is a coarsely calcified left pelvic mass likely representing a calcified uterine fibroid.  IMPRESSION: Nondisplaced right intertrochanteric fracture with fracture cleft extending into the proximal femoral diaphysis. No dislocation.  Mild osteoarthritis of the right hip.   Electronically Signed   By: Kathreen Devoid   On: 08/31/2013 13:42   Dg Chest Portable 1 View  08/31/2013   CLINICAL DATA:  Preoperative evaluation for hip surgery, history of hypertension, breast  cancer, former smoker  EXAM: PORTABLE CHEST - 1 VIEW  COMPARISON:  Portable exam 1432 hr compared to 02/17/2011  FINDINGS: Normal heart size, mediastinal contours and pulmonary vascularity for technique.  Atherosclerotic calcification aorta.  Mild emphysematous changes.  No acute infiltrate, pleural effusion or pneumothorax.  Bones unremarkable.  IMPRESSION: Mild emphysematous changes.  No acute abnormalities.   Electronically Signed   By: Lavonia Dana M.D.   On: 08/31/2013 14:56    EKG: Independently reviewed. Sinus bradycardia, rate 57, RBBB. Diffuse Non-specific  ST-T wave abnormalities. (no change from 02/17/2011  TELE strips: SB with rare dropped p waves and junctional beats PR 189 ms   Assessment/Plan  1. R hip fracture 2. Sinus bradycardia with occasional sinus pauses 3. Reported h/o MI - no documented CAD 4. RBBB, chronic 5. Non-obstructive carotid artery stenosis   She reports a h/o CAD from what sounds like an old ECG in [place]. However it appears that this has never been proven or otherwise documented. Given her excellent functional capacity, her overall peri-operative CV risk seems low. We will get an echo and if EF preserved, I think she can go to surgery without an ischemic w/u. She has been on Plavix for her carotid stenosis and this can be held for surgery.   She does have bradycardia and some sinus node dysfunction so would avoid AV nodal blockers. TFTs have been ordered by priamry team. Would keep on tele in peri-op period but I do not suspect this will be major issue while in house.   Daniel Bensimhon,MD 6:08 PM

## 2013-09-01 ENCOUNTER — Encounter (HOSPITAL_COMMUNITY): Payer: Medicare Other | Admitting: Anesthesiology

## 2013-09-01 ENCOUNTER — Inpatient Hospital Stay (HOSPITAL_COMMUNITY): Payer: Medicare Other

## 2013-09-01 ENCOUNTER — Encounter (HOSPITAL_COMMUNITY)
Admission: EM | Disposition: A | Discharge status: Skilled Nursing Facility | Payer: Self-pay | Source: Home / Self Care | Attending: Internal Medicine

## 2013-09-01 ENCOUNTER — Inpatient Hospital Stay (HOSPITAL_COMMUNITY): Payer: Medicare Other | Admitting: Anesthesiology

## 2013-09-01 DIAGNOSIS — I10 Essential (primary) hypertension: Secondary | ICD-10-CM

## 2013-09-01 DIAGNOSIS — I6529 Occlusion and stenosis of unspecified carotid artery: Secondary | ICD-10-CM

## 2013-09-01 DIAGNOSIS — E785 Hyperlipidemia, unspecified: Secondary | ICD-10-CM

## 2013-09-01 DIAGNOSIS — I517 Cardiomegaly: Secondary | ICD-10-CM

## 2013-09-01 DIAGNOSIS — I498 Other specified cardiac arrhythmias: Secondary | ICD-10-CM

## 2013-09-01 DIAGNOSIS — C50919 Malignant neoplasm of unspecified site of unspecified female breast: Secondary | ICD-10-CM

## 2013-09-01 HISTORY — PX: FEMUR IM NAIL: SHX1597

## 2013-09-01 LAB — BASIC METABOLIC PANEL
BUN: 14 mg/dL (ref 6–23)
CALCIUM: 9.1 mg/dL (ref 8.4–10.5)
CO2: 22 mEq/L (ref 19–32)
Chloride: 108 mEq/L (ref 96–112)
Creatinine, Ser: 1.01 mg/dL (ref 0.50–1.10)
GFR calc Af Amer: 60 mL/min — ABNORMAL LOW (ref >90)
GFR, EST NON AFRICAN AMERICAN: 52 mL/min — AB (ref >90)
GLUCOSE: 117 mg/dL — AB (ref 70–99)
Potassium: 4 mEq/L (ref 3.7–5.3)
Sodium: 144 mEq/L (ref 137–147)

## 2013-09-01 LAB — CBC
HEMATOCRIT: 36.8 % (ref 36.0–46.0)
HEMOGLOBIN: 12.1 g/dL (ref 12.0–15.0)
MCH: 30.9 pg (ref 26.0–34.0)
MCHC: 32.9 g/dL (ref 30.0–36.0)
MCV: 93.9 fL (ref 78.0–100.0)
Platelets: 170 10*3/uL (ref 150–400)
RBC: 3.92 MIL/uL (ref 3.87–5.11)
RDW: 14.4 % (ref 11.5–15.5)
WBC: 11.3 10*3/uL — ABNORMAL HIGH (ref 4.0–10.5)

## 2013-09-01 LAB — URINALYSIS, ROUTINE W REFLEX MICROSCOPIC
Bilirubin Urine: NEGATIVE
Glucose, UA: NEGATIVE mg/dL
HGB URINE DIPSTICK: NEGATIVE
KETONES UR: 15 mg/dL — AB
Leukocytes, UA: NEGATIVE
Nitrite: NEGATIVE
PROTEIN: NEGATIVE mg/dL
Specific Gravity, Urine: 1.016 (ref 1.005–1.030)
Urobilinogen, UA: 1 mg/dL (ref 0.0–1.0)
pH: 7.5 (ref 5.0–8.0)

## 2013-09-01 LAB — TSH: TSH: 0.459 u[IU]/mL (ref 0.350–4.500)

## 2013-09-01 LAB — MRSA PCR SCREENING: MRSA BY PCR: NEGATIVE

## 2013-09-01 SURGERY — INSERTION, INTRAMEDULLARY ROD, FEMUR
Anesthesia: General | Site: Hip | Laterality: Right

## 2013-09-01 MED ORDER — SODIUM CHLORIDE 0.9 % IV SOLN
10.0000 mg | INTRAVENOUS | Status: DC | PRN
  Administered 2013-09-01: 25 ug/min via INTRAVENOUS

## 2013-09-01 MED ORDER — PROPOFOL 10 MG/ML IV BOLUS
INTRAVENOUS | Status: DC | PRN
  Administered 2013-09-01: 150 mg via INTRAVENOUS

## 2013-09-01 MED ORDER — METOCLOPRAMIDE HCL 10 MG PO TABS: 5.0000 mg | ORAL_TABLET | Freq: Three times a day (TID) | ORAL | Status: DC | PRN

## 2013-09-01 MED ORDER — CEFAZOLIN SODIUM-DEXTROSE 2-3 GM-% IV SOLR
2.0000 g | INTRAVENOUS | Status: AC
  Administered 2013-09-01: 2 g via INTRAVENOUS
  Filled 2013-09-01: qty 50

## 2013-09-01 MED ORDER — POLYETHYLENE GLYCOL 3350 17 G PO PACK: 17.0000 g | PACK | Freq: Every day | ORAL | Status: DC | PRN

## 2013-09-01 MED ORDER — FENTANYL CITRATE 0.05 MG/ML IJ SOLN
INTRAMUSCULAR | Status: DC | PRN
  Administered 2013-09-01 (×6): 50 ug via INTRAVENOUS

## 2013-09-01 MED ORDER — STERILE WATER FOR INJECTION IJ SOLN
INTRAMUSCULAR | Status: AC
  Filled 2013-09-01: qty 10

## 2013-09-01 MED ORDER — MENTHOL 3 MG MT LOZG: 1.0000 | LOZENGE | OROMUCOSAL | Status: DC | PRN

## 2013-09-01 MED ORDER — HYDROMORPHONE HCL PF 1 MG/ML IJ SOLN
0.2500 mg | INTRAMUSCULAR | Status: DC | PRN
  Administered 2013-09-01 (×2): 0.25 mg via INTRAVENOUS

## 2013-09-01 MED ORDER — FENTANYL CITRATE 0.05 MG/ML IJ SOLN
INTRAMUSCULAR | Status: AC
  Filled 2013-09-01: qty 5

## 2013-09-01 MED ORDER — HYDROMORPHONE HCL PF 1 MG/ML IJ SOLN
INTRAMUSCULAR | Status: AC
  Filled 2013-09-01: qty 1

## 2013-09-01 MED ORDER — MORPHINE SULFATE 2 MG/ML IJ SOLN: 0.5000 mg | INTRAMUSCULAR | Status: DC | PRN

## 2013-09-01 MED ORDER — DEXAMETHASONE SODIUM PHOSPHATE 4 MG/ML IJ SOLN
INTRAMUSCULAR | Status: AC
  Filled 2013-09-01: qty 1

## 2013-09-01 MED ORDER — MIDAZOLAM HCL 5 MG/5ML IJ SOLN
INTRAMUSCULAR | Status: DC | PRN
  Administered 2013-09-01 (×2): 1 mg via INTRAVENOUS

## 2013-09-01 MED ORDER — ACETAMINOPHEN 650 MG RE SUPP
650.0000 mg | Freq: Four times a day (QID) | RECTAL | Status: DC | PRN
Start: 2013-09-01 — End: 2013-09-04

## 2013-09-01 MED ORDER — EPHEDRINE SULFATE 50 MG/ML IJ SOLN
INTRAMUSCULAR | Status: DC | PRN
  Administered 2013-09-01: 10 mg via INTRAVENOUS

## 2013-09-01 MED ORDER — DEXTROSE 5 % IV SOLN
INTRAVENOUS | Status: DC | PRN
  Administered 2013-09-01: 17:00:00 via INTRAVENOUS

## 2013-09-01 MED ORDER — LACTATED RINGERS IV SOLN
INTRAVENOUS | Status: DC | PRN
  Administered 2013-09-01: 17:00:00 via INTRAVENOUS

## 2013-09-01 MED ORDER — ROCURONIUM BROMIDE 50 MG/5ML IV SOLN
INTRAVENOUS | Status: AC
  Filled 2013-09-01: qty 1

## 2013-09-01 MED ORDER — DEXAMETHASONE SODIUM PHOSPHATE 4 MG/ML IJ SOLN
INTRAMUSCULAR | Status: DC | PRN
  Administered 2013-09-01: 4 mg via INTRAVENOUS

## 2013-09-01 MED ORDER — SENNA-DOCUSATE SODIUM 8.6-50 MG PO TABS: 2.0000 | ORAL_TABLET | Freq: Every day | ORAL | Status: DC

## 2013-09-01 MED ORDER — LACTATED RINGERS IV SOLN
INTRAVENOUS | Status: DC | PRN
  Administered 2013-09-01: 18:00:00 via INTRAVENOUS

## 2013-09-01 MED ORDER — ALUM & MAG HYDROXIDE-SIMETH 200-200-20 MG/5ML PO SUSP: 30.0000 mL | ORAL | Status: DC | PRN

## 2013-09-01 MED ORDER — GLYCOPYRROLATE 0.2 MG/ML IJ SOLN
INTRAMUSCULAR | Status: DC | PRN
  Administered 2013-09-01: 0.2 mg via INTRAVENOUS

## 2013-09-01 MED ORDER — ALBUMIN HUMAN 5 % IV SOLN
INTRAVENOUS | Status: DC | PRN
  Administered 2013-09-01: 18:00:00 via INTRAVENOUS

## 2013-09-01 MED ORDER — MIDAZOLAM HCL 2 MG/2ML IJ SOLN
INTRAMUSCULAR | Status: AC
  Filled 2013-09-01: qty 2

## 2013-09-01 MED ORDER — SUCCINYLCHOLINE CHLORIDE 20 MG/ML IJ SOLN
INTRAMUSCULAR | Status: DC | PRN
  Administered 2013-09-01: 120 mg via INTRAVENOUS

## 2013-09-01 MED ORDER — ONDANSETRON HCL 4 MG/2ML IJ SOLN
INTRAMUSCULAR | Status: DC | PRN
  Administered 2013-09-01: 4 mg via INTRAVENOUS

## 2013-09-01 MED ORDER — POTASSIUM CHLORIDE IN NACL 20-0.45 MEQ/L-% IV SOLN
INTRAVENOUS | Status: DC
  Administered 2013-09-01: 21:00:00 via INTRAVENOUS
  Filled 2013-09-01 (×6): qty 1000

## 2013-09-01 MED ORDER — ONDANSETRON HCL 4 MG PO TABS: 4.0000 mg | ORAL_TABLET | Freq: Four times a day (QID) | ORAL | Status: DC | PRN

## 2013-09-01 MED ORDER — SENNA 8.6 MG PO TABS
1.0000 | ORAL_TABLET | Freq: Two times a day (BID) | ORAL | Status: DC
  Administered 2013-09-02 – 2013-09-04 (×5): 8.6 mg via ORAL
  Filled 2013-09-01 (×7): qty 1

## 2013-09-01 MED ORDER — SUCCINYLCHOLINE CHLORIDE 20 MG/ML IJ SOLN
INTRAMUSCULAR | Status: AC
  Filled 2013-09-01: qty 1

## 2013-09-01 MED ORDER — BISACODYL 10 MG RE SUPP
10.0000 mg | Freq: Every day | RECTAL | Status: DC | PRN
Start: 2013-09-01 — End: 2013-09-04

## 2013-09-01 MED ORDER — ARTIFICIAL TEARS OP OINT
TOPICAL_OINTMENT | OPHTHALMIC | Status: DC | PRN
  Administered 2013-09-01: 1 via OPHTHALMIC

## 2013-09-01 MED ORDER — ARTIFICIAL TEARS OP OINT
TOPICAL_OINTMENT | OPHTHALMIC | Status: AC
  Filled 2013-09-01: qty 3.5

## 2013-09-01 MED ORDER — ONDANSETRON HCL 4 MG/2ML IJ SOLN: 4.0000 mg | Freq: Four times a day (QID) | INTRAMUSCULAR | Status: DC | PRN

## 2013-09-01 MED ORDER — HYDROCODONE-ACETAMINOPHEN 5-325 MG PO TABS: 1.0000 | ORAL_TABLET | Freq: Four times a day (QID) | ORAL | Status: DC | PRN

## 2013-09-01 MED ORDER — ENOXAPARIN SODIUM 40 MG/0.4ML ~~LOC~~ SOLN: 40.0000 mg | SUBCUTANEOUS | Status: DC

## 2013-09-01 MED ORDER — MAGNESIUM CITRATE PO SOLN: 1.0000 | Freq: Once | ORAL | Status: AC | PRN

## 2013-09-01 MED ORDER — METOCLOPRAMIDE HCL 5 MG/ML IJ SOLN: 5.0000 mg | Freq: Three times a day (TID) | INTRAMUSCULAR | Status: DC | PRN

## 2013-09-01 MED ORDER — ACETAMINOPHEN 325 MG PO TABS
650.0000 mg | ORAL_TABLET | Freq: Four times a day (QID) | ORAL | Status: DC | PRN
Start: 2013-09-01 — End: 2013-09-04

## 2013-09-01 MED ORDER — CEFAZOLIN SODIUM 1-5 GM-% IV SOLN
1.0000 g | Freq: Four times a day (QID) | INTRAVENOUS | Status: AC
  Administered 2013-09-02 (×2): 1 g via INTRAVENOUS
  Filled 2013-09-01 (×2): qty 50

## 2013-09-01 MED ORDER — PHENOL 1.4 % MT LIQD: 1.0000 | OROMUCOSAL | Status: DC | PRN

## 2013-09-01 MED ORDER — ONDANSETRON HCL 4 MG/2ML IJ SOLN
INTRAMUSCULAR | Status: AC
  Filled 2013-09-01: qty 2

## 2013-09-01 MED ORDER — ENOXAPARIN SODIUM 40 MG/0.4ML ~~LOC~~ SOLN
40.0000 mg | SUBCUTANEOUS | Status: DC
  Administered 2013-09-02 – 2013-09-04 (×3): 40 mg via SUBCUTANEOUS
  Filled 2013-09-01 (×5): qty 0.4

## 2013-09-01 MED ORDER — LIDOCAINE HCL (CARDIAC) 20 MG/ML IV SOLN
INTRAVENOUS | Status: AC
  Filled 2013-09-01: qty 5

## 2013-09-01 MED ORDER — VECURONIUM BROMIDE 10 MG IV SOLR
INTRAVENOUS | Status: AC
  Filled 2013-09-01: qty 10

## 2013-09-01 MED ORDER — PROPOFOL 10 MG/ML IV BOLUS
INTRAVENOUS | Status: AC
  Filled 2013-09-01: qty 20

## 2013-09-01 MED ORDER — 0.9 % SODIUM CHLORIDE (POUR BTL) OPTIME
TOPICAL | Status: DC | PRN
  Administered 2013-09-01: 1000 mL

## 2013-09-01 MED ORDER — LIDOCAINE HCL (CARDIAC) 20 MG/ML IV SOLN
INTRAVENOUS | Status: DC | PRN
  Administered 2013-09-01: 60 mg via INTRAVENOUS

## 2013-09-01 MED ORDER — HYDROCODONE-ACETAMINOPHEN 5-325 MG PO TABS
1.0000 | ORAL_TABLET | Freq: Four times a day (QID) | ORAL | Status: DC | PRN
  Administered 2013-09-02 – 2013-09-03 (×3): 2 via ORAL
  Filled 2013-09-01 (×3): qty 2

## 2013-09-01 SURGICAL SUPPLY — 43 items
BENZOIN TINCTURE PRP APPL 2/3 (GAUZE/BANDAGES/DRESSINGS) ×2 IMPLANT
BIT DRILL 4.0X195MM (BIT) ×1 IMPLANT
BIT DRILL 60/10 CANN QC (BIT) ×2 IMPLANT
BIT DRILL CANN LRG QC 17.0X300 (BIT) ×2 IMPLANT
BLADE INTRAMED NAIL 11X85 (Orthopedic Implant) ×2 IMPLANT
BOOTCOVER CLEANROOM LRG (PROTECTIVE WEAR) IMPLANT
CLOTH BEACON ORANGE TIMEOUT ST (SAFETY) IMPLANT
CLSR STERI-STRIP ANTIMIC 1/2X4 (GAUZE/BANDAGES/DRESSINGS) IMPLANT
COVER SURGICAL LIGHT HANDLE (MISCELLANEOUS) IMPLANT
DRAPE STERI IOBAN 125X83 (DRAPES) ×2 IMPLANT
DRILL BIT 4.0X195MM (BIT) ×1
DRSG MEPILEX BORDER 4X4 (GAUZE/BANDAGES/DRESSINGS) ×2 IMPLANT
DRSG MEPILEX BORDER 4X8 (GAUZE/BANDAGES/DRESSINGS) IMPLANT
DURAPREP 26ML APPLICATOR (WOUND CARE) ×2 IMPLANT
ELECT CAUTERY BLADE 6.4 (BLADE) ×2 IMPLANT
ELECT REM PT RETURN 9FT ADLT (ELECTROSURGICAL) ×2
ELECTRODE REM PT RTRN 9FT ADLT (ELECTROSURGICAL) ×1 IMPLANT
EVACUATOR 1/8 PVC DRAIN (DRAIN) IMPLANT
FACESHIELD LNG OPTICON STERILE (SAFETY) IMPLANT
GAUZE XEROFORM 5X9 LF (GAUZE/BANDAGES/DRESSINGS) IMPLANT
GLOVE BIOGEL PI ORTHO PRO SZ8 (GLOVE) ×1
GLOVE ORTHO TXT STRL SZ7.5 (GLOVE) ×4 IMPLANT
GLOVE PI ORTHO PRO STRL SZ8 (GLOVE) ×1 IMPLANT
GLOVE SURG ORTHO 8.0 STRL STRW (GLOVE) ×4 IMPLANT
GOWN STRL NON-REIN LRG LVL3 (GOWN DISPOSABLE) IMPLANT
GUIDEWIRE 3.2X400 (WIRE) ×2 IMPLANT
KIT ROOM TURNOVER OR (KITS) ×2 IMPLANT
MANIFOLD NEPTUNE II (INSTRUMENTS) IMPLANT
NAIL TROCH 11MMX130X360MM (Nail) ×2 IMPLANT
NS IRRIG 1000ML POUR BTL (IV SOLUTION) ×2 IMPLANT
PACK GENERAL/GYN (CUSTOM PROCEDURE TRAY) ×2 IMPLANT
PAD ARMBOARD 7.5X6 YLW CONV (MISCELLANEOUS) ×8 IMPLANT
SCREW LOCK TI 5.0X38 F/IM NAIL (Screw) ×2 IMPLANT
STAPLER VISISTAT 35W (STAPLE) ×2 IMPLANT
STRIP CLOSURE SKIN 1/2X4 (GAUZE/BANDAGES/DRESSINGS) ×2 IMPLANT
SUT VIC AB 0 CTB1 27 (SUTURE) ×2 IMPLANT
SUT VIC AB 2-0 FS1 27 (SUTURE) ×2 IMPLANT
SUT VIC AB 2-0 SH 27 (SUTURE)
SUT VIC AB 2-0 SH 27XBRD (SUTURE) IMPLANT
SUT VIC AB 3-0 SH 8-18 (SUTURE) ×2 IMPLANT
TOWEL OR 17X24 6PK STRL BLUE (TOWEL DISPOSABLE) ×2 IMPLANT
TOWEL OR 17X26 10 PK STRL BLUE (TOWEL DISPOSABLE) ×2 IMPLANT
WATER STERILE IRR 1000ML POUR (IV SOLUTION) ×2 IMPLANT

## 2013-09-01 NOTE — Progress Notes (Signed)
[facility]                                                                              Patient Demographics  Tammie [name], is a 80 y.o. female, DOB - [birth year], ZHG:[id number]  Admit date - 08/31/2013   Admitting Physician [name], MD  Outpatient Primary MD for the patient is [name], [name], MD  LOS - 1   Chief Complaint  Patient presents with  . Fall        Assessment & Plan  Intertrochanteric fracture of right hip secondary to mechanical fall  -Dr. Mardelle Matte, ortho consulted -Pending preop cardiac assessment -PT/OT consulted for post surgery -Will likely need SNF, social work consulted  Sinus bradycardia  -Cardiology consulted -Pending echocardiogram -Avoid AV Nodal blocking agents  Hypothyroidism  -Continue Synthroid.  -TSH level pending  Hypertension  -Continue patient's home medications of amlodipine, benazepril, furosemide.   Hyperlipidemia  -Continue simvastatin   Carotid artery stenosis  -Holding Plavix as well as aspirin for surgery. -Cardiology consulted   Depression anxiety  -Continue patient's fluoxetine.   Osteoporosis  -Continue patient's Fosamax. Will hold her vitamin D3 as well as calcium carbonate.   History of breast cancer  -Continue Arimidex.   Code Status: Full  Family Communication: None at bedside  Disposition Plan: Admitted, will likely need rehab or SNF.  Time Spent in minutes   30 minutes  Procedures  Echocardiogram  Consults   Cardiology Orthopedic Surgery  DVT Prophylaxis  Lovenox   Lab Results  Component Value Date   PLT 170 09/01/2013    Medications  Scheduled Meds: . amLODipine  10 mg Oral Daily  . anastrozole  1 mg Oral Daily  . benazepril  10 mg Oral Daily  . docusate sodium  100 mg Oral BID  . enoxaparin (LOVENOX) injection  40 mg Subcutaneous Q24H  . FLUoxetine  20 mg Oral Daily  . furosemide  40 mg Oral Daily  . levothyroxine  88 mcg Oral QAC breakfast  . senna  1 tablet Oral BID  .  simvastatin  20 mg Oral QHS   Continuous Infusions:  PRN Meds:.HYDROcodone-acetaminophen, methocarbamol (ROBAXIN) IV, methocarbamol, morphine injection, morphine injection  Antibiotics    Anti-infectives   None        Subjective:   [name] seen and examined today.  Patient states her hip pain is better today and is anxious about her upcoming surgery.    Objective:   Filed Vitals:   08/31/13 1613 08/31/13 1800 08/31/13 2300 09/01/13 0600  BP: 151/46 166/77 166/48 164/48  Pulse: 55 57 65 58  Temp:  98.3 F (36.8 C) 98.9 F (37.2 C) 98.7 F (37.1 C)  TempSrc:      Resp: 20 22 18 18   Height:      Weight:      SpO2: 100% 96% 94% 93%    Wt Readings from Last 3 Encounters:  08/31/13 54.432 kg (120 lb)  08/31/13 54.432 kg (120 lb)  07/03/13 55.14 kg (121 lb 9 oz)     Intake/Output Summary (Last 24 hours) at 09/01/13 0939 Last data filed at 08/31/13 2300  Gross per 24 hour  Intake  240 ml  Output      1 ml  Net    239 ml    Exam General: Well developed, well nourished, NAD, appears stated age  HEENT: NCAT, PERRLA, EOMI, Anicteic Sclera, mucous membranes moist.  Neck: Supple, no JVD, no masses  Cardiovascular: S1 S2 auscultated, bradycardic, 2/6SEM  Respiratory: Clear to auscultation bilaterally with equal chest rise  Abdomen: Soft, nontender, nondistended, + bowel sounds  Extremities: warm dry without cyanosis clubbing or edema. Tenderness to palpation of the right hip. Right foot, plantar surface has callus.  Neuro: AAOx3, cranial nerves grossly intact. Skin: Without rashes exudates or nodules  Psych: Normal affect and demeanor with intact judgement and insight   Data Review   Micro Results No results found for this or any previous visit (from the past 240 hour(s)).  Radiology Reports Dg Hip Complete Right  08/31/2013   CLINICAL DATA:  Right hip pain status post fall  EXAM: RIGHT HIP - COMPLETE 2+ VIEW  COMPARISON:  None.  FINDINGS: There is a  nondisplaced right intertrochanteric fracture with a fracture cleft extending into the proximal femoral diaphysis. There are mild osteoarthritic changes of the right hip. There is no dislocation.  The left hip is unremarkable.  There is degenerative disc disease of the lower lumbar spine.  The SI joints are unremarkable.  There is a coarsely calcified left pelvic mass likely representing a calcified uterine fibroid.  IMPRESSION: Nondisplaced right intertrochanteric fracture with fracture cleft extending into the proximal femoral diaphysis. No dislocation.  Mild osteoarthritis of the right hip.   Electronically Signed   By: Kathreen Devoid   On: 08/31/2013 13:42   Dg Chest Portable 1 View  08/31/2013   CLINICAL DATA:  Preoperative evaluation for hip surgery, history of hypertension, breast cancer, former smoker  EXAM: PORTABLE CHEST - 1 VIEW  COMPARISON:  Portable exam 1432 hr compared to 02/17/2011  FINDINGS: Normal heart size, mediastinal contours and pulmonary vascularity for technique.  Atherosclerotic calcification aorta.  Mild emphysematous changes.  No acute infiltrate, pleural effusion or pneumothorax.  Bones unremarkable.  IMPRESSION: Mild emphysematous changes.  No acute abnormalities.   Electronically Signed   By: Lavonia Dana M.D.   On: 08/31/2013 14:56    CBC  Recent Labs Lab 08/31/13 1421 09/01/13 0418  WBC 11.7* 11.3*  HGB 13.2 12.1  HCT 39.2 36.8  PLT 165 170  MCV 93.3 93.9  MCH 31.4 30.9  MCHC 33.7 32.9  RDW 14.1 14.4  LYMPHSABS 1.0  --   MONOABS 0.6  --   EOSABS 0.0  --   BASOSABS 0.0  --     Chemistries   Recent Labs Lab 08/31/13 1421 09/01/13 0418  NA 140 144  K 4.2 4.0  CL 104 108  CO2 22 22  GLUCOSE 131* 117*  BUN 19 14  CREATININE 1.01 1.01  CALCIUM 9.1 9.1   ------------------------------------------------------------------------------------------------------------------ estimated creatinine clearance is 36.3 ml/min (by C-G formula based on Cr of  1.01). ------------------------------------------------------------------------------------------------------------------ No results found for this basename: HGBA1C,  in the last 72 hours ------------------------------------------------------------------------------------------------------------------ No results found for this basename: CHOL, HDL, LDLCALC, TRIG, CHOLHDL, LDLDIRECT,  in the last 72 hours ------------------------------------------------------------------------------------------------------------------ No results found for this basename: TSH, T4TOTAL, FREET3, T3FREE, THYROIDAB,  in the last 72 hours ------------------------------------------------------------------------------------------------------------------ No results found for this basename: VITAMINB12, FOLATE, FERRITIN, TIBC, IRON, RETICCTPCT,  in the last 72 hours  Coagulation profile  Recent Labs Lab 08/31/13 1421  INR 1.10    No results  found for this basename: DDIMER,  in the last 72 hours  Cardiac Enzymes No results found for this basename: CK, CKMB, TROPONINI, MYOGLOBIN,  in the last 168 hours ------------------------------------------------------------------------------------------------------------------ No components found with this basename: POCBNP,     Timmothy Baranowski D.O. on 09/01/2013 at 9:39 AM  Between 7am to 7pm - Pager - [phone]  After 7pm go to [email or web address] - password [email or web address]  And look for the night coverage person covering for me after hours  [facility] Office  [phone]

## 2013-09-01 NOTE — Transfer of Care (Signed)
Immediate Anesthesia Transfer of Care Note  Patient: Tammie [name]  Procedure(s) Performed: Procedure(s): INTRAMEDULLARY (IM) NAIL FEMORAL (Right)  Patient Location: PACU  Anesthesia Type:General  Level of Consciousness: awake, oriented, sedated, patient cooperative and responds to stimulation  Airway & Oxygen Therapy: Patient Spontanous Breathing and Patient connected to nasal cannula oxygen  Post-op Assessment: Report given to PACU RN, Post -op Vital signs reviewed and stable, Patient moving all extremities and Patient moving all extremities X 4  Post vital signs: Reviewed and stable  Complications: No apparent anesthesia complications

## 2013-09-01 NOTE — Progress Notes (Deleted)
Pt lives alone,states wants to remain full code status.Has no family but has one close friend.Valuables taken to security office pts copy with a key in front of chart.Tammie [name] D  

## 2013-09-01 NOTE — Preoperative (Signed)
Beta Blockers   Reason not to administer Beta Blockers:Not Applicable 

## 2013-09-01 NOTE — Anesthesia Preprocedure Evaluation (Signed)
Anesthesia Evaluation  Patient identified by MRN, date of birth, ID band Patient awake    Reviewed: Allergy & Precautions, H&P , NPO status , Patient's Chart, lab work & pertinent test results  Airway Mallampati: II      Dental   Pulmonary former smoker,  breath sounds clear to auscultation        Cardiovascular hypertension, + Past MI and + Peripheral Vascular Disease Rhythm:Regular Rate:Normal     Neuro/Psych    GI/Hepatic GERD-  ,  Endo/Other    Renal/GU negative Renal ROS     Musculoskeletal   Abdominal   Peds  Hematology   Anesthesia Other Findings   Reproductive/Obstetrics                           Anesthesia Physical Anesthesia Plan  ASA: III  Anesthesia Plan: General   Post-op Pain Management:    Induction: Intravenous  Airway Management Planned: Oral ETT  Additional Equipment:   Intra-op Plan:   Post-operative Plan: Extubation in OR  Informed Consent: I have reviewed the patients History and Physical, chart, labs and discussed the procedure including the risks, benefits and alternatives for the proposed anesthesia with the patient or authorized representative who has indicated his/her understanding and acceptance.   Dental advisory given  Plan Discussed with: CRNA and Anesthesiologist  Anesthesia Plan Comments:         Anesthesia Quick Evaluation

## 2013-09-01 NOTE — Progress Notes (Signed)
Dr Mardelle Matte and Dr Oleta Mouse notified that for 2D Echo- ordered by Dr Haroldine Laws  to be done, prior to ortho surgery,Physician must call Hospital Operator to page vascular tech on call.Dennie Bible RN

## 2013-09-01 NOTE — Progress Notes (Signed)
The patient has been re-examined, and the chart reviewed, and there have been no interval changes to the documented history and physical.    Spoke with cardiology, echo OK.  Plan to proceed with surgery.  The risks benefits and alternatives were discussed with the patient including but not limited to the risks of nonoperative treatment, versus surgical intervention including infection, bleeding, nerve injury, malunion, nonunion, the need for revision surgery, hardware prominence, hardware failure, the need for hardware removal, blood clots, cardiopulmonary complications, morbidity, mortality, among others, and they were willing to proceed.    She is much better from a mental status standpoint and understands everything.    [name], MD

## 2013-09-01 NOTE — Progress Notes (Signed)
PT Cancellation Note  Patient Details Name: Tammie [name] MRN: [id number] DOB: July 22, 1934   Cancelled Treatment:    Reason Eval/Treat Not Completed: Medical issues which prohibited therapy   Orders received, chart reviewed  Noted pt is getting preop clearance  Will proceed with PT eval postop with incr activity orders and WBing status/precautions as ordered.  Thanks,  [place], [place]    [place] 09/01/2013, 10:14 AM

## 2013-09-01 NOTE — Op Note (Signed)
DATE OF SURGERY:  09/01/2013  TIME: 6:46 PM  PATIENT NAME:  Tammie [name]  AGE: 80 y.o.  PRE-OPERATIVE DIAGNOSIS:  Right intertrochanteric hip fracture  POST-OPERATIVE DIAGNOSIS:  Right reverse obliquity intertrochanteric hip fracture  PROCEDURE:  INTRAMEDULLARY (IM) NAIL FEMORAL  SURGEON:  Allie Gerhold P  ASSISTANT:  Joya Gaskins, OPA-C, present and scrubbed throughout the case, critical for assistance with exposure, retraction, instrumentation, and closure.  OPERATIVE IMPLANTS: Synthes trochanteric femoral nail with interlocking helical blade into the femoral head and a single distal interlocking bolt  PREOPERATIVE INDICATIONS:  Tammie [name] is a 80 y.o. year old who fell and suffered a hip fracture. She was brought into the ER and then admitted and optimized and then elected for surgical intervention.    The risks benefits and alternatives were discussed with the patient including but not limited to the risks of nonoperative treatment, versus surgical intervention including infection, bleeding, nerve injury, malunion, nonunion, hardware prominence, hardware failure, need for hardware removal, blood clots, cardiopulmonary complications, morbidity, mortality, among others, and they were willing to proceed.  She is on preoperative aspirin and Plavix, with significant risk factors including carotid stenosis, optimized by cardiology prior to proceeding.  OPERATIVE PROCEDURE:  The patient was brought to the operating room and placed in the supine position. General anesthesia was administered, with a foley. She was placed on the fracture table.  Closed reduction was performed under C-arm guidance. The length of the femur was also measured using fluoroscopy. Time out was then performed after sterile prep and drape. She received preoperative antibiotics.  Incision was made proximal to the greater trochanter. A guidewire was placed in the appropriate position. Confirmation was made on AP  and lateral views. The above-named nail was opened. I opened the proximal femur with a reamer. I then placed the nail by hand easily down. I did not need to ream the femur.  Once the nail was completely seated, I placed a guidepin into the femoral head into the center center position. The shape of her neck and head was slightly abnormal, with a small amount of dysplasia, but I was able to get the guidewire into the center portion of the head. I measured the length, and then reamed the lateral cortex and up into the head. I then placed the helical blade. Slight compression was applied. Anatomic fixation achieved. Bone quality was mediocre.  I then secured the proximal interlocking bolt, and took off a half a turn, and then removed the instruments, secured the nail distally with a perfect circles technique, allowing for some slight future dynamization if needed, and took final C-arm pictures AP and lateral the entire length of the leg. Anatomic reconstruction was achieved, and the wounds were irrigated copiously and closed with Vicryl followed by Steri-Strips and sterile gauze for the skin. The patient was awakened and returned to PACU in stable and satisfactory condition. There no complications and the patient tolerated the procedure well.  She will be weightbearing as tolerated, and will be on Lovenox  for a period of three weeks after discharge.   Marchia Bond, M.D.

## 2013-09-01 NOTE — Progress Notes (Signed)
  Echocardiogram 2D Echocardiogram has been performed.  Mauricio Po 09/01/2013, 8:47 AM

## 2013-09-01 NOTE — Anesthesia Postprocedure Evaluation (Signed)
  Anesthesia Post-op Note  Patient: Tammie [name]  Procedure(s) Performed: Procedure(s): INTRAMEDULLARY (IM) NAIL FEMORAL (Right)  Patient Location: PACU  Anesthesia Type:General  Level of Consciousness: awake  Airway and Oxygen Therapy: Patient Spontanous Breathing  Post-op Pain: mild  Post-op Assessment: Post-op Vital signs reviewed  Post-op Vital Signs: Reviewed  Complications: No apparent anesthesia complications

## 2013-09-01 NOTE — Discharge Instructions (Signed)
Diet: As you were doing prior to hospitalization  ° °Shower:  May shower but keep the wounds dry, use an occlusive plastic wrap, NO SOAKING IN TUB.  If the bandage gets wet, change with a clean dry gauze. ° °Dressing:  You may change your dressing 3-5 days after surgery.  Then change the dressing daily with sterile gauze dressing.   ° °There are sticky tapes (steri-strips) on your wounds and all the stitches are absorbable.  Leave the steri-strips in place when changing your dressings, they will peel off with time, usually 2-3 weeks. ° °Activity:  Increase activity slowly as tolerated, but follow the weight bearing instructions below.  No lifting or driving for 6 weeks. ° °Weight Bearing:   As tolerated.   ° °To prevent constipation: you may use a stool softener such as - ° °Colace (over the counter) 100 mg by mouth twice a day  °Drink plenty of fluids (prune juice may be helpful) and high fiber foods °Miralax (over the counter) for constipation as needed.   ° °Itching:  If you experience itching with your medications, try taking only a single pain pill, or even half a pain pill at a time.  You may take up to 10 pain pills per day, and you can also use benadryl over the counter for itching or also to help with sleep.  ° °Precautions:  If you experience chest pain or shortness of breath - call 911 immediately for transfer to the hospital emergency department!! ° °If you develop a fever greater that 101 F, purulent drainage from wound, increased redness or drainage from wound, or calf pain -- Call the office at [phone]                                                °Follow- Up Appointment:  Please call for an appointment to be seen in 2 weeks [place] - [phone] ° ° ° ° ° °

## 2013-09-01 NOTE — Progress Notes (Signed)
Pt lives alone,states wants to remain full code status.Has no family but has one close friend.Valuables taken to security office pts copy with a key in front of chart.Aggie Moats D

## 2013-09-01 NOTE — Progress Notes (Addendum)
Echo read:  Study Conclusions  - Left ventricle: The cavity size was normal. Wall thickness was normal. Systolic function was normal. The estimated ejection fraction was in the range of 60% to 65%. Wall motion was normal; there were no regional wall motion abnormalities. Doppler parameters are consistent with abnormal left ventricular relaxation (grade 1 diastolic dysfunction). - Aortic valve: Mildly calcified annulus. Trileaflet; mildly calcified leaflets. Moderate calcification involving the noncoronary cusp. No significant regurgitation. - Mitral valve: Calcified annulus. Trivial regurgitation. - Left atrium: The atrium was mildly dilated. - Right atrium: Central venous pressure: 66mm Hg (est). - Atrial septum: No defect or patent foramen ovale was identified. - Tricuspid valve: Trivial regurgitation. - Pulmonary arteries: Systolic pressure could not be accurately estimated. - Pericardium, extracardiac: A prominent pericardial fat pad was present.  May proceed to OR today as planned - low-risk for cardiac morbidity/mortality.  Cont antihypertensive and statin therapy throughout perioperative period.  Resume ASA/plavix when feasible per ortho.  Murray Hodgkins, NP 09/01/2013, 2:57 PM

## 2013-09-01 NOTE — Anesthesia Procedure Notes (Signed)
Procedure Name: Intubation Date/Time: 09/01/2013 5:33 PM Performed by: Jacquiline Doe A Pre-anesthesia Checklist: Patient identified, Timeout performed, Emergency Drugs available, Suction available and Patient being monitored Patient Re-evaluated:Patient Re-evaluated prior to inductionOxygen Delivery Method: Circle system utilized Preoxygenation: Pre-oxygenation with 100% oxygen Intubation Type: IV induction and Cricoid Pressure applied Ventilation: Mask ventilation without difficulty Laryngoscope Size: Mac and 3 Grade View: Grade I Tube type: Oral Tube size: 7.5 mm Number of attempts: 1 Airway Equipment and Method: Stylet Placement Confirmation: ETT inserted through vocal cords under direct vision,  breath sounds checked- equal and bilateral and positive ETCO2 Secured at: 21 cm Tube secured with: Tape Dental Injury: Teeth and Oropharynx as per pre-operative assessment

## 2013-09-01 NOTE — Progress Notes (Signed)
    Consulting cardiologist: Dr. Levonne Spiller  Subjective:    No chest pain or dizziness. Has hip pain.   Objective:   Temp:  [98.2 F (36.8 C)-98.9 F (37.2 C)] 98.7 F (37.1 C) (02/01 0600) Pulse Rate:  [39-65] 58 (02/01 0600) Resp:  [11-22] 18 (02/01 0600) BP: (144-166)/(45-91) 164/48 mmHg (02/01 0600) SpO2:  [91 %-100 %] 93 % (02/01 0600) Weight:  [120 lb (54.432 kg)] 120 lb (54.432 kg) (01/31 1218) Last BM Date: 08/31/13  Filed Weights   08/31/13 1218  Weight: 120 lb (54.432 kg)    Intake/Output Summary (Last 24 hours) at 09/01/13 0807 Last data filed at 08/31/13 2300  Gross per 24 hour  Intake    240 ml  Output      1 ml  Net    239 ml    Telemetry: Sinus rhythm and sinus bradycardia, no significant pauses.  Exam:  General: No distress.  Lungs: Clear, nonlabored.  Cardiac: RRR, 2/6 systolic murmur, no gallop.  Abdomen: NABS.  Extremities: No pitting.   Lab Results:  Basic Metabolic Panel:  Recent Labs Lab 08/31/13 1421 09/01/13 0418  NA 140 144  K 4.2 4.0  CL 104 108  CO2 22 22  GLUCOSE 131* 117*  BUN 19 14  CREATININE 1.01 1.01  CALCIUM 9.1 9.1    CBC:  Recent Labs Lab 08/31/13 1421 09/01/13 0418  WBC 11.7* 11.3*  HGB 13.2 12.1  HCT 39.2 36.8  MCV 93.3 93.9  PLT 165 170    ECG: Sinus bradycardia with RBBB.   Medications:   Scheduled Medications: . amLODipine  10 mg Oral Daily  . anastrozole  1 mg Oral Daily  . benazepril  10 mg Oral Daily  . docusate sodium  100 mg Oral BID  . enoxaparin (LOVENOX) injection  40 mg Subcutaneous Q24H  . FLUoxetine  20 mg Oral Daily  . furosemide  40 mg Oral Daily  . levothyroxine  88 mcg Oral QAC breakfast  . senna  1 tablet Oral BID  . simvastatin  20 mg Oral QHS      PRN Medications:  HYDROcodone-acetaminophen, methocarbamol (ROBAXIN) IV, methocarbamol, morphine injection, morphine injection   Assessment:   1. Sinus bradycardia. Rhythm has been stable on telemetry, no  significant pauses. ECG with RBBB. Echocardiogram just completed this AM.  2. Status post mechanical fall with right hip fracture.  3. Nonobstructive carotid artery disease.  Plan/Discussion:    Continue current medications - none should be contributing to bradycardia. She was seen for preoperative assessment by Dr. Haroldine Laws yesterday, please see his recommendations. Will review echocardiogram this AM. Unless there are major abnormalities that require further evaluation, she can proceed with hip surgery today. We will follow.   Satira Sark, M.D., F.A.C.C.

## 2013-09-02 LAB — CBC
HEMATOCRIT: 31.5 % — AB (ref 36.0–46.0)
Hemoglobin: 10.4 g/dL — ABNORMAL LOW (ref 12.0–15.0)
MCH: 31 pg (ref 26.0–34.0)
MCHC: 33 g/dL (ref 30.0–36.0)
MCV: 93.8 fL (ref 78.0–100.0)
Platelets: 124 10*3/uL — ABNORMAL LOW (ref 150–400)
RBC: 3.36 MIL/uL — AB (ref 3.87–5.11)
RDW: 14.4 % (ref 11.5–15.5)
WBC: 11.2 10*3/uL — ABNORMAL HIGH (ref 4.0–10.5)

## 2013-09-02 LAB — BASIC METABOLIC PANEL
BUN: 14 mg/dL (ref 6–23)
CALCIUM: 8.4 mg/dL (ref 8.4–10.5)
CO2: 22 mEq/L (ref 19–32)
CREATININE: 0.91 mg/dL (ref 0.50–1.10)
Chloride: 106 mEq/L (ref 96–112)
GFR calc Af Amer: 68 mL/min — ABNORMAL LOW (ref >90)
GFR, EST NON AFRICAN AMERICAN: 59 mL/min — AB (ref >90)
GLUCOSE: 131 mg/dL — AB (ref 70–99)
Potassium: 4.7 mEq/L (ref 3.7–5.3)
Sodium: 142 mEq/L (ref 137–147)

## 2013-09-02 MED ORDER — BOOST / RESOURCE BREEZE PO LIQD
1.0000 | Freq: Two times a day (BID) | ORAL | Status: DC
  Administered 2013-09-02 – 2013-09-04 (×3): 1 via ORAL

## 2013-09-02 NOTE — Care Management Note (Signed)
CARE MANAGEMENT NOTE 09/02/2013  Patient:  Tammie [name],Tammie [name]   Account Number:  [id number]  Date Initiated:  09/02/2013  Documentation initiated by:  Zaki Gertsch  Subjective/Objective Assessment:   80 yr old female s/p right hip fracture with  IM nailing.     Action/Plan:   CM awaiting CIR reccommendation. Will follow   Anticipated DC Date:     Anticipated DC Plan:           Choice offered to / List presented to:             Status of service:  In process, will continue to follow  

## 2013-09-02 NOTE — Progress Notes (Signed)
Rehab Admissions Coordinator Note:  Patient was screened by Retta Diones for appropriateness for an Inpatient Acute Rehab Consult.  At this time, an inpatient rehab consult has been ordered and is pending completion.  Retta Diones 09/02/2013, 2:49 PM  I can be reached at [phone].

## 2013-09-02 NOTE — Care Management Note (Signed)
CARE MANAGEMENT NOTE 09/02/2013  Patient:  Tammie [name], Tammie [name]   Account Number:  [id number]  Date Initiated:  09/02/2013  Documentation initiated by:  Ricki Miller  Subjective/Objective Assessment:   80 yr old female s/p right hip fracture with  IM nailing.     Action/Plan:   CM awaiting CIR reccommendation. Will follow   Anticipated DC Date:     Anticipated DC Plan:           Choice offered to / List presented to:             Status of service:  In process, will continue to follow

## 2013-09-02 NOTE — Progress Notes (Signed)
Utilization review completed.  

## 2013-09-02 NOTE — Progress Notes (Signed)
INITIAL NUTRITION ASSESSMENT  DOCUMENTATION CODES Per approved criteria  -Not Applicable   INTERVENTION: Resource Breeze po BID, each supplement provides 250 kcal and 9 grams of protein  NUTRITION DIAGNOSIS: Increased nutrient needs related to surgery as evidenced by healing.   Goal: Pt to meet >/= 90% of their estimated nutrition needs   Monitor:  Wt, po intake, labs  Reason for Assessment: Consult  80 y.o. female  Admitting Dx: Hip fracture, intertrochanteric  ASSESSMENT: 80 y.o. female with a history of coronary artery disease, carotid artery stenosis, hypertension, breast cancer, that presents emergency department after falling this afternoon.   Pt reports that she has been eating well. She says that she does yoga and eats Activia yogurt daily as a supplement to her diet. She has had no recent weight changes. Pt said that she would like to try Resource Breeze to aid in healing.  Height: Ht Readings from Last 1 Encounters:  09/01/13 5' 2.25" (1.581 m)    Weight: Wt Readings from Last 1 Encounters:  09/01/13 120 lb (54.432 kg)    Ideal Body Weight: 50.7 kg  % Ideal Body Weight: 107%  Wt Readings from Last 10 Encounters:  09/01/13 120 lb (54.432 kg)  09/01/13 120 lb (54.432 kg)  07/03/13 121 lb 9 oz (55.14 kg)  01/18/13 119 lb 3.2 oz (54.069 kg)  11/29/12 118 lb (53.524 kg)  08/30/12 118 lb (53.524 kg)  08/06/12 118 lb 9.6 oz (53.797 kg)  04/06/12 113 lb 2 oz (51.313 kg)  02/23/12 114 lb 12.8 oz (52.073 kg)  01/13/12 112 lb 14.4 oz (51.211 kg)    Usual Body Weight: 120 lbs  % Usual Body Weight: 100%  BMI:  Body mass index is 21.78 kg/(m^2).  Estimated Nutritional Needs: Kcal: 1350-1650 Protein: 70-80 g Fluid: 1.7 L/day  Skin: incision on right leg  Diet Order: General  EDUCATION NEEDS: -Education needs addressed   Intake/Output Summary (Last 24 hours) at 09/02/13 1353 Last data filed at 09/02/13 1237  Gross per 24 hour  Intake   2180 ml   Output   2350 ml  Net   -170 ml    Last BM: none recorded   Labs:   Recent Labs Lab 08/31/13 1421 09/01/13 0418 09/02/13 0526  NA 140 144 142  K 4.2 4.0 4.7  CL 104 108 106  CO2 22 22 22   BUN 19 14 14   CREATININE 1.01 1.01 0.91  CALCIUM 9.1 9.1 8.4  GLUCOSE 131* 117* 131*    CBG (last 3)  No results found for this basename: GLUCAP,  in the last 72 hours  Scheduled Meds: . amLODipine  10 mg Oral Daily  . anastrozole  1 mg Oral Daily  . benazepril  10 mg Oral Daily  . docusate sodium  100 mg Oral BID  . enoxaparin (LOVENOX) injection  40 mg Subcutaneous Q24H  . FLUoxetine  20 mg Oral Daily  . furosemide  40 mg Oral Daily  . levothyroxine  88 mcg Oral QAC breakfast  . senna  1 tablet Oral BID    Continuous Infusions: . 0.45 % NaCl with KCl 20 mEq / L 75 mL/hr at 09/01/13 2030    Past Medical History  Diagnosis Date  . Breast lump     left  . Depression   . Hyperlipidemia   . Hypertension   . GERD (gastroesophageal reflux disease)   . Cancer     breast  . Breast cancer T1aN0 S/P lumpectomy SLN Bx 01/2011  03/25/2011  . Myocardial infarction 1999    mi  . Bilateral carotid artery stenosis     hx  . History of ETOH abuse     member AA 22.5 years  . Thyroid disease     hypo  . Lumbar herniated disc     Past Surgical History  Procedure Laterality Date  . Breast lumpectomy  2012  . Breast biopsy  200, 88  . Breast biopsy  1985/2000    2 BENIGN LEFT BREAST BXS    Terrace Arabia RD, LDN

## 2013-09-02 NOTE — Progress Notes (Signed)
Subjective:  Tolerated hip surgery well.  Not SOB.  No chest pain  Objective:  Vital Signs in the last 24 hours: BP 156/54  Pulse 55  Temp(Src) 98.5 F (36.9 C) (Oral)  Resp 18  Ht 5' 2.25" (1.581 m)  Wt 54.432 kg (120 lb)  BMI 21.78 kg/m2  SpO2 97%  Physical Exam: Pleasant WF in NAD Lungs:  Clear  Cardiac:  Regular rhythm, normal S1 and S2, no S3 Abdomen:  Soft, nontender, no masses Extremities:  No edema present  Intake/Output from previous day: 02/01 0701 - 02/02 0700 In: 1700 [I.V.:1450; IV Piggyback:250] Out: 2350 [Urine:2150; Blood:200] Weight Filed Weights   08/31/13 1218 09/01/13 1721  Weight: 54.432 kg (120 lb) 54.432 kg (120 lb)    Lab Results: Basic Metabolic Panel:  Recent Labs  09/01/13 0418 09/02/13 0526  NA 144 142  K 4.0 4.7  CL 108 106  CO2 22 22  GLUCOSE 117* 131*  BUN 14 14  CREATININE 1.01 0.91    CBC:  Recent Labs  08/31/13 1421 09/01/13 0418 09/02/13 0526  WBC 11.7* 11.3* 11.2*  NEUTROABS 10.1*  --   --   HGB 13.2 12.1 10.4*  HCT 39.2 36.8 31.5*  MCV 93.3 93.9 93.8  PLT 165 170 124*   Telemetry: Sinus rhythm  Assessment/Plan:  1. Stable post hip surgery 2. Sinus bradycardia improved  Continue progress. Reduce IV fluids, can go to rehab most any time from cardiac viewpoint.      Kerry Hough  MD [facility]  09/02/2013, 9:57 AM

## 2013-09-02 NOTE — Progress Notes (Signed)
     Subjective:  Patient reports pain as moderate.  No complaints.  Doing well.  Feels better than preop.  Objective:   VITALS:   Filed Vitals:   09/02/13 0100 09/02/13 0626 09/02/13 0800 09/02/13 1010  BP: 140/46 156/54  146/44  Pulse: 54 55  66  Temp: 98 F (36.7 C) 98.5 F (36.9 C)  98.6 F (37 C)  TempSrc:      Resp: 15 16 18 16   Height:      Weight:      SpO2: 93% 97%  96%    Neurologically intact Dorsiflexion/Plantar flexion intact Incision: dressing C/D/I and no drainage   Lab Results  Component Value Date   WBC 11.2* 09/02/2013   HGB 10.4* 09/02/2013   HCT 31.5* 09/02/2013   MCV 93.8 09/02/2013   PLT 124* 09/02/2013     Assessment/Plan: 1 Day Post-Op   Principal Problem:   Hip fracture, intertrochanteric Active Problems:   Hypertension   Hyperlipidemia   GERD (gastroesophageal reflux disease)   Bradycardia   Sinus node dysfunction   Pre-operative cardiovascular examination   Advance diet Up with therapy Discharge to SNF Doing well overall.   Loreta Blouch P 09/02/2013, 11:55 AM   Marchia Bond, MD Cell [phone]

## 2013-09-02 NOTE — Progress Notes (Signed)
[facility]                                                                              Patient Demographics  Tammie [name], is a 80 y.o. female, DOB - May 11, 1934, [id number]  Admit date - 08/31/2013   Admitting Physician [name], MD  Outpatient Primary MD for the patient is [name], [name], MD  LOS - 2   Chief Complaint  Patient presents with  . Fall        Assessment & Plan  Intertrochanteric fracture of right hip secondary to mechanical fall  -Dr. Mardelle Matte, ortho consulted, s/p INTRAMEDULLARY (IM) NAIL FEMORAL -Continue pain control -PT/OT consulted, pending recommendations for SNF vs CIR  Sinus bradycardia  -Cardiology consulted -Echocardiogram: EF 60-65% -Avoid AV Nodal blocking agents  Hypothyroidism  -Continue Synthroid.  -TSH level 0.459  Hypertension  -Continue patient's home medications of amlodipine, benazepril, furosemide.   Hyperlipidemia  -Continue simvastatin   Carotid artery stenosis  -Holding Plavix as well as aspirin for surgery. -Cardiology consulted   Depression anxiety  -Continue patient's fluoxetine.   Osteoporosis  -Continue patient's Fosamax. Will hold her vitamin D3 as well as calcium carbonate.   History of breast cancer  -Continue Arimidex.   Grade 1 Diastolic dysfunction -Not overloaded, compensated, found on echocardiogram -Will continue to monitor and order CHF education  Code Status: Full  Family Communication: None at bedside  Disposition Plan: Admitted, will likely need rehab or SNF.  Time Spent in minutes   30 minutes  Procedures  09/01/13: INTRAMEDULLARY (IM) NAIL FEMORAL  Echocardiogram Study Conclusions - Left ventricle: The cavity size was normal. Wall thickness was normal. Systolic function was normal. The estimated ejection fraction was in the range of 60% to 65%. Wall motion was normal; there were no regional wall motion abnormalities. Doppler parameters are consistent with abnormal left  ventricular relaxation (grade 1 diastolic dysfunction). - Aortic valve: Mildly calcified annulus. Trileaflet; mildly calcified leaflets. Moderate calcification involving the noncoronary cusp. No significant regurgitation. - Mitral valve: Calcified annulus. Trivial regurgitation. - Left atrium: The atrium was mildly dilated. - Right atrium: Central venous pressure: 52mm Hg (est). - Atrial septum: No defect or patent foramen ovale was identified. - Tricuspid valve: Trivial regurgitation. - Pulmonary arteries: Systolic pressure could not be accurately estimated. - Pericardium, extracardiac: A prominent pericardial fat pad was present. Impressions:  - Normal LV  wall thickness with LVEF [id number], grade 1 diastolic dysfunction. Mild left atrial enlargement. MAC with trivial mitral regurgitation. Sclerotic aortic valve. Unable to assess PASP.    Consults   Cardiology Orthopedic Surgery  DVT Prophylaxis  Lovenox   Lab Results  Component Value Date   PLT 124* 09/02/2013    Medications  Scheduled Meds: . amLODipine  10 mg Oral Daily  . anastrozole  1 mg Oral Daily  . benazepril  10 mg Oral Daily  . docusate sodium  100 mg Oral BID  . enoxaparin (LOVENOX) injection  40 mg Subcutaneous Q24H  . FLUoxetine  20 mg Oral Daily  . furosemide  40 mg Oral Daily  . levothyroxine  88 mcg Oral QAC breakfast  . senna  1 tablet Oral BID  Continuous Infusions: . 0.45 % NaCl with KCl 20 mEq / L 75 mL/hr at 09/01/13 2030   PRN Meds:.acetaminophen, acetaminophen, alum & mag hydroxide-simeth, bisacodyl, HYDROcodone-acetaminophen, menthol-cetylpyridinium, metoCLOPramide (REGLAN) injection, metoCLOPramide, morphine injection, ondansetron (ZOFRAN) IV, ondansetron, phenol, polyethylene glycol  Antibiotics    Anti-infectives   Start     Dose/Rate Route Frequency Ordered Stop   09/01/13 2300  ceFAZolin (ANCEF) IVPB 1 g/50 mL premix     1 g 100 mL/hr over 30 Minutes Intravenous Every 6 hours 09/01/13 2034  09/02/13 0533   09/01/13 1457  [MAR Hold]  ceFAZolin (ANCEF) IVPB 2 g/50 mL premix     (On MAR Hold since 09/01/13 1534)   2 g 100 mL/hr over 30 Minutes Intravenous 30 min pre-op 09/01/13 1457 09/01/13 1725        Subjective:   Tammie [name] seen and examined today.  Patient states her hip pain is better today and wonders about physical therapy.    Objective:   Filed Vitals:   09/01/13 2015 09/01/13 2100 09/02/13 0100 09/02/13 0626  BP: 143/45 149/59 140/46 156/54  Pulse: 60 63 54 55  Temp: 98.1 F (36.7 C) 97.6 F (36.4 C) 98 F (36.7 C) 98.5 F (36.9 C)  TempSrc:      Resp: 11 15 15 16   Height:      Weight:      SpO2: 90% 92% 93% 97%    Wt Readings from Last 3 Encounters:  09/01/13 54.432 kg (120 lb)  09/01/13 54.432 kg (120 lb)  07/03/13 55.14 kg (121 lb 9 oz)     Intake/Output Summary (Last 24 hours) at 09/02/13 0846 Last data filed at 09/02/13 0500  Gross per 24 hour  Intake   1700 ml  Output   2350 ml  Net   -650 ml    Exam General: Well developed, well nourished, NAD, appears stated age  HEENT: NCAT, PERRLA, EOMI, Anicteic Sclera, mucous membranes moist.  Neck: Supple, no JVD, no masses  Cardiovascular: S1 S2 auscultated, bradycardic, 2/6SEM  Respiratory: Clear to auscultation bilaterally with equal chest rise  Abdomen: Soft, nontender, nondistended, + bowel sounds  Extremities: warm dry without cyanosis clubbing or edema. Tenderness to palpation of the right hip. Right foot, plantar surface has callus.  Neuro: AAOx3, cranial nerves grossly intact. Skin: Without rashes exudates or nodules  Psych: Normal affect and demeanor with intact judgement and insight   Data Review   Micro Results Recent Results (from the past 240 hour(s))  MRSA PCR SCREENING     Status: None   Collection Time    09/01/13  3:20 PM      Result Value Range Status   MRSA by PCR NEGATIVE  NEGATIVE Final   Comment:            The GeneXpert MRSA Assay (FDA     approved for  NASAL specimens     only), is one component of a     comprehensive MRSA colonization     surveillance program. It is not     intended to diagnose MRSA     infection nor to guide or     monitor treatment for     MRSA infections.    Radiology Reports Dg Hip Complete Right  08/31/2013   CLINICAL DATA:  Right hip pain status post fall  EXAM: RIGHT HIP - COMPLETE 2+ VIEW  COMPARISON:  None.  FINDINGS: There is a nondisplaced right intertrochanteric fracture with a fracture cleft extending into  the proximal femoral diaphysis. There are mild osteoarthritic changes of the right hip. There is no dislocation.  The left hip is unremarkable.  There is degenerative disc disease of the lower lumbar spine.  The SI joints are unremarkable.  There is a coarsely calcified left pelvic mass likely representing a calcified uterine fibroid.  IMPRESSION: Nondisplaced right intertrochanteric fracture with fracture cleft extending into the proximal femoral diaphysis. No dislocation.  Mild osteoarthritis of the right hip.   Electronically Signed   By: Kathreen Devoid   On: 08/31/2013 13:42   Dg Chest Portable 1 View  08/31/2013   CLINICAL DATA:  Preoperative evaluation for hip surgery, history of hypertension, breast cancer, former smoker  EXAM: PORTABLE CHEST - 1 VIEW  COMPARISON:  Portable exam 1432 hr compared to 02/17/2011  FINDINGS: Normal heart size, mediastinal contours and pulmonary vascularity for technique.  Atherosclerotic calcification aorta.  Mild emphysematous changes.  No acute infiltrate, pleural effusion or pneumothorax.  Bones unremarkable.  IMPRESSION: Mild emphysematous changes.  No acute abnormalities.   Electronically Signed   By: Lavonia Dana M.D.   On: 08/31/2013 14:56    CBC  Recent Labs Lab 08/31/13 1421 09/01/13 0418 09/02/13 0526  WBC 11.7* 11.3* 11.2*  HGB 13.2 12.1 10.4*  HCT 39.2 36.8 31.5*  PLT 165 170 124*  MCV 93.3 93.9 93.8  MCH 31.4 30.9 31.0  MCHC 33.7 32.9 33.0  RDW 14.1 14.4  14.4  LYMPHSABS 1.0  --   --   MONOABS 0.6  --   --   EOSABS 0.0  --   --   BASOSABS 0.0  --   --     Chemistries   Recent Labs Lab 08/31/13 1421 09/01/13 0418 09/02/13 0526  NA 140 144 142  K 4.2 4.0 4.7  CL 104 108 106  CO2 22 22 22   GLUCOSE 131* 117* 131*  BUN 19 14 14   CREATININE 1.01 1.01 0.91  CALCIUM 9.1 9.1 8.4   ------------------------------------------------------------------------------------------------------------------ estimated creatinine clearance is 40.8 ml/min (by C-G formula based on Cr of 0.91). ------------------------------------------------------------------------------------------------------------------ No results found for this basename: HGBA1C,  in the last 72 hours ------------------------------------------------------------------------------------------------------------------ No results found for this basename: CHOL, HDL, LDLCALC, TRIG, CHOLHDL, LDLDIRECT,  in the last 72 hours ------------------------------------------------------------------------------------------------------------------  Recent Labs  09/01/13 0418  TSH 0.459   ------------------------------------------------------------------------------------------------------------------ No results found for this basename: VITAMINB12, FOLATE, FERRITIN, TIBC, IRON, RETICCTPCT,  in the last 72 hours  Coagulation profile  Recent Labs Lab 08/31/13 1421  INR 1.10    No results found for this basename: DDIMER,  in the last 72 hours  Cardiac Enzymes No results found for this basename: CK, CKMB, TROPONINI, MYOGLOBIN,  in the last 168 hours ------------------------------------------------------------------------------------------------------------------ No components found with this basename: POCBNP,     Chrisma Hurlock D.O. on 09/02/2013 at 8:46 AM  Between 7am to 7pm - Pager - [phone]  After 7pm go to [email or web address] - password [email or web address]  And look for the night coverage person  covering for me after hours  [facility] Office  [phone]

## 2013-09-02 NOTE — Consult Note (Signed)
Physical Medicine and Rehabilitation Consult  Reason for Consult: Right hip fracture Referring Physician: Dr. Catha Gosselin.   HPI: Tammie [name] is a 80 y.o. female with history of coronary artery disease, carotid artery stenosis, hypertension, breast cancer, who tripped over her cart at the grocery store, fell and had immediate onset of right hip pain with inability to walk. She was admitted for work up on 08/31/13 and found to have minimally displaced right intertrochanteric hip fracture. She was cleared for surgery by cardiology and underwent IM nailing 09/01/13 by Dr. Dion Saucier. Post op WBAT and on lovenox for DVT prophylaxis. Post op anemia noted with Hgb-10.4. PT evaluation done today and CIR recommended     Review of Systems  HENT: Positive for hearing loss.   Eyes: Negative for blurred vision and double vision.  Respiratory: Negative for cough and shortness of breath.   Cardiovascular: Negative for chest pain and palpitations.  Gastrointestinal: Negative for heartburn.  Musculoskeletal: Positive for joint pain and myalgias.       Impaired balance due to leg length discrepancy.   Neurological: Positive for dizziness. Negative for headaches.  Psychiatric/Behavioral: The patient is nervous/anxious.     Past Medical History  Diagnosis Date  . Breast lump     left  . Depression   . Hyperlipidemia   . Hypertension   . GERD (gastroesophageal reflux disease)   . Cancer     breast  . Breast cancer T1aN0 S/P lumpectomy SLN Bx 01/2011 03/25/2011  . Myocardial infarction 1999    mi  . Bilateral carotid artery stenosis     hx  . History of ETOH abuse     member AA 22.5 years  . Thyroid disease     hypo  . Lumbar herniated disc    Past Surgical History  Procedure Laterality Date  . Breast lumpectomy  2012  . Breast biopsy  200, 88  . Breast biopsy  1985/2000    2 BENIGN LEFT BREAST BXS   Family History  Problem Relation Age of Onset  . Breast cancer Paternal Aunt   .  Heart attack Mother   . Hypertension Mother   . Hyperlipidemia Mother   . Heart attack Father   . Hypertension Father   . Hyperlipidemia Father    Social History: Lives alone but has supportive friends who can check in past discharge. She walks daily and does chair yoga/weekly. She  reports that she quit smoking about 8 years ago. Her smoking use included Cigarettes. She smoked 0.00 packs per day. She has never used smokeless tobacco. She reports that she does not drink alcohol or use illicit drugs.  Allergies: No Known Allergies   Medications Prior to Admission  Medication Sig Dispense Refill  . alendronate (FOSAMAX) 70 MG tablet Take 1 tablet (70 mg total) by mouth once a week. Take with a full glass of water on an empty stomach.  12 tablet  6  . amLODipine (NORVASC) 10 MG tablet Take 10 mg by mouth daily.        [name] anastrozole (ARIMIDEX) 1 MG tablet Take 1 tablet (1 mg total) by mouth daily.  90 tablet  12  . aspirin EC 81 MG tablet Take 81 mg by mouth daily.      . benazepril (LOTENSIN) 20 MG tablet Take 10 mg by mouth daily.       . Calcium Carbonate (CALTRATE 600 PO) Take 1 tablet by mouth daily.       [name]  Cholecalciferol (VITAMIN D-3) 1000 UNITS CAPS Take 1 capsule by mouth daily.       . clopidogrel (PLAVIX) 75 MG tablet Take 75 mg by mouth at bedtime.       [name] FLUoxetine (PROZAC) 20 MG tablet Take 20 mg by mouth daily.        . furosemide (LASIX) 40 MG tablet Take 40 mg by mouth daily.       [name] levothyroxine (SYNTHROID, LEVOTHROID) 88 MCG tablet Take 88 mcg by mouth daily.      . Multiple Vitamins-Minerals (CENTRUM SILVER PO) Take by mouth daily.      . simvastatin (ZOCOR) 20 MG tablet Take 20 mg by mouth at bedtime.         Home: Home Living Family/patient expects to be discharged to:: Inpatient rehab Living Arrangements: Alone Available Help at Discharge: Friend(s);Neighbor;Other (Comment) (Will need to tease out if pt will have adequate assist) Type of Home: House Home  Access: Stairs to enter Entrance Stairs-Number of Steps: 2 Entrance Stairs-Rails:  (tbd) Home Layout: One level Home Equipment: None  Functional History: Prior Function Comments: very independent; attends a seniors yoga class Functional Status:  Mobility:     Ambulation/Gait Ambulation Distance (Feet): 3 Feet (pivot steps bed to chair) General Gait Details: Cues for gait sequence, upright posture and technique; hips and trunk  flexed but able to take steps without physical assist; assist mostly for maneuvering RW    ADL:    Cognition: Cognition Overall Cognitive Status: Within Functional Limits for tasks assessed Orientation Level: Oriented to person;Oriented to place Cognition Arousal/Alertness: Awake/alert Behavior During Therapy: [facility] for tasks assessed/performed;Impulsive (somewhat) Overall Cognitive Status: Within Functional Limits for tasks assessed  Blood pressure 146/44, pulse 66, temperature 98.6 F (37 C), temperature source Oral, resp. rate 16, height 5' 2.25" (1.581 m), weight 54.432 kg (120 lb), SpO2 96.00%. Physical Exam  Nursing note and vitals reviewed. Constitutional: She is oriented to person, place, and time. She appears well-developed and well-nourished.  HENT:  Head: Normocephalic and atraumatic.  Eyes: Pupils are equal, round, and reactive to light.  Neck: Normal range of motion. Neck supple.  Cardiovascular: Normal rate and regular rhythm.   Respiratory: Effort normal and breath sounds normal. No respiratory distress. She has no wheezes.  GI: Soft. Bowel sounds are normal. She exhibits no distension. There is no tenderness.  Musculoskeletal:  Edema right thigh with dry dressing. Unable to move RLE due to pain inhibition. Moves BUE/LLE without difficulty.   Neurological: She is alert and oriented to person, place, and time. No cranial nerve deficit. Coordination normal.  UE's 5/5. LLE 4/5. Right ankle 4/5  Psychiatric: She has a normal mood and  affect. Her behavior is normal. Judgment and thought content normal.    Results for orders placed during the hospital encounter of 08/31/13 (from the past 24 hour(s))  MRSA PCR SCREENING     Status: None   Collection Time    09/01/13  3:20 PM      Result Value Range   MRSA by PCR NEGATIVE  NEGATIVE  CBC     Status: Abnormal   Collection Time    09/02/13  5:26 AM      Result Value Range   WBC 11.2 (*) 4.0 - 10.5 K/uL   RBC 3.36 (*) 3.87 - 5.11 MIL/uL   Hemoglobin 10.4 (*) 12.0 - 15.0 g/dL   HCT 31.5 (*) 36.0 - 46.0 %   MCV 93.8  78.0 - 100.0 fL  MCH 31.0  26.0 - 34.0 pg   MCHC 33.0  30.0 - 36.0 g/dL   RDW 14.4  11.5 - 15.5 %   Platelets 124 (*) 150 - 400 K/uL  BASIC METABOLIC PANEL     Status: Abnormal   Collection Time    09/02/13  5:26 AM      Result Value Range   Sodium 142  137 - 147 mEq/L   Potassium 4.7  3.7 - 5.3 mEq/L   Chloride 106  96 - 112 mEq/L   CO2 22  19 - 32 mEq/L   Glucose, Bld 131 (*) 70 - 99 mg/dL   BUN 14  6 - 23 mg/dL   Creatinine, Ser 0.91  0.50 - 1.10 mg/dL   Calcium 8.4  8.4 - 10.5 mg/dL   GFR calc non Af Amer 59 (*) >90 mL/min   GFR calc Af Amer 68 (*) >90 mL/min   Dg Hip Complete Right  08/31/2013   CLINICAL DATA:  Right hip pain status post fall  EXAM: RIGHT HIP - COMPLETE 2+ VIEW  COMPARISON:  None.  FINDINGS: There is a nondisplaced right intertrochanteric fracture with a fracture cleft extending into the proximal femoral diaphysis. There are mild osteoarthritic changes of the right hip. There is no dislocation.  The left hip is unremarkable.  There is degenerative disc disease of the lower lumbar spine.  The SI joints are unremarkable.  There is a coarsely calcified left pelvic mass likely representing a calcified uterine fibroid.  IMPRESSION: Nondisplaced right intertrochanteric fracture with fracture cleft extending into the proximal femoral diaphysis. No dislocation.  Mild osteoarthritis of the right hip.   Electronically Signed   By: Kathreen Devoid   On: 08/31/2013 13:42   Dg Femur Right  09/01/2013   CLINICAL DATA:  Fracture fixation.  EXAM: RIGHT FEMUR - 2 VIEW; DG C-ARM 1-60 MIN  COMPARISON:  Plain films of the right hip 08/31/2013.  FINDINGS: The patient has a new gamma nail with along IM rod and a distal interlocking screw for fixation of an intertrochanteric fracture with subtrochanteric extension. Hardware is intact. No new abnormality.  IMPRESSION: ORIF right intertrochanteric fracture with subtrochanteric extension.   Electronically Signed   By: Inge Rise M.D.   On: 09/01/2013 19:22   Dg Pelvis Portable  09/01/2013   CLINICAL DATA:  Postop right hip  EXAM: PORTABLE PELVIS 1-2 VIEWS  COMPARISON:  Fluoroscopic images performed 630 today  FINDINGS: Single AP image shows intra medullary rod on the right femur with transcervical orthopedic screw. There is fracture through the proximal right femur. Lesser trochanter is mildly displaced. Mild displacement of the base of the greater trochanter also identified postsurgical change including soft tissue edema and a small amount of soft tissue air along the lateral right femur noted.  IMPRESSION: Postoperative changes related to ORIF of proximal femur fracture   Electronically Signed   By: Skipper Cliche M.D.   On: 09/01/2013 20:46   Dg Chest Portable 1 View  08/31/2013   CLINICAL DATA:  Preoperative evaluation for hip surgery, history of hypertension, breast cancer, former smoker  EXAM: PORTABLE CHEST - 1 VIEW  COMPARISON:  Portable exam 1432 hr compared to 02/17/2011  FINDINGS: Normal heart size, mediastinal contours and pulmonary vascularity for technique.  Atherosclerotic calcification aorta.  Mild emphysematous changes.  No acute infiltrate, pleural effusion or pneumothorax.  Bones unremarkable.  IMPRESSION: Mild emphysematous changes.  No acute abnormalities.   Electronically Signed   By: Elta Guadeloupe  Thornton Papas M.D.   On: 08/31/2013 14:56   Dg Hip Portable 1 View Right  09/01/2013   CLINICAL  DATA:  Postoperative right hip  EXAM: PORTABLE RIGHT HIP - 1 VIEW  COMPARISON:  Reason fluoroscopic study  FINDINGS: ORIF proximal right femur fracture with intra medullary rod noted appropriately within the femur. Postoperative change  IMPRESSION: Postoperative change   Electronically Signed   By: Skipper Cliche M.D.   On: 09/01/2013 20:47   Dg C-arm 1-60 Min  09/01/2013   CLINICAL DATA:  Fracture fixation.  EXAM: RIGHT FEMUR - 2 VIEW; DG C-ARM 1-60 MIN  COMPARISON:  Plain films of the right hip 08/31/2013.  FINDINGS: The patient has a new gamma nail with along IM rod and a distal interlocking screw for fixation of an intertrochanteric fracture with subtrochanteric extension. Hardware is intact. No new abnormality.  IMPRESSION: ORIF right intertrochanteric fracture with subtrochanteric extension.   Electronically Signed   By: Inge Rise M.D.   On: 09/01/2013 19:22    Assessment/Plan: Diagnosis: right IT hip fx 1. Does the need for close, 24 hr/day medical supervision in concert with the patient's rehab needs make it unreasonable for this patient to be served in a less intensive setting? Potentially 2. Co-Morbidities requiring supervision/potential complications: htn, GERD, brady 3. Due to bladder management, bowel management, safety, skin/wound care, disease management, medication administration, pain management and patient education, does the patient require 24 hr/day rehab nursing? Potentially 4. Does the patient require coordinated care of a physician, rehab nurse, PT, OT to address physical and functional deficits in the context of the above medical diagnosis(es)? Yes Addressing deficits in the following areas: balance, endurance, locomotion, strength, transferring, bowel/bladder control, bathing, dressing, grooming and toileting 5. Can the patient actively participate in an intensive therapy program of at least 3 hrs of therapy per day at least 5 days per week? Potentially 6. The potential  for patient to make measurable gains while on inpatient rehab is good 7. Anticipated functional outcomes upon discharge from inpatient rehab are supervision to mod I with PT, supervision to mod I with OT, n/a with SLP. 8. Estimated rehab length of stay to reach the above functional goals is: n/a 9. Does the patient have adequate social supports to accommodate these discharge functional goals? No 10. Anticipated D/C setting: Home 11. Anticipated post D/C treatments: [place] therapy 12. Overall Rehab/Functional Prognosis: excellent  RECOMMENDATIONS: This patient's condition is appropriate for continued rehabilitative care in the following setting: SNF Patient has agreed to participate in recommended program. Yes Note that insurance prior authorization may be required for reimbursement for recommended care.  Comment: Pt has NO social supports to provide assistance once home. Most appropriate for a more extended stay at a SNF.  Meredith Staggers, MD, [place] [facility]     09/02/2013

## 2013-09-02 NOTE — Evaluation (Signed)
Physical Therapy Evaluation Patient Details Name: Tammie [name] MRN: [id number] DOB: [birth year] Today's Date: 09/02/2013 Time: 0263-7858 PT Time Calculation (min): 25 min  PT Assessment / Plan / Recommendation History of Present Illness   INTRAMEDULLARY (IM) NAIL FEMORAL  Clinical Impression  Patient is s/p above surgery resulting in functional limitations due to the deficits listed below (see PT Problem List).  Patient will benefit from skilled PT to increase their independence and safety with mobility to allow discharge to the venue listed below.    Pt was very active  And independent prior to fall; She reports she has lots of help from her close-knit group of friends; It is likely that with intensive rehab she will be able to dc home at near independent functional level     PT Assessment  Patient needs continued PT services    Follow Up Recommendations  CIR    Does the patient have the potential to tolerate intense rehabilitation      Barriers to Discharge Decreased caregiver support P reports she has lots of help from neighbors; Will need more info    Equipment Recommendations  Rolling walker with 5" wheels;3in1 (PT)    Recommendations for Other Services Rehab consult;OT consult   Frequency Min 6X/week    Precautions / Restrictions Precautions Precautions: Fall Restrictions Weight Bearing Restrictions: Yes RLE Weight Bearing: Weight bearing as tolerated   Pertinent Vitals/Pain 10+/10 with R hip movement patient repositioned for comfort       Mobility  Bed Mobility Overal bed mobility: Needs Assistance;+ 2 for safety/equipment Bed Mobility: Supine to Sit Supine to sit: Max assist;+2 for safety/equipment General bed mobility comments: Step-by-step cues for technique; Pt very anxious with anticipation of pain; Required physical assist for most aspects of stitting up including easing feet off of bed and elevating trunk to sitting Transfers Overall transfer level:  Needs assistance Equipment used: Rolling walker (2 wheeled) Transfers: Sit to/from Stand Sit to Stand: Mod assist;+2 safety/equipment (Heavy mod assist) General transfer comment: Cues for technique; Pt showed good initiation and lift off; difficulty acheiving fully upright posture; hips flexed, and decr WBing trhough RLE Ambulation/Gait Ambulation/Gait assistance: +2 safety/equipment;Mod assist Ambulation Distance (Feet): 3 Feet (pivot steps bed to chair) Assistive device: Rolling walker (2 wheeled) Gait Pattern/deviations: Step-to pattern;Antalgic General Gait Details: Cues for gait sequence, upright posture and technique; hips and trunk  flexed but able to take steps without physical assist; assist mostly for maneuvering RW    Exercises General Exercises - Lower Extremity Ankle Circles/Pumps: AROM;Both;10 reps Quad Sets: AROM;Right;5 reps Heel Slides: AAROM;Right;5 reps Hip ABduction/ADduction: AAROM;Right;5 reps   PT Diagnosis: Difficulty walking;Acute pain  PT Problem List: Decreased strength;Decreased range of motion;Decreased activity tolerance;Decreased balance;Decreased mobility;Decreased knowledge of use of DME;Pain;Decreased knowledge of precautions PT Treatment Interventions: DME instruction;Gait training;Stair training;Functional mobility training;Therapeutic activities;Therapeutic exercise;Balance training;Patient/family education     PT Goals(Current goals can be found in the care plan section) Acute Rehab PT Goals Patient Stated Goal: REALLY wants to rehab up to going home PT Goal Formulation: With patient Time For Goal Achievement: 09/09/13 Potential to Achieve Goals: Good  Visit Information  Last PT Received On: 09/02/13 Assistance Needed: +2 (Helpful for prog amb) History of Present Illness:  INTRAMEDULLARY (IM) NAIL FEMORAL       Prior Keith expects to be discharged to:: Inpatient rehab Living Arrangements: Alone Available  Help at Discharge: Friend(s);Neighbor;Other (Comment) (Will need to tease out if pt will have adequate assist) Type of Home:  House Home Access: Stairs to enter [employer] of Steps: 2 Entrance Stairs-Rails:  (tbd) Home Layout: One level Home Equipment: None Prior Function Level of Independence: Independent Comments: very independent; attends a seniors yoga class Communication Communication: No difficulties;HOH    Cognition  Cognition Arousal/Alertness: Awake/alert Behavior During Therapy: WFL for tasks assessed/performed;Impulsive (somewhat) Overall Cognitive Status: Within Functional Limits for tasks assessed    Extremity/Trunk Assessment Upper Extremity Assessment Upper Extremity Assessment: Overall WFL for tasks assessed Lower Extremity Assessment Lower Extremity Assessment: RLE deficits/detail RLE Deficits / Details: Decr AROM and strength limited by pain postop; good quad set; significant muscle guard with difficulty relaxing for heel slides RLE: Unable to fully assess due to pain   Balance Balance Overall balance assessment: Needs assistance Sitting balance-Leahy Scale: Fair Postural control: Left lateral lean (antalgic) Standing balance support: Bilateral upper extremity supported Standing balance-Leahy Scale: Fair Standing balance comment: Heavy dependence on UE support on RW with decr WBing RLE due to pain  End of Session PT - End of Session Equipment Utilized During Treatment: Gait belt Activity Tolerance: Patient limited by pain Patient left: in chair;with call bell/phone within reach Nurse Communication: Mobility status  GP     Roney Marion [facility] [place], [place]  09/02/2013, 10:44 AM

## 2013-09-03 DIAGNOSIS — S72143A Displaced intertrochanteric fracture of unspecified femur, initial encounter for closed fracture: Secondary | ICD-10-CM

## 2013-09-03 LAB — URINALYSIS, ROUTINE W REFLEX MICROSCOPIC
BILIRUBIN URINE: NEGATIVE
GLUCOSE, UA: NEGATIVE mg/dL
Hgb urine dipstick: NEGATIVE
KETONES UR: NEGATIVE mg/dL
Leukocytes, UA: NEGATIVE
Nitrite: NEGATIVE
PROTEIN: NEGATIVE mg/dL
Specific Gravity, Urine: 1.018 (ref 1.005–1.030)
UROBILINOGEN UA: 1 mg/dL (ref 0.0–1.0)
pH: 5.5 (ref 5.0–8.0)

## 2013-09-03 LAB — BASIC METABOLIC PANEL
BUN: 17 mg/dL (ref 6–23)
CHLORIDE: 105 meq/L (ref 96–112)
CO2: 22 mEq/L (ref 19–32)
CREATININE: 0.9 mg/dL (ref 0.50–1.10)
Calcium: 8.3 mg/dL — ABNORMAL LOW (ref 8.4–10.5)
GFR calc non Af Amer: 60 mL/min — ABNORMAL LOW (ref >90)
GFR, EST AFRICAN AMERICAN: 69 mL/min — AB (ref >90)
GLUCOSE: 98 mg/dL (ref 70–99)
Potassium: 3.9 mEq/L (ref 3.7–5.3)
Sodium: 140 mEq/L (ref 137–147)

## 2013-09-03 LAB — CBC
HEMATOCRIT: 29.5 % — AB (ref 36.0–46.0)
HEMOGLOBIN: 9.6 g/dL — AB (ref 12.0–15.0)
MCH: 30.4 pg (ref 26.0–34.0)
MCHC: 32.5 g/dL (ref 30.0–36.0)
MCV: 93.4 fL (ref 78.0–100.0)
Platelets: 122 10*3/uL — ABNORMAL LOW (ref 150–400)
RBC: 3.16 MIL/uL — ABNORMAL LOW (ref 3.87–5.11)
RDW: 14.5 % (ref 11.5–15.5)
WBC: 9.5 10*3/uL (ref 4.0–10.5)

## 2013-09-03 MED ORDER — ACETAMINOPHEN 325 MG PO TABS: 650.0000 mg | ORAL_TABLET | Freq: Four times a day (QID) | ORAL | Status: DC | PRN

## 2013-09-03 MED ORDER — MENTHOL 3 MG MT LOZG: 1.0000 | LOZENGE | OROMUCOSAL | Status: DC | PRN

## 2013-09-03 MED ORDER — ALUM & MAG HYDROXIDE-SIMETH 200-200-20 MG/5ML PO SUSP: 30.0000 mL | ORAL | Status: DC | PRN

## 2013-09-03 MED ORDER — DSS 100 MG PO CAPS: 100.0000 mg | ORAL_CAPSULE | Freq: Two times a day (BID) | ORAL | Status: DC

## 2013-09-03 MED ORDER — BOOST / RESOURCE BREEZE PO LIQD: 1.0000 | Freq: Two times a day (BID) | ORAL | Status: DC

## 2013-09-03 MED ORDER — BISACODYL 10 MG RE SUPP: 10.0000 mg | Freq: Every day | RECTAL | Status: DC | PRN

## 2013-09-03 NOTE — Progress Notes (Signed)
     Subjective:  Patient reports pain as mild.  No complaints.  Objective:   VITALS:   Filed Vitals:   09/02/13 2157 09/03/13 0603 09/03/13 0900 09/03/13 1300  BP: 124/51 157/47  139/76  Pulse: 68 60  71  Temp: 98.1 F (36.7 C) 98.4 F (36.9 C)  98.2 F (36.8 C)  TempSrc: Oral Oral  Oral  Resp: 19 18  18   Height:   5\' 2"  (1.575 m)   Weight:   58.9 kg (129 lb 13.6 oz)   SpO2: 92% 96%  90%    Neurologically intact Dorsiflexion/Plantar flexion intact Incision: dressing C/D/I and no drainage   Lab Results  Component Value Date   WBC 9.5 09/03/2013   HGB 9.6* 09/03/2013   HCT 29.5* 09/03/2013   MCV 93.4 09/03/2013   PLT 122* 09/03/2013     Assessment/Plan: 2 Days Post-Op   Principal Problem:   Hip fracture, intertrochanteric Active Problems:   Hypertension   Hyperlipidemia   GERD (gastroesophageal reflux disease)   Bradycardia   Sinus node dysfunction   Pre-operative cardiovascular examination   Advance diet Up with therapy Discharge to SNF Doing well from my standpoint.  Mild abla, observe. OK for dc from ortho standpoint.   Emerson Schreifels P 09/03/2013, 2:12 PM   Marchia Bond, MD Cell [phone]

## 2013-09-03 NOTE — Progress Notes (Signed)
OT Cancellation Note  Patient Details Name: Tammie [name] MRN: [id number] DOB: Sep 25, 1933   Cancelled Treatment:    Reason Eval/Treat Not Completed: Other (comment). Inpatient rehab has recommended SNF, will defer OT eval to that facility. If OT eval is needed please call [phone] or page [phone]  Almon Register [id number] 09/03/2013, 2:41 PM

## 2013-09-03 NOTE — Discharge Summary (Signed)
Physician Discharge Summary  Tammie [name] [id number] DOB: 1934/02/29 DOA: 08/31/2013  PCP: Antony Blackbird, MD  Admit date: 08/31/2013 Discharge date: 09/03/2013  Time spent: 40 minutes  Recommendations for Outpatient Follow-up:  Patient will be discharged to a nursing home. She should follow up with  Dr. Mardelle Matte at the specified time as well as her primary care physician within one week of discharge. Patient to continue taking her medications as prescribed. Patient should have her CBC checked within one week of discharge. She should continue physical therapy as well as occupational therapy as recommended by the nursing home facility.   Discharge Diagnoses:  Principal Problem:   Hip fracture, intertrochanteric Active Problems:   Hypertension   Hyperlipidemia   GERD (gastroesophageal reflux disease)   Bradycardia   Sinus node dysfunction   Pre-operative cardiovascular examination   Discharge Condition: Stable  Diet recommendation: Heart healthy  Filed Weights   08/31/13 1218 09/01/13 1721 09/03/13 0900  Weight: 54.432 kg (120 lb) 54.432 kg (120 lb) 58.9 kg (129 lb 13.6 oz)    History of present illness:  Tammie [name] is a 80 y.o. female  With a history of coronary artery disease, carotid artery stenosis, hypertension, breast cancer, that presents emergency department after falling this afternoon. Patient was at the grocery store, she tripped over her cart. Patient was assisted by bystanders. EMS was called and patient was brought into the emergency department was unable to bear any weight on her right hip. He states she is normally very active and does yoga and exercises daily. Patient also states that she's had a history of her right lower extremity fracture when she was young. At this time patient is experiencing an immense amount of pain in the right hip. She denies any shortness of breath, chest pain, palpitations. She does also state that she sees Dr. Oneida Alar for her carotid  artery stenosis for which she takes Plavix and aspirin.    Hospital Course:  Intertrochanteric fracture of right hip secondary to mechanical fall  -Dr. Mardelle Matte, ortho consulted, s/p INTRAMEDULLARY (IM) NAIL FEMORAL  -Continue pain control  -PT/OT consulted, pending recommendations for SNF  -Patient will need follow up with Dr. Mardelle Matte is an outpatient.  Sinus bradycardia  -Improved  -Cardiology consulted  -Echocardiogram: EF 60-65%  -Avoid AV Nodal blocking agents   Hypothyroidism  -Continue Synthroid.  -TSH level 0.459   Hypertension  -Continue patient's home medications of amlodipine, benazepril, furosemide.   Hyperlipidemia  -Continue simvastatin   Carotid artery stenosis  -Holding Plavix as well as aspirin for surgery.  -Will resume Plavix and aspirin. -Patient should have outpatient monitoring for this.  Depression anxiety  -Continue patient's fluoxetine.   Osteoporosis  -Continue patient's Fosamax.  -Will resume her vitamin D as well as calcium carbonate.  History of breast cancer  -Continue Arimidex.   Grade 1 Diastolic dysfunction  -Not overloaded, compensated, found on echocardiogram    Procedures: 09/01/13: INTRAMEDULLARY (IM) NAIL FEMORAL  09/01/13: INTRAMEDULLARY (IM) NAIL FEMORAL  Echocardiogram  Study Conclusions - Left ventricle: The cavity size was normal. Wall thickness was normal. Systolic function was normal. The estimated ejection fraction was in the range of 60% to 65%. Wall motion was normal; there were no regional wall motion abnormalities. Doppler parameters are consistent with abnormal left ventricular relaxation (grade 1 diastolic dysfunction). - Aortic valve: Mildly calcified annulus. Trileaflet; mildly calcified leaflets. Moderate calcification involving the noncoronary cusp. No significant regurgitation. - Mitral valve: Calcified annulus. Trivial regurgitation. - Left atrium: The  atrium was mildly dilated. - Right atrium: Central venous  pressure: 67mm Hg (est). - Atrial septum: No defect or patent foramen ovale was identified. - Tricuspid valve: Trivial regurgitation. - Pulmonary arteries: Systolic pressure could not be accurately estimated. - Pericardium, extracardiac: A prominent pericardial fat pad was present. Impressions:  - Normal LV wall thickness with LVEF [id number], grade 1 diastolic dysfunction. Mild left atrial enlargement. MAC with trivial mitral regurgitation. Sclerotic aortic valve. Unable to assess PASP.   Consultations: Cardiology  Orthopedic Surgery   Discharge Exam: Filed Vitals:   09/03/13 0603  BP: 157/47  Pulse: 60  Temp: 98.4 F (36.9 C)  Resp: 18   Exam  General: Well developed, well nourished, NAD, appears stated age  HEENT: NCAT, PERRLA, EOMI, Anicteic Sclera, mucous membranes moist.  Neck: Supple, no JVD, no masses  Cardiovascular: S1 S2 auscultated, bradycardic, 2/6SEM  Respiratory: Clear to auscultation bilaterally with equal chest rise  Abdomen: Soft, nontender, nondistended, + bowel sounds  Extremities: warm dry without cyanosis clubbing or edema.  Neuro: AAOx3, cranial nerves grossly intact.  Skin: Without rashes exudates or nodules  Psych: Normal affect and demeanor with intact judgement and insight   Discharge Instructions  Discharge Orders   Future Appointments Provider Department Dept Phone   09/05/2013 1:00 PM [facility] [place] [id number]   09/05/2013 2:00 PM Sharmon Leyden Nickel, NP [facility] [phone]   01/13/2014 10:30 AM [facility] [phone]   01/13/2014 11:00 AM Deatra Robinson, MD [facility] [phone]   Future Orders Complete By Expires   Diet - low sodium heart healthy  As directed    Discharge instructions  As directed    Comments:     Patient will be discharged to a nursing home. She should follow up with  Dr.  Mardelle Matte at the specified time as well as her primary care physician within one week of discharge. Patient to continue taking her medications as prescribed. Patient should have her CBC checked within one week of discharge. She should continue physical therapy as well as occupational therapy as recommended by the nursing home facility.   Increase activity slowly  As directed    Weight bearing as tolerated  As directed    Questions:     Laterality:     Extremity:         Medication List         acetaminophen 325 MG tablet  Commonly known as:  TYLENOL  Take 2 tablets (650 mg total) by mouth every 6 (six) hours as needed for mild pain (or Fever >/= 101).     alendronate 70 MG tablet  Commonly known as:  FOSAMAX  Take 1 tablet (70 mg total) by mouth once a week. Take with a full glass of water on an empty stomach.     alum & mag hydroxide-simeth 200-200-20 MG/5ML suspension  Commonly known as:  MAALOX/MYLANTA  Take 30 mLs by mouth every 4 (four) hours as needed for indigestion.     amLODipine 10 MG tablet  Commonly known as:  NORVASC  Take 10 mg by mouth daily.     anastrozole 1 MG tablet  Commonly known as:  ARIMIDEX  Take 1 tablet (1 mg total) by mouth daily.     aspirin EC 81 MG tablet  Take 81 mg by mouth daily.     benazepril 20 MG tablet  Commonly  known as:  LOTENSIN  Take 10 mg by mouth daily.     bisacodyl 10 MG suppository  Commonly known as:  DULCOLAX  Place 1 suppository (10 mg total) rectally daily as needed for moderate constipation.     CALTRATE 600 PO  Take 1 tablet by mouth daily.     CENTRUM SILVER PO  Take by mouth daily.     clopidogrel 75 MG tablet  Commonly known as:  PLAVIX  Take 75 mg by mouth at bedtime.     DSS 100 MG Caps  Take 100 mg by mouth 2 (two) times daily.     enoxaparin 40 MG/0.4ML injection  Commonly known as:  LOVENOX  Inject 0.4 mLs (40 mg total) into the skin daily.     feeding supplement (RESOURCE BREEZE) Liqd  Take 1  Container by mouth 2 (two) times daily between meals.     FLUoxetine 20 MG tablet  Commonly known as:  PROZAC  Take 20 mg by mouth daily.     furosemide 40 MG tablet  Commonly known as:  LASIX  Take 40 mg by mouth daily.     HYDROcodone-acetaminophen 5-325 MG per tablet  Commonly known as:  NORCO  Take 1-2 tablets by mouth every 6 (six) hours as needed. MAXIMUM TOTAL ACETAMINOPHEN DOSE IS 4000 MG PER DAY     levothyroxine 88 MCG tablet  Commonly known as:  SYNTHROID, LEVOTHROID  Take 88 mcg by mouth daily.     menthol-cetylpyridinium 3 MG lozenge  Commonly known as:  CEPACOL  Take 1 lozenge (3 mg total) by mouth as needed for sore throat (sore throat).     sennosides-docusate sodium 8.6-50 MG tablet  Commonly known as:  SENOKOT-S  Take 2 tablets by mouth daily.     simvastatin 20 MG tablet  Commonly known as:  ZOCOR  Take 20 mg by mouth at bedtime.     Vitamin D-3 1000 UNITS Caps  Take 1 capsule by mouth daily.       No Known Allergies     Follow-up Information   Follow up with [name], MD. Schedule an appointment as soon as possible for a visit in 2 weeks.   Specialty:  Orthopedic Surgery   Contact information:   [address] [place] [address] [phone]       Follow up with [name], [name], MD. Schedule an appointment as soon as possible for a visit in 1 week.   Specialty:  Family Medicine   Contact information:   [address] [place] [address] [phone]        The results of significant diagnostics from this hospitalization (including imaging, microbiology, ancillary and laboratory) are listed below for reference.    Significant Diagnostic Studies: Dg Hip Complete Right  08/31/2013   CLINICAL DATA:  Right hip pain status post fall  EXAM: RIGHT HIP - COMPLETE 2+ VIEW  COMPARISON:  None.  FINDINGS: There is a nondisplaced right intertrochanteric fracture with a fracture cleft extending into the proximal  femoral diaphysis. There are mild osteoarthritic changes of the right hip. There is no dislocation.  The left hip is unremarkable.  There is degenerative disc disease of the lower lumbar spine.  The SI joints are unremarkable.  There is a coarsely calcified left pelvic mass likely representing a calcified uterine fibroid.  IMPRESSION: Nondisplaced right intertrochanteric fracture with fracture cleft extending into the proximal femoral diaphysis. No dislocation.  Mild osteoarthritis of  the right hip.   Electronically Signed   By: Kathreen Devoid   On: 08/31/2013 13:42   Dg Femur Right  09/01/2013   CLINICAL DATA:  Fracture fixation.  EXAM: RIGHT FEMUR - 2 VIEW; DG C-ARM 1-60 MIN  COMPARISON:  Plain films of the right hip 08/31/2013.  FINDINGS: The patient has a new gamma nail with along IM rod and a distal interlocking screw for fixation of an intertrochanteric fracture with subtrochanteric extension. Hardware is intact. No new abnormality.  IMPRESSION: ORIF right intertrochanteric fracture with subtrochanteric extension.   Electronically Signed   By: Inge Rise M.D.   On: 09/01/2013 19:22   Dg Pelvis Portable  09/01/2013   CLINICAL DATA:  Postop right hip  EXAM: PORTABLE PELVIS 1-2 VIEWS  COMPARISON:  Fluoroscopic images performed 630 today  FINDINGS: Single AP image shows intra medullary rod on the right femur with transcervical orthopedic screw. There is fracture through the proximal right femur. Lesser trochanter is mildly displaced. Mild displacement of the base of the greater trochanter also identified postsurgical change including soft tissue edema and a small amount of soft tissue air along the lateral right femur noted.  IMPRESSION: Postoperative changes related to ORIF of proximal femur fracture   Electronically Signed   By: Skipper Cliche M.D.   On: 09/01/2013 20:46   Dg Chest Portable 1 View  08/31/2013   CLINICAL DATA:  Preoperative evaluation for hip surgery, history of hypertension, breast  cancer, former smoker  EXAM: PORTABLE CHEST - 1 VIEW  COMPARISON:  Portable exam 1432 hr compared to 02/17/2011  FINDINGS: Normal heart size, mediastinal contours and pulmonary vascularity for technique.  Atherosclerotic calcification aorta.  Mild emphysematous changes.  No acute infiltrate, pleural effusion or pneumothorax.  Bones unremarkable.  IMPRESSION: Mild emphysematous changes.  No acute abnormalities.   Electronically Signed   By: Lavonia Dana M.D.   On: 08/31/2013 14:56   Dg Hip Portable 1 View Right  09/01/2013   CLINICAL DATA:  Postoperative right hip  EXAM: PORTABLE RIGHT HIP - 1 VIEW  COMPARISON:  Reason fluoroscopic study  FINDINGS: ORIF proximal right femur fracture with intra medullary rod noted appropriately within the femur. Postoperative change  IMPRESSION: Postoperative change   Electronically Signed   By: Skipper Cliche M.D.   On: 09/01/2013 20:47   Dg C-arm 1-60 Min  09/01/2013   CLINICAL DATA:  Fracture fixation.  EXAM: RIGHT FEMUR - 2 VIEW; DG C-ARM 1-60 MIN  COMPARISON:  Plain films of the right hip 08/31/2013.  FINDINGS: The patient has a new gamma nail with along IM rod and a distal interlocking screw for fixation of an intertrochanteric fracture with subtrochanteric extension. Hardware is intact. No new abnormality.  IMPRESSION: ORIF right intertrochanteric fracture with subtrochanteric extension.   Electronically Signed   By: Inge Rise M.D.   On: 09/01/2013 19:22    Microbiology: Recent Results (from the past 240 hour(s))  MRSA PCR SCREENING     Status: None   Collection Time    09/01/13  3:20 PM      Result Value Range Status   MRSA by PCR NEGATIVE  NEGATIVE Final   Comment:            The GeneXpert MRSA Assay (FDA     approved for NASAL specimens     only), is one component of a     comprehensive MRSA colonization     surveillance program. It is not     intended  to diagnose MRSA     infection nor to guide or     monitor treatment for     MRSA infections.       Labs: Basic Metabolic Panel:  Recent Labs Lab 08/31/13 1421 09/01/13 0418 09/02/13 0526 09/03/13 0600  NA 140 144 142 140  K 4.2 4.0 4.7 3.9  CL 104 108 106 105  CO2 22 22 22 22   GLUCOSE 131* 117* 131* 98  BUN 19 14 14 17   CREATININE 1.01 1.01 0.91 0.90  CALCIUM 9.1 9.1 8.4 8.3*   Liver Function Tests: No results found for this basename: AST, ALT, ALKPHOS, BILITOT, PROT, ALBUMIN,  in the last 168 hours No results found for this basename: LIPASE, AMYLASE,  in the last 168 hours No results found for this basename: AMMONIA,  in the last 168 hours CBC:  Recent Labs Lab 08/31/13 1421 09/01/13 0418 09/02/13 0526 09/03/13 0600  WBC 11.7* 11.3* 11.2* 9.5  NEUTROABS 10.1*  --   --   --   HGB 13.2 12.1 10.4* 9.6*  HCT 39.2 36.8 31.5* 29.5*  MCV 93.3 93.9 93.8 93.4  PLT 165 170 124* 122*   Cardiac Enzymes: No results found for this basename: CKTOTAL, CKMB, CKMBINDEX, TROPONINI,  in the last 168 hours BNP: BNP (last 3 results) No results found for this basename: PROBNP,  in the last 8760 hours CBG: No results found for this basename: GLUCAP,  in the last 168 hours     Signed:  Cristal Ford  [facility] 09/03/2013, 10:05 AM

## 2013-09-03 NOTE — Progress Notes (Signed)
Rehab admissions - Evaluated for possible admission.  Please see rehab consult done by Dr. Naaman Plummer recommending SNF.  Agree patient likely will need SNF.  Call me for questions.  #[phone]

## 2013-09-04 ENCOUNTER — Encounter: Payer: Self-pay | Admitting: Family

## 2013-09-04 LAB — CBC
HEMATOCRIT: 29.3 % — AB (ref 36.0–46.0)
Hemoglobin: 9.6 g/dL — ABNORMAL LOW (ref 12.0–15.0)
MCH: 30.7 pg (ref 26.0–34.0)
MCHC: 32.8 g/dL (ref 30.0–36.0)
MCV: 93.6 fL (ref 78.0–100.0)
Platelets: 127 10*3/uL — ABNORMAL LOW (ref 150–400)
RBC: 3.13 MIL/uL — ABNORMAL LOW (ref 3.87–5.11)
RDW: 14.3 % (ref 11.5–15.5)
WBC: 8.8 10*3/uL (ref 4.0–10.5)

## 2013-09-04 NOTE — Evaluation (Signed)
Occupational Therapy Evaluation Patient Details Name: Tammie [name] MRN: [id number] DOB: [birth year] Today's Date: 09/04/2013 Time: 4431-5400 OT Time Calculation (min): 19 min  OT Assessment / Plan / Recommendation History of present illness  INTRAMEDULLARY (IM) NAIL FEMORAL   Clinical Impression   Pt presents with below problem list. Education provided. Pt planning to d/c to SNF today. All further OT needs can be met in next venue.     OT Assessment  All further OT needs can be met in the next venue of care    Follow Up Recommendations  SNF    Barriers to Discharge      Equipment Recommendations  Other (comment) (defer to next venue)    Recommendations for Other Services    Frequency       Precautions / Restrictions Precautions Precautions: Fall Restrictions Weight Bearing Restrictions: Yes RLE Weight Bearing: Weight bearing as tolerated   Pertinent Vitals/Pain Pain in RLE during session, but not rated. Repositioned.     ADL  Eating/Feeding: Independent Where Assessed - Eating/Feeding: Chair Grooming: Set up Where Assessed - Grooming: Supported sitting Upper Body Bathing: Set up Where Assessed - Upper Body Bathing: Supported sitting Lower Body Bathing: Maximal assistance (without AE) Where Assessed - Lower Body Bathing: Supported sit to stand Upper Body Dressing: Set up Where Assessed - Upper Body Dressing: Supported sitting Lower Body Dressing: Moderate assistance Where Assessed - Lower Body Dressing: Supported sit to Lobbyist: Moderate assistance Toilet Transfer Method: Sit to stand Toilet Transfer Equipment: Other (comment) (from bed) Toileting - Clothing Manipulation and Hygiene: Moderate assistance Where Assessed - Toileting Clothing Manipulation and Hygiene: Sit to stand from 3-in-1 or toilet Tub/Shower Transfer Method: Not assessed Equipment Used: Gait belt;Rolling walker;Reacher;Long-handled sponge;Long-handled shoe horn;Sock  aid Transfers/Ambulation Related to ADLs: +2 Total A/Min-Mod A for safety with ambulation. Mod A for sit <> stand transfers. ADL Comments: Educated on AE for LB ADLs as well as dressing technique. Recommending sitting for bathing and dressing.  Practiced with sockaid and reacher for socks.    OT Diagnosis: Acute pain  OT Problem List: Decreased strength;Decreased range of motion;Decreased activity tolerance;Impaired balance (sitting and/or standing);Decreased knowledge of use of DME or AE;Decreased knowledge of precautions;Pain OT Treatment Interventions:     OT Goals(Current goals can be found in the care plan section)    Visit Information  Last OT Received On: 09/04/13 Assistance Needed: +2 (helpful for progressive ambulation) History of Present Illness:  INTRAMEDULLARY (IM) NAIL FEMORAL       Prior [place] expects to be discharged to:: Skilled nursing facility Living Arrangements: Alone Available Help at Discharge: Friend(s);Neighbor Type of Home: House Home Access: Stairs to enter Technical brewer of Steps: 2 Home Layout: One level Home Equipment: None Prior Function Level of Independence: Independent Comments: very independent; attends a seniors yoga class Communication Communication: No difficulties;HOH         Vision/Perception Vision - History Baseline Vision:  (wears glasses most of the time)   Cognition  Cognition Arousal/Alertness: Awake/alert Behavior During Therapy: WFL for tasks assessed/performed Overall Cognitive Status: Within Functional Limits for tasks assessed    Extremity/Trunk Assessment Upper Extremity Assessment Upper Extremity Assessment: Overall WFL for tasks assessed Lower Extremity Assessment Lower Extremity Assessment: Defer to PT evaluation     Mobility Bed Mobility Overal bed mobility: Needs Assistance Bed Mobility: Supine to Sit Supine to sit: Mod assist General bed mobility comments:  Cues for technique. Explained technique of hooking foot  around right ankle and pt did use this some and also discussed hooking sheet around RLE to help move it. Assisted with RLE as well as to help scoot hips out. Transfers Overall transfer level: Needs assistance Equipment used: Rolling walker (2 wheeled) Transfers: Sit to/from Stand Sit to Stand: Mod assist General transfer comment: cues for hand placement.     Exercise     Balance     End of Session OT - End of Session Equipment Utilized During Treatment: Gait belt;Rolling walker (O2 placed back on at end of session) Activity Tolerance: Patient tolerated treatment well Patient left: in chair;with call bell/phone within reach Nurse Communication: Mobility status  GO     Benito Mccreedy OTR/L [phone] 09/04/2013, 11:19 AM

## 2013-09-04 NOTE — Care Management Note (Signed)
CARE MANAGEMENT NOTE 09/04/2013  Patient:  Tammie [name], Tammie [name]   Account Number:  [id number]  Date Initiated:  09/02/2013  Documentation initiated by:  Ricki Miller  Subjective/Objective Assessment:   80 yr old female s/p right hip fracture with  IM nailing.     Action/Plan:   CM awaiting CIR reccommendation. Will follow  Patient will need shortterm rehab at [facility], wants [facility]. Social worker is aware.   Anticipated DC Date:  09/04/2013   Anticipated DC Plan:  SKILLED NURSING FACILITY  In-house referral  Clinical Social Worker      DC Planning Services  CM consult      Choice offered to / List presented to:             Status of service:  Completed, signed off Medicare Important Message given?   (If response is "NO", the following Medicare IM given date fields will be blank) Date Medicare IM given:   Date Additional Medicare IM given:    Discharge Disposition:  [place]

## 2013-09-04 NOTE — Progress Notes (Signed)
Patient discharged to [facility], SNF. Report called to Shauna Hugh, RN at [facility]. Patients IV was removed and patient left unit in a stable condition via EMS.

## 2013-09-04 NOTE — Progress Notes (Signed)
     Subjective:  Patient reports pain as mild and states she is just feeling depressed.  Objective:   VITALS:   Filed Vitals:   09/03/13 1300 09/03/13 2033 09/04/13 0445 09/04/13 0715  BP: 139/76 128/36 157/52   Pulse: 71 60 59   Temp: 98.2 F (36.8 C) 98.5 F (36.9 C) 98 F (36.7 C)   TempSrc: Oral Oral    Resp: 18 20 18    Height:      Weight:    59.8 kg (131 lb 13.4 oz)  SpO2: 90% 89% 98%     Neurologically intact ABD soft Sensation intact distally Intact pulses distally Dorsiflexion/Plantar flexion intact Dressings clean, dry, intact with no drainage  Lab Results  Component Value Date   WBC 8.8 09/04/2013   HGB 9.6* 09/04/2013   HCT 29.3* 09/04/2013   MCV 93.6 09/04/2013   PLT 127* 09/04/2013     Assessment/Plan: 3 Days Post-Op   Principal Problem:   Hip fracture, intertrochanteric Active Problems:   Hypertension   Hyperlipidemia   GERD (gastroesophageal reflux disease)   Bradycardia   Sinus node dysfunction   Pre-operative cardiovascular examination   Advance diet Up with therapy Clear for DC from ortho standpoint  Discharge per medical service Weight bearing as tolerated Clean dressings as needed   Remonia Richter 09/04/2013, 8:50 AM   Marchia Bond, MD Cell [phone]

## 2013-09-05 ENCOUNTER — Ambulatory Visit: Payer: Medicare Other | Admitting: Family

## 2013-09-05 ENCOUNTER — Other Ambulatory Visit (HOSPITAL_COMMUNITY): Payer: Self-pay

## 2013-09-06 ENCOUNTER — Non-Acute Institutional Stay (SKILLED_NURSING_FACILITY): Payer: Medicare Other | Admitting: Adult Health

## 2013-09-06 DIAGNOSIS — S72143A Displaced intertrochanteric fracture of unspecified femur, initial encounter for closed fracture: Secondary | ICD-10-CM

## 2013-09-06 DIAGNOSIS — E785 Hyperlipidemia, unspecified: Secondary | ICD-10-CM

## 2013-09-06 DIAGNOSIS — F3289 Other specified depressive episodes: Secondary | ICD-10-CM

## 2013-09-06 DIAGNOSIS — M810 Age-related osteoporosis without current pathological fracture: Secondary | ICD-10-CM

## 2013-09-06 DIAGNOSIS — F32A Depression, unspecified: Secondary | ICD-10-CM | POA: Insufficient documentation

## 2013-09-06 DIAGNOSIS — C50919 Malignant neoplasm of unspecified site of unspecified female breast: Secondary | ICD-10-CM

## 2013-09-06 DIAGNOSIS — F329 Major depressive disorder, single episode, unspecified: Secondary | ICD-10-CM

## 2013-09-06 DIAGNOSIS — I10 Essential (primary) hypertension: Secondary | ICD-10-CM

## 2013-09-06 DIAGNOSIS — E039 Hypothyroidism, unspecified: Secondary | ICD-10-CM | POA: Insufficient documentation

## 2013-09-06 NOTE — Progress Notes (Signed)
Patient ID: Tammie [name], female   DOB: Dec 26, 1933, 80 y.o.   MRN: [id number]               PROGRESS NOTE  DATE: 09/06/2013  FACILITY: Nursing Home Location: [facility]  LEVEL OF CARE: SNF (31)  Acute Visit  CHIEF COMPLAINT:  Follow-up hospitalization  HISTORY OF PRESENT ILLNESS: This is a 80 year old female who has been admitted to [facility] on 09/04/13 from [facility]. She fell at a grocery store and sustained intertrochanteric fracture of right hip S/P intramedullary (IM) nail femoral. She has been admitted for a short-term rehabilitation.  REASSESSMENT OF ONGOING PROBLEM(S):  HTN: Pt 's HTN remains stable.  Denies CP, sob, DOE, pedal edema, headaches, dizziness or visual disturbances.  No complications from the medications currently being used.  Last BP : 108/56  DEPRESSION: The depression remains stable. Patient denies ongoing feelings of sadness, insomnia, anedhonia or lack of appetite. No complications reported from the medications currently being used. Staff do not report behavioral problems.  OSTEOPOROSIS: The patient's osteoporosis remains stable. The patient denies recent worsening of pain and the range of motion remains stable. There has not been any evidence of fractures. No complications noted from the medications presently being used.  PAST MEDICAL HISTORY : Reviewed.  No changes.  CURRENT MEDICATIONS: Reviewed per [facility]  REVIEW OF SYSTEMS:  GENERAL: no change in appetite, no fatigue, no weight changes, no fever, chills or weakness RESPIRATORY: no cough, SOB, DOE, wheezing, hemoptysis CARDIAC: no chest pain, edema or palpitations GI: no abdominal pain, diarrhea, constipation, heart burn, nausea or vomiting  PHYSICAL EXAMINATION  VS:  T 98.1      P 72     RR18      BP108/56     POX94 %      GENERAL: no acute distress, normal body habitus EYES: conjunctivae normal, sclerae normal, normal eye lids NECK: supple, trachea midline, no  neck masses, no thyroid tenderness, no thyromegaly LYMPHATICS: no LAN in the neck, no supraclavicular LAN RESPIRATORY: breathing is even & unlabored, BS CTAB CARDIAC: RRR, no murmur,no extra heart sounds, no edema GI: abdomen soft, normal BS, no masses, no tenderness, no hepatomegaly, no splenomegaly PSYCHIATRIC: the patient is alert & oriented to person, affect & behavior appropriate  LABS/RADIOLOGY: Labs reviewed: Basic Metabolic Panel:  Recent Labs  09/01/13 0418 09/02/13 0526 09/03/13 0600  NA 144 142 140  K 4.0 4.7 3.9  CL 108 106 105  CO2 22 22 22   GLUCOSE 117* 131* 98  BUN 14 14 17   CREATININE 1.01 0.91 0.90  CALCIUM 9.1 8.4 8.3*   Liver Function Tests:  Recent Labs  01/18/13 0918 07/03/13 1040  AST 26 23  ALT 25 19  ALKPHOS 74 79  BILITOT 0.51 0.50  PROT 8.1 8.5*  ALBUMIN 3.9 4.3   CBC:  Recent Labs  08/31/13 1421  09/02/13 0526 09/03/13 0600 09/04/13 0625  WBC 11.7*  < > 11.2* 9.5 8.8  NEUTROABS 10.1*  --   --   --   --   HGB 13.2  < > 10.4* 9.6* 9.6*  HCT 39.2  < > 31.5* 29.5* 29.3*  MCV 93.3  < > 93.8 93.4 93.6  PLT 165  < > 124* 122* 127*  < > = values in this interval not displayed.    ASSESSMENT/PLAN:  Intertrochanteric fracture of right hip status post intramedullary nail femoral -  for rehabilitation  Hypothyroidism - continue Synthroid  Hypertension - well controlled; continue amlodipine benazepril and Lasix  Hyperlipidemia - continue Zocor  Depression - continue Prozac  Osteoporosis - continue Fosamax  Breast cancer, history - continue Arimidex     CPT CODE: 66599

## 2013-09-09 ENCOUNTER — Encounter (HOSPITAL_COMMUNITY): Payer: Self-pay | Admitting: Orthopedic Surgery

## 2013-09-10 ENCOUNTER — Telehealth: Payer: Self-pay | Admitting: Family

## 2013-09-10 NOTE — Telephone Encounter (Addendum)
Message copied by Gena Fray on Tue Sep 10, 2013 12:26 PM ------      Message from: Merleen Nicely      Created: Tue Sep 10, 2013  9:43 AM      Regarding: CANCELLATION FOR 2/17       We need to cancel [facility] appointment. She will not be rescheduling at this time.            Thanks      Ebony Hail ------  09/10/13: canceled appt, dpm

## 2013-09-11 ENCOUNTER — Non-Acute Institutional Stay (SKILLED_NURSING_FACILITY): Payer: Medicare Other | Admitting: Internal Medicine

## 2013-09-11 DIAGNOSIS — S72143A Displaced intertrochanteric fracture of unspecified femur, initial encounter for closed fracture: Secondary | ICD-10-CM

## 2013-09-11 DIAGNOSIS — I10 Essential (primary) hypertension: Secondary | ICD-10-CM

## 2013-09-11 DIAGNOSIS — C50919 Malignant neoplasm of unspecified site of unspecified female breast: Secondary | ICD-10-CM

## 2013-09-11 DIAGNOSIS — E039 Hypothyroidism, unspecified: Secondary | ICD-10-CM

## 2013-09-17 ENCOUNTER — Ambulatory Visit: Payer: Medicare Other | Admitting: Family

## 2013-09-17 ENCOUNTER — Other Ambulatory Visit (HOSPITAL_COMMUNITY): Payer: Medicare Other

## 2013-09-20 ENCOUNTER — Non-Acute Institutional Stay (SKILLED_NURSING_FACILITY): Payer: Medicare Other | Admitting: Adult Health

## 2013-09-20 DIAGNOSIS — F329 Major depressive disorder, single episode, unspecified: Secondary | ICD-10-CM

## 2013-09-20 DIAGNOSIS — E785 Hyperlipidemia, unspecified: Secondary | ICD-10-CM

## 2013-09-20 DIAGNOSIS — F3289 Other specified depressive episodes: Secondary | ICD-10-CM

## 2013-09-20 DIAGNOSIS — F32A Depression, unspecified: Secondary | ICD-10-CM

## 2013-09-20 DIAGNOSIS — C50919 Malignant neoplasm of unspecified site of unspecified female breast: Secondary | ICD-10-CM

## 2013-09-20 DIAGNOSIS — I10 Essential (primary) hypertension: Secondary | ICD-10-CM

## 2013-09-20 DIAGNOSIS — M810 Age-related osteoporosis without current pathological fracture: Secondary | ICD-10-CM

## 2013-09-20 DIAGNOSIS — S72143A Displaced intertrochanteric fracture of unspecified femur, initial encounter for closed fracture: Secondary | ICD-10-CM

## 2013-09-20 DIAGNOSIS — E039 Hypothyroidism, unspecified: Secondary | ICD-10-CM

## 2013-09-21 NOTE — Progress Notes (Signed)
HISTORY & PHYSICAL  DATE: 09/11/2013   FACILITY: [facility]  LEVEL OF CARE: SNF (31)  ALLERGIES:  No Known Allergies  CHIEF COMPLAINT:  Manage right hip intertrochanteric fracture, hypothyroidism and hypertension  HISTORY OF PRESENT ILLNESS: Patient is a 80 year old Caucasian female.  HIP FRACTURE: The patient had a mechanical fall and sustained a femur fracture.  Patient subsequently underwent surgical repair and tolerated the procedure well. Patient is admitted to this facility for short-term rehabilitation. Patient denies hip pain currently. No complications reported from the pain medications currently being used.  HTN: Pt 's HTN remains stable.  Denies CP, sob, DOE, pedal edema, headaches, dizziness or visual disturbances.  No complications from the medications currently being used.  Last BP : 149/56, 169/60, 143/49  HYPOTHYROIDISM: The hypothyroidism remains stable. No complications noted from the medications presently being used.  The patient denies fatigue or constipation.  Last TSH 0.459.  PAST MEDICAL HISTORY :  Past Medical History  Diagnosis Date  . Breast lump     left  . Depression   . Hyperlipidemia   . Hypertension   . GERD (gastroesophageal reflux disease)   . Cancer     breast  . Breast cancer T1aN0 S/P lumpectomy SLN Bx 01/2011 03/25/2011  . Myocardial infarction 1999    mi  . Bilateral carotid artery stenosis     hx  . History of ETOH abuse     member AA 22.5 years  . Thyroid disease     hypo  . Lumbar herniated disc     PAST SURGICAL HISTORY: Past Surgical History  Procedure Laterality Date  . Breast lumpectomy  2012  . Breast biopsy  200, 88  . Breast biopsy  1985/2000    2 BENIGN LEFT BREAST BXS  . Femur im nail Right 09/01/2013    Procedure: INTRAMEDULLARY (IM) NAIL FEMORAL;  Surgeon: [name], MD;  Location: [facility];  Service: Orthopedics;  Laterality: Right;    SOCIAL HISTORY:  reports that she quit  smoking about 8 years ago. Her smoking use included Cigarettes. She smoked 0.00 packs per day. She has never used smokeless tobacco. She reports that she does not drink alcohol or use illicit drugs.  FAMILY HISTORY:  Family History  Problem Relation Age of Onset  . Breast cancer Paternal Aunt   . Heart attack Mother   . Hypertension Mother   . Hyperlipidemia Mother   . Heart attack Father   . Hypertension Father   . Hyperlipidemia Father     CURRENT MEDICATIONS: Reviewed per [facility]  REVIEW OF SYSTEMS:  See HPI otherwise 14 point ROS is negative.  PHYSICAL EXAMINATION  VS:  See VS section  GENERAL: no acute distress, normal body habitus EYES: conjunctivae normal, sclerae normal, normal eye lids MOUTH/THROAT: lips without lesions,no lesions in the mouth,tongue is without lesions,uvula elevates in midline NECK: supple, trachea midline, no neck masses, no thyroid tenderness, no thyromegaly LYMPHATICS: no LAN in the neck, no supraclavicular LAN RESPIRATORY: breathing is even & unlabored, BS CTAB CARDIAC: RRR, no murmur,no extra heart sounds, no edema GI:  ABDOMEN: abdomen soft, normal BS, no masses, no tenderness  LIVER/SPLEEN: no hepatomegaly, no splenomegaly MUSCULOSKELETAL: HEAD: normal to inspection & palpation BACK: no kyphosis, scoliosis or spinal processes tenderness EXTREMITIES: LEFT UPPER EXTREMITY: full range of motion, normal strength & tone RIGHT UPPER EXTREMITY:  full range of motion, normal strength & tone LEFT LOWER EXTREMITY:  full range  of motion, normal strength & tone RIGHT LOWER EXTREMITY:  range of motion not tested due to surgery, normal strength & tone PSYCHIATRIC: the patient is alert & oriented to person, affect & behavior appropriate  LABS/RADIOLOGY:  Labs reviewed: Basic Metabolic Panel:  Recent Labs  09/01/13 0418 09/02/13 0526 09/03/13 0600  NA 144 142 140  K 4.0 4.7 3.9  CL 108 106 105  CO2 22 22 22   GLUCOSE 117* 131* 98  BUN 14 14 17     CREATININE 1.01 0.91 0.90  CALCIUM 9.1 8.4 8.3*   Liver Function Tests:  Recent Labs  01/18/13 0918 07/03/13 1040  AST 26 23  ALT 25 19  ALKPHOS 74 79  BILITOT 0.51 0.50  PROT 8.1 8.5*  ALBUMIN 3.9 4.3   CBC:  Recent Labs  08/31/13 1421  09/02/13 0526 09/03/13 0600 09/04/13 0625  WBC 11.7*  < > 11.2* 9.5 8.8  NEUTROABS 10.1*  --   --   --   --   HGB 13.2  < > 10.4* 9.6* 9.6*  HCT 39.2  < > 31.5* 29.5* 29.3*  MCV 93.3  < > 93.8 93.4 93.6  PLT 165  < > 124* 122* 127*  < > = values in this interval not displayed.    RIGHT HIP - COMPLETE 2+ VIEW   COMPARISON:  None.   FINDINGS: There is a nondisplaced right intertrochanteric fracture with a fracture cleft extending into the proximal femoral diaphysis. There are mild osteoarthritic changes of the right hip. There is no dislocation.   The left hip is unremarkable.   There is degenerative disc disease of the lower lumbar spine.   The SI joints are unremarkable.   There is a coarsely calcified left pelvic mass likely representing a calcified uterine fibroid.   IMPRESSION: Nondisplaced right intertrochanteric fracture with fracture cleft extending into the proximal femoral diaphysis. No dislocation.   Mild osteoarthritis of the right hip.   PORTABLE CHEST - 1 VIEW   COMPARISON:  Portable exam 1432 hr compared to 02/17/2011   FINDINGS: Normal heart size, mediastinal contours and pulmonary vascularity for technique.   Atherosclerotic calcification aorta.   Mild emphysematous changes.   No acute infiltrate, pleural effusion or pneumothorax.   Bones unremarkable.   IMPRESSION: Mild emphysematous changes.   No acute abnormalities.     RIGHT FEMUR - 2 VIEW; DG C-ARM 1-60 MIN   COMPARISON:  Plain films of the right hip 08/31/2013.   FINDINGS: The patient has a new gamma nail with along IM rod and a distal interlocking screw for fixation of an intertrochanteric fracture with subtrochanteric  extension. Hardware is intact. No new abnormality.   IMPRESSION: ORIF right intertrochanteric fracture with subtrochanteric extension.   PORTABLE PELVIS 1-2 VIEWS   COMPARISON:  Fluoroscopic images performed 630 today   FINDINGS: Single AP image shows intra medullary rod on the right femur with transcervical orthopedic screw. There is fracture through the proximal right femur. Lesser trochanter is mildly displaced. Mild displacement of the base of the greater trochanter also identified postsurgical change including soft tissue edema and a small amount of soft tissue air along the lateral right femur noted.   IMPRESSION: Postoperative changes related to ORIF of proximal femur fracture   PORTABLE RIGHT HIP - 1 VIEW   COMPARISON:  Reason fluoroscopic study   FINDINGS: ORIF proximal right femur fracture with intra medullary rod noted appropriately within the femur. Postoperative change   IMPRESSION: Postoperative change  Transthoracic Echocardiography  Patient:  Vanezza, Corr MR #:       [id number] Study Date: 09/01/2013 Gender:     F Age:        36 Height:     157.5cm Weight:     54.4kg BSA:        1.88m^2 Pt. Status: Room:       5N32C    ADMITTING    Marchia Bond  SONOGRAPHER  Mauricio Po, RDCS, CCT  ORDERING     Bensimhon, Daniel  ATTENDING    Mikhail, [facility]  PERFORMING   Chmg, Inpatient cc:  ------------------------------------------------------------ LV EF: 60% -   65%  ------------------------------------------------------------ History:   PMH:  Preoperative exam.  Risk factors: Bradycardia. History of Breast Cancer. Hip Fracture. Hypertension. Dyslipidemia.  ------------------------------------------------------------ Study Conclusions  - Left ventricle: The cavity size was normal. Wall thickness   was normal. Systolic function was normal. The estimated   ejection fraction was in the range of 60% to 65%. Wall   motion was normal; there  were no regional wall motion   abnormalities. Doppler parameters are consistent with   abnormal left ventricular relaxation (grade 1 diastolic   dysfunction). - Aortic valve: Mildly calcified annulus. Trileaflet; mildly   calcified leaflets. Moderate calcification involving the   noncoronary cusp. No significant regurgitation. - Mitral valve: Calcified annulus. Trivial regurgitation. - Left atrium: The atrium was mildly dilated. - Right atrium: Central venous pressure: 28mm Hg (est). - Atrial septum: No defect or patent foramen ovale was   identified. - Tricuspid valve: Trivial regurgitation. - Pulmonary arteries: Systolic pressure could not be   accurately estimated. - Pericardium, extracardiac: A prominent pericardial fat pad   was present. Impressions:  - Normal LV wall thickness with LVEF 60-65%, grade 1   diastolic dysfunction. Mild left atrial enlargement. MAC   with trivial mitral regurgitation. Sclerotic aortic valve.   Unable to assess PASP. Transthoracic echocardiography.  M-mode, complete 2D, spectral Doppler, and color Doppler.  Height:  Height: 157.5cm. Height: 62in.  Weight:  Weight: 54.4kg. Weight: 119.8lb.  Body mass index:  BMI: 21.9kg/m^2.  Body surface area:    BSA: 1.64m^2.  Blood pressure:     164/48.  Patient status:  Inpatient.  Location:  Bedside.  ------------------------------------------------------------  ------------------------------------------------------------ Left ventricle:  The cavity size was normal. Wall thickness was normal. Systolic function was normal. The estimated ejection fraction was in the range of 60% to 65%. Wall motion was normal; there were no regional wall motion abnormalities. Doppler parameters are consistent with abnormal left ventricular relaxation (grade 1 diastolic dysfunction).  ------------------------------------------------------------ Aortic valve:   Mildly calcified annulus. Trileaflet; mildly calcified leaflets.   Moderate calcification involving the noncoronary cusp.  Doppler:   There was no stenosis.    No significant regurgitation.  ------------------------------------------------------------ Aorta:  Aortic root: The aortic root was normal in size.  ------------------------------------------------------------ Mitral valve:   Calcified annulus.  Doppler:   Trivial regurgitation.    Peak gradient: 20mm Hg (D).  ------------------------------------------------------------ Left atrium:  The atrium was mildly dilated.  ------------------------------------------------------------ Atrial septum:  No defect or patent foramen ovale was identified.  ------------------------------------------------------------ Right ventricle:  The cavity size was normal. Systolic function was normal.  ------------------------------------------------------------ Pulmonic valve:    The valve appears to be grossly normal.  Doppler:   Trivial regurgitation.  ------------------------------------------------------------ Tricuspid valve:   The valve appears to be grossly normal.  Doppler:   Trivial regurgitation.  ------------------------------------------------------------ Pulmonary artery:    Systolic pressure could not be accurately estimated.  ------------------------------------------------------------  Right atrium:  The atrium was normal in size.  ------------------------------------------------------------ Pericardium:  A prominent pericardial fat pad was present. There was no pericardial effusion.  ------------------------------------------------------------ Systemic veins: Inferior vena cava: The vessel was normal in size; the respirophasic diameter changes were in the normal range (= 50%); findings are consistent with normal central venous pressure.  ------------------------------------------------------------  2D measurements        Normal  Doppler               Normal Left ventricle                  measurements LVID ED,   38.4 mm     43-52   Left ventricle chord,                         Ea, lat      8.05 cm/ ------- PLAX                           ann, tiss         s LVID ES,     25 mm     23-38   DP chord,                         E/Ea, lat   11.39     ------- PLAX                           ann, tiss FS, chord,   35 %      >29     DP PLAX                           Ea, med      6.74 cm/ ------- LVPW, ED   10.7 mm     ------  ann, tiss         s IVS/LVPW   0.87        <1.3    DP ratio, ED                      E/Ea, med   13.61     ------- Ventricular septum             ann, tiss IVS, ED    9.36 mm     ------  DP Aorta                          Mitral valve Root diam,   28 mm     ------  Peak E vel   91.7 cm/ ------- ED                                               s Left atrium                    Peak A vel    104 cm/ ------- AP dim       27 mm     ------                    s AP dim     1.75 cm/m^2 <  2.2    Deceleratio   216 ms  150-230 index                          n time Vol, S     52.5 ml     ------  Peak            3 mm  ------- Vol index, 34.1 ml/m^2 ------  gradient, D       Hg S                              Peak E/A      0.9     -------                                ratio                                Systemic veins                                Estimated       3 mm  -------                                CVP               Hg                                Right ventricle                                Sa vel, lat  13.4 cm/ -------                                ann, tiss         s                                DP   ------------------------------------------------------------   ASSESSMENT/PLAN:  Right intertrochanteric hip fracture -status post IM nail. Continue rehabilitation hypertension-uncontrolled problem. Increase benazepril to 30 mg daily Hypothyroidism-well-controlled History of breast cancer-continue Arimidex Constipation-well  controlled Hyperlipidemia-continue Zocor Check CBC  I have reviewed patient's medical records received at admission/from hospitalization.  CPT CODE: 24401  Gayani Y Dasanayaka, [place] [phone]

## 2013-09-24 ENCOUNTER — Encounter: Payer: Self-pay | Admitting: Adult Health

## 2013-09-24 NOTE — Progress Notes (Signed)
Patient ID: Tammie [name], female   DOB: 08-Jun-1934, 80 y.o.   MRN: [id number]                 PROGRESS NOTE  DATE: 09/20/13   FACILITY: Nursing Home Location: [facility]  LEVEL OF CARE: SNF (31)  Acute Visit  CHIEF COMPLAINT:  Discharge Notes  HISTORY OF PRESENT ILLNESS: This is a 80 year old female who has been admitted to [facility] on 09/04/13 from [facility]. She fell at a grocery store and sustained intertrochanteric fracture of right hip S/P intramedullary (IM) nail femoral. Patient was admitted to this facility for short-term rehabilitation after the patient's recent hospitalization.  Patient has completed SNF rehabilitation and therapy has cleared the patient for discharge.  REASSESSMENT OF ONGOING PROBLEM(S):  HTN: Pt 's HTN remains stable.  Denies CP, sob, DOE, pedal edema, headaches, dizziness or visual disturbances.  No complications from the medications currently being used.  Last BP : 138/77  HYPERLIPIDEMIA: No complications from the medications presently being used.   HYPOTHYROIDISM: The hypothyroidism remains stable. No complications noted from the medications presently being used.  The patient denies fatigue or constipation.    PAST MEDICAL HISTORY : Reviewed.  No changes.  CURRENT MEDICATIONS: Reviewed per [facility]  REVIEW OF SYSTEMS:  GENERAL: no change in appetite, no fatigue, no weight changes, no fever, chills or weakness RESPIRATORY: no cough, SOB, DOE, wheezing, hemoptysis CARDIAC: no chest pain, edema or palpitations GI: no abdominal pain, diarrhea, constipation, heart burn, nausea or vomiting  PHYSICAL EXAMINATION  GENERAL: no acute distress, normal body habitus NECK: supple, trachea midline, no neck masses, no thyroid tenderness, no thyromegaly LYMPHATICS: no LAN in the neck, no supraclavicular LAN RESPIRATORY: breathing is even & unlabored, BS CTAB CARDIAC: RRR, no murmur,no extra heart sounds, no edema GI: abdomen soft,  normal BS, no masses, no tenderness, no hepatomegaly, no splenomegaly PSYCHIATRIC: the patient is alert & oriented to person, affect & behavior appropriate  LABS/RADIOLOGY: Labs reviewed: Basic Metabolic Panel:  Recent Labs  09/01/13 0418 09/02/13 0526 09/03/13 0600  NA 144 142 140  K 4.0 4.7 3.9  CL 108 106 105  CO2 22 22 22   GLUCOSE 117* 131* 98  BUN 14 14 17   CREATININE 1.01 0.91 0.90  CALCIUM 9.1 8.4 8.3*   Liver Function Tests:  Recent Labs  01/18/13 0918 07/03/13 1040  AST 26 23  ALT 25 19  ALKPHOS 74 79  BILITOT 0.51 0.50  PROT 8.1 8.5*  ALBUMIN 3.9 4.3   CBC:  Recent Labs  08/31/13 1421  09/02/13 0526 09/03/13 0600 09/04/13 0625  WBC 11.7*  < > 11.2* 9.5 8.8  NEUTROABS 10.1*  --   --   --   --   HGB 13.2  < > 10.4* 9.6* 9.6*  HCT 39.2  < > 31.5* 29.5* 29.3*  MCV 93.3  < > 93.8 93.4 93.6  PLT 165  < > 124* 122* 127*  < > = values in this interval not displayed.    ASSESSMENT/PLAN:  Intertrochanteric fracture of right hip status post intramedullary nail femoral -  for Home health PT, OT, Nursing and CNA  Hypothyroidism - stable  Hypertension - well controlled  Hyperlipidemia - continue Zocor  Depression - stable  Osteoporosis - continue Fosamax  Breast cancer, history - continue Arimidex   I have filled out patient's discharge paperwork and written prescriptions.  Patient will receive home health PT, OT, nursing and CNA.  Total discharge time: Less than 30 minutes Discharge time involved coordination of the discharge process with Education officer, museum, nursing staff and therapy department. Medical justification for home health services verified.    CPT CODE: 73668  Damontay Alred Vargas-NP [facility] [phone]

## 2013-10-09 ENCOUNTER — Other Ambulatory Visit: Payer: Self-pay | Admitting: *Deleted

## 2013-10-09 MED ORDER — ALENDRONATE SODIUM 70 MG PO TABS: 70.0000 mg | ORAL_TABLET | ORAL | Status: DC

## 2013-11-25 ENCOUNTER — Other Ambulatory Visit: Payer: Self-pay | Admitting: Family Medicine

## 2013-11-25 DIAGNOSIS — Z9889 Other specified postprocedural states: Secondary | ICD-10-CM

## 2013-11-25 DIAGNOSIS — Z853 Personal history of malignant neoplasm of breast: Secondary | ICD-10-CM

## 2013-11-26 ENCOUNTER — Encounter: Payer: Self-pay | Admitting: Family

## 2013-11-27 ENCOUNTER — Encounter: Payer: Self-pay | Admitting: Family

## 2013-11-27 ENCOUNTER — Ambulatory Visit (INDEPENDENT_AMBULATORY_CARE_PROVIDER_SITE_OTHER): Payer: Medicare Other | Admitting: Family

## 2013-11-27 ENCOUNTER — Ambulatory Visit (HOSPITAL_COMMUNITY)
Admission: RE | Admit: 2013-11-27 | Discharge: 2013-11-27 | Disposition: A | Discharge status: Home / Self Care | Payer: Medicare Other | Source: Ambulatory Visit | Attending: Family | Admitting: Family

## 2013-11-27 VITALS — BP 152/65 | HR 55 | Resp 16 | Ht 62.0 in | Wt 116.0 lb

## 2013-11-27 DIAGNOSIS — I6529 Occlusion and stenosis of unspecified carotid artery: Secondary | ICD-10-CM

## 2013-11-27 NOTE — Patient Instructions (Signed)

## 2013-11-27 NOTE — Progress Notes (Signed)
Established Carotid Patient   History of Present Illness  Tammie [name] is a 80 y.o. female patient of Dr. Oneida Alar followed for known carotid stenosis.   Patient has not had previous carotid artery intervention. Patient denies any claudication type symptoms.  Patient has Negative history of TIA or stroke symptom.  The patient denies amaurosis fugax or monocular blindness.  The patient  denies facial drooping.  Pt. denies hemiplegia.  The patient denies receptive or expressive aphasia.   Pt states that her oncologist increased her ASA dose from 81 to 325 mg daily. Pt reports New Medical or Surgical History: fell and fractured right hip January, 2015, was repaired, states [facility] was excellent. She states her right leg is an inch shorter since a childhood accident, will be getting a shoe insert for her right foot.  Pt Diabetic: No Pt smoker: former smoker, quit 8 years ago Has been sober for 25 years.  Pt meds include: Statin : Yes ASA: Yes Other anticoagulants/antiplatelets: Plavix   Past Medical History  Diagnosis Date  . Breast lump     left  . Depression   . Hyperlipidemia   . Hypertension   . GERD (gastroesophageal reflux disease)   . Cancer     breast  . Breast cancer T1aN0 S/P lumpectomy SLN Bx 01/2011 03/25/2011  . Myocardial infarction 1999    mi  . Bilateral carotid artery stenosis     hx  . History of ETOH abuse     member AA 22.5 years  . Thyroid disease     hypo  . Lumbar herniated disc   . Fall from grocery cart Jan. 31, 2015    Fx  Right  Hip    Social History History  Substance Use Topics  . Smoking status: Former Smoker    Types: Cigarettes    Quit date: 09/29/2004  . Smokeless tobacco: Never Used     Comment: 2007- quit  . Alcohol Use: No    Family History Family History  Problem Relation Age of Onset  . Breast cancer Paternal Aunt   . Heart attack Mother   . Hypertension Mother   . Hyperlipidemia Mother   . Heart attack  Father   . Hypertension Father   . Hyperlipidemia Father     Surgical History Past Surgical History  Procedure Laterality Date  . Breast lumpectomy Left 2012  . Breast biopsy  200, 88  . Breast biopsy  1985/2000    2 BENIGN LEFT BREAST BXS  . Femur im nail Right 09/01/2013    Procedure: INTRAMEDULLARY (IM) NAIL FEMORAL;  Surgeon: [name], MD;  Location: [facility];  Service: Orthopedics;  Laterality: Right;  . Fracture surgery Right Jan. 31, 2015    Hip    No Known Allergies  Current Outpatient Prescriptions  Medication Sig Dispense Refill  . acetaminophen (TYLENOL) 325 MG tablet Take 2 tablets (650 mg total) by mouth every 6 (six) hours as needed for mild pain (or Fever >/= 101).      [name] alendronate (FOSAMAX) 70 MG tablet Take 1 tablet (70 mg total) by mouth once a week. Take with a full glass of water on an empty stomach.  12 tablet  1  . amLODipine (NORVASC) 10 MG tablet Take 10 mg by mouth daily.        [name] anastrozole (ARIMIDEX) 1 MG tablet Take 1 tablet (1 mg total) by mouth daily.  90 tablet  12  . aspirin 325 MG  EC tablet Take 325 mg by mouth daily.      . benazepril (LOTENSIN) 20 MG tablet Take 20 mg by mouth daily. Take 1 1/2 tab = 30 mg      . Calcium Carbonate (CALTRATE 600 PO) Take 1 tablet by mouth daily.       . Cholecalciferol (VITAMIN D-3) 1000 UNITS CAPS Take 1 capsule by mouth daily.       . clopidogrel (PLAVIX) 75 MG tablet Take 75 mg by mouth at bedtime.       [name] FLUoxetine (PROZAC) 20 MG tablet Take 20 mg by mouth daily.        . furosemide (LASIX) 40 MG tablet Take 40 mg by mouth daily.       [name] levothyroxine (SYNTHROID, LEVOTHROID) 88 MCG tablet Take 88 mcg by mouth daily.      . Multiple Vitamins-Minerals (CENTRUM SILVER PO) Take by mouth daily.      . simvastatin (ZOCOR) 20 MG tablet Take 20 mg by mouth at bedtime.       [name] alum & mag hydroxide-simeth (MAALOX/MYLANTA) 200-200-20 MG/5ML suspension Take 30 mLs by mouth every 4 (four) hours as needed for  indigestion.  355 mL  0  . aspirin EC 81 MG tablet Take 81 mg by mouth daily.      . bisacodyl (DULCOLAX) 10 MG suppository Place 1 suppository (10 mg total) rectally daily as needed for moderate constipation.  12 suppository  0  . docusate sodium 100 MG CAPS Take 100 mg by mouth 2 (two) times daily.  10 capsule  0  . enoxaparin (LOVENOX) 40 MG/0.4ML injection Inject 0.4 mLs (40 mg total) into the skin daily.  21 Syringe  0  . feeding supplement, RESOURCE BREEZE, (RESOURCE BREEZE) LIQD Take 1 Container by mouth 2 (two) times daily between meals.    0  . HYDROcodone-acetaminophen (NORCO) 5-325 MG per tablet Take 1-2 tablets by mouth every 6 (six) hours as needed. MAXIMUM TOTAL ACETAMINOPHEN DOSE IS 4000 MG PER DAY  30 tablet  0  . menthol-cetylpyridinium (CEPACOL) 3 MG lozenge Take 1 lozenge (3 mg total) by mouth as needed for sore throat (sore throat).  100 tablet  12  . sennosides-docusate sodium (SENOKOT-S) 8.6-50 MG tablet Take 2 tablets by mouth daily.  30 tablet  1   No current facility-administered medications for this visit.    Review of Systems : See HPI for pertinent positives and negatives.  Physical Examination  Filed Vitals:   11/27/13 1134  BP: 152/65  Pulse: 55  Resp: 16  Birth weight not on file Body mass index is 21.21 kg/(m^2).  General: WDWN female in NAD GAIT: right leg shorter Eyes: PERRLA Pulmonary:  Non-labored, CTAB, Negative  Rales, Negative rhonchi, & Negative wheezing.  Cardiac: regular Rhythm ,  Negative detected murmur.  VASCULAR EXAM Carotid Bruits Left Right   Negative Negative     Radial pulses are 2+ palpable and equal.  LE Pulses LEFT RIGHT       POPLITEAL  not palpable   not palpable       POSTERIOR TIBIAL  not palpable   not palpable        DORSALIS PEDIS      ANTERIOR TIBIAL  palpable   palpable      Gastrointestinal: soft, nontender, BS WNL, no r/g,  negative masses.  Musculoskeletal: Negative muscle atrophy/wasting. M/S 5/5 in UE's, 4/5 in LE's, Extremities without ischemic changes.  Neurologic: A&O X 3; Appropriate Affect ; SENSATION ;normal;  Speech is normal CN 2-12 intact except some hearing loss, Pain and light touch intact in extremities, Motor exam as listed above.   Non-Invasive Vascular Imaging CAROTID DUPLEX 11/27/2013   CEREBROVASCULAR DUPLEX EVALUATION    INDICATION: Carotid stenosis     PREVIOUS INTERVENTION(S): N/A    DUPLEX EXAM:     RIGHT  LEFT  Peak Systolic Velocities (cm/s) End Diastolic Velocities (cm/s) Plaque LOCATION Peak Systolic Velocities (cm/s) End Diastolic Velocities (cm/s) Plaque  88 14  CCA PROXIMAL 89 15   75 14  CCA MID 76 14 HT  93 18 CP CCA DISTAL 88 18 CP  312 11 CP ECA 375 31 CP  143 26 CP ICA PROXIMAL 212 44 CP  125 22  ICA MID 150 26   115 21  ICA DISTAL 80 13     1.91 ICA / CCA Ratio (PSV) 2.80  Antegrade Vertebral Flow Antegrade  694 Brachial Systolic Pressure (mmHg) 854  Triphasic Brachial Artery Waveforms Triphasic    Plaque Morphology:  HM = Homogeneous, HT = Heterogeneous, CP = Calcific Plaque, SP = Smooth Plaque, IP = Irregular Plaque     ADDITIONAL FINDINGS:     IMPRESSION: Right internal carotid artery stenosis present in the less than 40% range, which may be underestimated due to presence of calcific plaque making Doppler interrogation difficult. Left internal carotid artery stenosis present in the 40%-59% range, which may be underestimated due to calcific plaque present making Doppler interrogation difficult. Bilateral external carotid artery stenosis present.    Compared to the previous exam:  Unchanged since previous study on 08/30/2012.      Assessment: Tammie [name] is a 80 y.o. female who presents for yearly Duplex evaluation of carotid artery stenosis. She has no history of stroke or TIA  activity. Right internal carotid artery stenosis present in the less than 40% range, which may be underestimated due to presence of calcific plaque making Doppler interrogation difficult. Left internal carotid artery stenosis present in the 40%-59% range, which may be underestimated due to calcific plaque present making Doppler interrogation difficult. Bilateral external carotid artery stenosis present. Unchanged since previous study on 08/30/2012.  Plan: Follow-up in 1  with Carotid Duplex scan.   I discussed in depth with the patient the nature of atherosclerosis, and emphasized the importance of maximal medical management including strict control of blood pressure, blood glucose, and lipid levels, obtaining regular exercise, and continued cessation of smoking.  The patient is aware that without maximal medical management the underlying atherosclerotic disease process will progress, limiting the benefit of any interventions. The patient was given information about stroke prevention and what symptoms should prompt the patient to seek immediate medical care. Thank you for allowing [place] to participate in this patient's care.  Clemon Chambers, RN, MSN, FNP-C [facility] Office: [phone]  Clinic Physician: Scot Dock  11/27/2013 12:01 PM

## 2013-12-26 ENCOUNTER — Ambulatory Visit
Admission: RE | Admit: 2013-12-26 | Discharge: 2013-12-26 | Disposition: A | Discharge status: Home / Self Care | Payer: Medicare Other | Source: Ambulatory Visit | Attending: Family Medicine | Admitting: Family Medicine

## 2013-12-26 DIAGNOSIS — Z9889 Other specified postprocedural states: Secondary | ICD-10-CM

## 2013-12-26 DIAGNOSIS — Z853 Personal history of malignant neoplasm of breast: Secondary | ICD-10-CM

## 2014-01-07 ENCOUNTER — Telehealth: Payer: Self-pay | Admitting: Hematology and Oncology

## 2014-01-07 NOTE — Telephone Encounter (Signed)
, °

## 2014-01-13 ENCOUNTER — Other Ambulatory Visit: Payer: Medicare Other

## 2014-01-13 ENCOUNTER — Ambulatory Visit: Payer: Medicare Other | Admitting: Oncology

## 2014-04-02 ENCOUNTER — Telehealth: Payer: Self-pay | Admitting: Adult Health

## 2014-04-02 NOTE — Telephone Encounter (Signed)
, °

## 2014-04-03 ENCOUNTER — Other Ambulatory Visit: Payer: Medicare Other

## 2014-04-03 ENCOUNTER — Ambulatory Visit: Payer: Self-pay | Admitting: Hematology and Oncology

## 2014-04-08 ENCOUNTER — Other Ambulatory Visit: Payer: Self-pay | Admitting: *Deleted

## 2014-04-08 DIAGNOSIS — C50912 Malignant neoplasm of unspecified site of left female breast: Secondary | ICD-10-CM

## 2014-04-09 ENCOUNTER — Telehealth: Payer: Self-pay | Admitting: Adult Health

## 2014-04-09 ENCOUNTER — Other Ambulatory Visit: Payer: Medicare Other

## 2014-04-09 ENCOUNTER — Ambulatory Visit: Payer: Self-pay | Admitting: Adult Health

## 2014-04-09 NOTE — Telephone Encounter (Signed)
, °

## 2014-04-18 ENCOUNTER — Other Ambulatory Visit: Payer: Medicare Other

## 2014-04-18 ENCOUNTER — Ambulatory Visit: Payer: Self-pay | Admitting: Adult Health

## 2014-04-18 ENCOUNTER — Telehealth: Payer: Self-pay | Admitting: Adult Health

## 2014-04-18 NOTE — Telephone Encounter (Signed)
, °

## 2014-04-22 ENCOUNTER — Telehealth: Payer: Self-pay | Admitting: Hematology and Oncology

## 2014-04-22 ENCOUNTER — Telehealth: Payer: Self-pay | Admitting: Adult Health

## 2014-04-22 NOTE — Telephone Encounter (Signed)
, °

## 2014-04-23 ENCOUNTER — Ambulatory Visit: Payer: Self-pay | Admitting: Adult Health

## 2014-04-23 ENCOUNTER — Ambulatory Visit: Payer: Self-pay | Admitting: Hematology and Oncology

## 2014-04-23 ENCOUNTER — Other Ambulatory Visit: Payer: Medicare Other

## 2014-04-24 ENCOUNTER — Ambulatory Visit (HOSPITAL_BASED_OUTPATIENT_CLINIC_OR_DEPARTMENT_OTHER): Payer: Medicare Other | Admitting: Adult Health

## 2014-04-24 ENCOUNTER — Other Ambulatory Visit (HOSPITAL_BASED_OUTPATIENT_CLINIC_OR_DEPARTMENT_OTHER): Payer: Medicare Other

## 2014-04-24 ENCOUNTER — Encounter: Payer: Self-pay | Admitting: Adult Health

## 2014-04-24 VITALS — BP 146/87 | HR 55 | Temp 98.2°F | Resp 18 | Ht 62.0 in | Wt 116.4 lb

## 2014-04-24 DIAGNOSIS — C50219 Malignant neoplasm of upper-inner quadrant of unspecified female breast: Secondary | ICD-10-CM

## 2014-04-24 DIAGNOSIS — C50912 Malignant neoplasm of unspecified site of left female breast: Secondary | ICD-10-CM

## 2014-04-24 DIAGNOSIS — M810 Age-related osteoporosis without current pathological fracture: Secondary | ICD-10-CM

## 2014-04-24 LAB — COMPREHENSIVE METABOLIC PANEL (CC13)
ALT: 14 U/L (ref 0–55)
ANION GAP: 10 meq/L (ref 3–11)
AST: 19 U/L (ref 5–34)
Albumin: 4.1 g/dL (ref 3.5–5.0)
Alkaline Phosphatase: 79 U/L (ref 40–150)
BILIRUBIN TOTAL: 0.45 mg/dL (ref 0.20–1.20)
BUN: 18.3 mg/dL (ref 7.0–26.0)
CALCIUM: 10.4 mg/dL (ref 8.4–10.4)
CO2: 27 mEq/L (ref 22–29)
Chloride: 103 mEq/L (ref 98–109)
Creatinine: 1.2 mg/dL — ABNORMAL HIGH (ref 0.6–1.1)
Glucose: 86 mg/dl (ref 70–140)
Potassium: 3.9 mEq/L (ref 3.5–5.1)
Sodium: 141 mEq/L (ref 136–145)
Total Protein: 8.6 g/dL — ABNORMAL HIGH (ref 6.4–8.3)

## 2014-04-24 LAB — CBC WITH DIFFERENTIAL/PLATELET
BASO%: 3 % — AB (ref 0.0–2.0)
BASOS ABS: 0.2 10*3/uL — AB (ref 0.0–0.1)
EOS%: 1.7 % (ref 0.0–7.0)
Eosinophils Absolute: 0.1 10*3/uL (ref 0.0–0.5)
HEMATOCRIT: 40.5 % (ref 34.8–46.6)
HEMOGLOBIN: 13.2 g/dL (ref 11.6–15.9)
LYMPH%: 20.3 % (ref 14.0–49.7)
MCH: 30.2 pg (ref 25.1–34.0)
MCHC: 32.6 g/dL (ref 31.5–36.0)
MCV: 92.6 fL (ref 79.5–101.0)
MONO#: 0.8 10*3/uL (ref 0.1–0.9)
MONO%: 10.3 % (ref 0.0–14.0)
NEUT#: 5 10*3/uL (ref 1.5–6.5)
NEUT%: 64.7 % (ref 38.4–76.8)
Platelets: 211 10*3/uL (ref 145–400)
RBC: 4.37 10*6/uL (ref 3.70–5.45)
RDW: 13.7 % (ref 11.2–14.5)
WBC: 7.7 10*3/uL (ref 3.9–10.3)
lymph#: 1.6 10*3/uL (ref 0.9–3.3)

## 2014-04-24 NOTE — Patient Instructions (Signed)
You are doing well.  You have no sign of recurrence.  Continue taking Arimidex daily, and Fosamax weekly.  I recommend healthy diet, exercise, and monthly breast exams.    Breast Self-Awareness Practicing breast self-awareness may pick up problems early, prevent significant medical complications, and possibly save your life. By practicing breast self-awareness, you can become familiar with how your breasts look and feel and if your breasts are changing. This allows you to notice changes early. It can also offer you some reassurance that your breast health is good. One way to learn what is normal for your breasts and whether your breasts are changing is to do a breast self-exam. If you find a lump or something that was not present in the past, it is best to contact your caregiver right away. Other findings that should be evaluated by your caregiver include nipple discharge, especially if it is bloody; skin changes or reddening; areas where the skin seems to be pulled in (retracted); or new lumps and bumps. Breast pain is seldom associated with cancer (malignancy), but should also be evaluated by a caregiver. HOW TO PERFORM A BREAST SELF-EXAM The best time to examine your breasts is 5-7 days after your menstrual period is over. During menstruation, the breasts are lumpier, and it may be more difficult to pick up changes. If you do not menstruate, have reached menopause, or had your uterus removed (hysterectomy), you should examine your breasts at regular intervals, such as monthly. If you are breastfeeding, examine your breasts after a feeding or after using a breast pump. Breast implants do not decrease the risk for lumps or tumors, so continue to perform breast self-exams as recommended. Talk to your caregiver about how to determine the difference between the implant and breast tissue. Also, talk about the amount of pressure you should use during the exam. Over time, you will become more familiar with the  variations of your breasts and more comfortable with the exam. A breast self-exam requires you to remove all your clothes above the waist. 1. Look at your breasts and nipples. Stand in front of a mirror in a room with good lighting. With your hands on your hips, push your hands firmly downward. Look for a difference in shape, contour, and size from one breast to the other (asymmetry). Asymmetry includes puckers, dips, or bumps. Also, look for skin changes, such as reddened or scaly areas on the breasts. Look for nipple changes, such as discharge, dimpling, repositioning, or redness. 2. Carefully feel your breasts. This is best done either in the shower or tub while using soapy water or when flat on your back. Place the arm (on the side of the breast you are examining) above your head. Use the pads (not the fingertips) of your three middle fingers on your opposite hand to feel your breasts. Start in the underarm area and use  inch (2 cm) overlapping circles to feel your breast. Use 3 different levels of pressure (light, medium, and firm pressure) at each circle before moving to the next circle. The light pressure is needed to feel the tissue closest to the skin. The medium pressure will help to feel breast tissue a little deeper, while the firm pressure is needed to feel the tissue close to the ribs. Continue the overlapping circles, moving downward over the breast until you feel your ribs below your breast. Then, move one finger-width towards the center of the body. Continue to use the  inch (2 cm) overlapping circles to feel  your breast as you move slowly up toward the collar bone (clavicle) near the base of the neck. Continue the up and down exam using all 3 pressures until you reach the middle of the chest. Do this with each breast, carefully feeling for lumps or changes. 3.  Keep a written record with breast changes or normal findings for each breast. By writing this information down, you do not need to  depend only on memory for size, tenderness, or location. Write down where you are in your menstrual cycle, if you are still menstruating. Breast tissue can have some lumps or thick tissue. However, see your caregiver if you find anything that concerns you.  SEEK MEDICAL CARE IF:  You see a change in shape, contour, or size of your breasts or nipples.   You see skin changes, such as reddened or scaly areas on the breasts or nipples.   You have an unusual discharge from your nipples.   You feel a new lump or unusually thick areas.  Document Released: 07/18/2005 Document Revised: 07/04/2012 Document Reviewed: 11/02/2011 [facility] Patient Information 2015 [place], [place]. This information is not intended to replace advice given to you by your health care provider. Make sure you discuss any questions you have with your health care provider.

## 2014-04-24 NOTE — Progress Notes (Signed)
OFFICE PROGRESS NOTE  CC  [name], [name], MD [place] [address] Dr. Excell Seltzer Dr. Gery Pray  DIAGNOSIS: 80 year old female with 0.48 cm left breast invasive ductal carcinoma grade 3 with associated high-grade DCIS. Patient is status post lumpectomy on 02/21/2011. Tumor was ER +99% PR +21% Ki-67 62% and HER-2/neu negative.  PRIOR THERAPY:   #1 patient underwent a lumpectomy on 02/21/2011 40.48 cm grade 3 invasive ductal carcinoma ER/PR positive HER-2/neu negative.  #2 she completed radiation therapy to the left breast on 05/21/2011.   #3 she was then begun on Arimidex 1 mg daily starting 06/03/2011. a total of 5 years of therapy is planned  #4 osteoporosis on Fosamax weekly.    CURRENT THERAPY: curative intent Arimidex 1 mg daily  INTERVAL HISTORY: Tammie [name] 81 y.o. female returns for Followup visit today of her left breast cancer.  She is taking Arimidex 68m every day.  She is taking Fosamax weekly.  She has not had any difficulty with hot flashes, vaginal dryness, joint aches or any further concerns.  She has also not noticed any difficulty with taking the Fosamax.  She did fall and fracture her hip in January, 2015.  She is recovering well.  She denies fevers, chills, night sweats, unintentional weight loss, new pain, bowel/bladder changes, or any further concerns.    MEDICAL HISTORY: Past Medical History  Diagnosis Date  . Breast lump     left  . Depression   . Hyperlipidemia   . Hypertension   . GERD (gastroesophageal reflux disease)   . Cancer     breast  . Breast cancer T1aN0 S/P lumpectomy SLN Bx 01/2011 03/25/2011  . Myocardial infarction 1999    mi  . Bilateral carotid artery stenosis     hx  . History of ETOH abuse     member AA 22.5 years  . Thyroid disease     hypo  . Lumbar herniated disc   . Fall from grocery cart Jan. 31, 2015    Fx  Right  Hip    ALLERGIES:  has No Known Allergies.  MEDICATIONS:   Current Outpatient Prescriptions  Medication Sig Dispense Refill  . acetaminophen (TYLENOL) 325 MG tablet Take 2 tablets (650 mg total) by mouth every 6 (six) hours as needed for mild pain (or Fever >/= 101).      .Marland Kitchenalendronate (FOSAMAX) 70 MG tablet Take 1 tablet (70 mg total) by mouth once a week. Take with a full glass of water on an empty stomach.  12 tablet  1  . amLODipine (NORVASC) 10 MG tablet Take 10 mg by mouth daily.        .Marland Kitchenanastrozole (ARIMIDEX) 1 MG tablet Take 1 tablet (1 mg total) by mouth daily.  90 tablet  12  . aspirin 325 MG EC tablet Take 325 mg by mouth daily.      . benazepril (LOTENSIN) 20 MG tablet Take 20 mg by mouth daily. Take 1 1/2 tab = 30 mg      . Calcium Carbonate (CALTRATE 600 PO) Take 1 tablet by mouth daily.       . Cholecalciferol (VITAMIN D-3) 1000 UNITS CAPS Take 1 capsule by mouth daily.       . clopidogrel (PLAVIX) 75 MG tablet Take 75 mg by mouth at bedtime.       .Marland KitchenFLUoxetine (PROZAC) 20 MG tablet Take 20 mg by mouth daily.        .[name]  furosemide (LASIX) 40 MG tablet Take 40 mg by mouth daily.       [name] levothyroxine (SYNTHROID, LEVOTHROID) 88 MCG tablet Take 88 mcg by mouth daily.      . Multiple Vitamins-Minerals (CENTRUM SILVER PO) Take by mouth daily.      . simvastatin (ZOCOR) 20 MG tablet Take 20 mg by mouth at bedtime.       [name] alum & mag hydroxide-simeth (MAALOX/MYLANTA) 200-200-20 MG/5ML suspension Take 30 mLs by mouth every 4 (four) hours as needed for indigestion.  355 mL  0  . aspirin EC 81 MG tablet Take 81 mg by mouth daily.      . bisacodyl (DULCOLAX) 10 MG suppository Place 1 suppository (10 mg total) rectally daily as needed for moderate constipation.  12 suppository  0  . docusate sodium 100 MG CAPS Take 100 mg by mouth 2 (two) times daily.  10 capsule  0  . enoxaparin (LOVENOX) 40 MG/0.4ML injection Inject 0.4 mLs (40 mg total) into the skin daily.  21 Syringe  0  . feeding supplement, RESOURCE BREEZE, (RESOURCE BREEZE) LIQD Take 1  Container by mouth 2 (two) times daily between meals.    0  . HYDROcodone-acetaminophen (NORCO) 5-325 MG per tablet Take 1-2 tablets by mouth every 6 (six) hours as needed. MAXIMUM TOTAL ACETAMINOPHEN DOSE IS 4000 MG PER DAY  30 tablet  0  . menthol-cetylpyridinium (CEPACOL) 3 MG lozenge Take 1 lozenge (3 mg total) by mouth as needed for sore throat (sore throat).  100 tablet  12  . sennosides-docusate sodium (SENOKOT-S) 8.6-50 MG tablet Take 2 tablets by mouth daily.  30 tablet  1   No current facility-administered medications for this visit.    SURGICAL HISTORY:  Past Surgical History  Procedure Laterality Date  . Breast lumpectomy Left 2012  . Breast biopsy  200, 88  . Breast biopsy  1985/2000    2 BENIGN LEFT BREAST BXS  . Femur im nail Right 09/01/2013    Procedure: INTRAMEDULLARY (IM) NAIL FEMORAL;  Surgeon: [name], MD;  Location: [facility];  Service: Orthopedics;  Laterality: Right;  . Fracture surgery Right Jan. 31, 2015    Hip    REVIEW OF SYSTEMS:    A 10 point review of systems was conducted and is otherwise negative except for what is noted above.    Health Maintenance  Mammogram: 12/26/2013 Colonoscopy: 2008 patient is not completely sure Bone Density Scan: 02/07/2013  Pap Smear: does not have anymore, last about 15 years ago Eye Exam: 10/2013 Vitamin D Level: unsure of when Lipid Panel:  2014   PHYSICAL EXAMINATION: BP 146/87  Pulse 55  Temp(Src) 98.2 F (36.8 C) (Oral)  Resp 18  Ht 5' 2"  (1.575 m)  Wt 116 lb 6.4 oz (52.799 kg)  BMI 21.28 kg/m2 GENERAL: Patient is a well appearing female in no acute distress HEENT:  Sclerae anicteric.  Oropharynx clear and moist. No ulcerations or evidence of oropharyngeal candidiasis. Neck is supple.  NODES:  No cervical, supraclavicular, or axillary lymphadenopathy palpated.  BREAST EXAM:  S/p left lumpectomy, no nodularity, no masses, right breast no nodules or masses, benign bilateral breast exam. LUNGS:  Clear to  auscultation bilaterally.  No wheezes or rhonchi. HEART:  Regular rate and rhythm. No murmur appreciated. ABDOMEN:  Soft, nontender.  Positive, normoactive bowel sounds. No organomegaly palpated. MSK:  No focal spinal tenderness to palpation. Full range of motion bilaterally in the upper extremities. EXTREMITIES:  No peripheral  edema.   SKIN:  Clear with no obvious rashes or skin changes. No nail dyscrasia. NEURO:  Nonfocal. Well oriented.  Appropriate affect. ECOG PERFORMANCE STATUS: 1 - Symptomatic but completely ambulatory    LABORATORY DATA: Lab Results  Component Value Date   WBC 7.7 04/24/2014   HGB 13.2 04/24/2014   HCT 40.5 04/24/2014   MCV 92.6 04/24/2014   PLT 211 04/24/2014      Chemistry      Component Value Date/Time   NA 141 04/24/2014 0814   NA 140 09/03/2013 0600   K 3.9 04/24/2014 0814   K 3.9 09/03/2013 0600   CL 105 09/03/2013 0600   CL 103 01/18/2013 0918   CO2 27 04/24/2014 0814   CO2 22 09/03/2013 0600   BUN 18.3 04/24/2014 0814   BUN 17 09/03/2013 0600   CREATININE 1.2* 04/24/2014 0814   CREATININE 0.90 09/03/2013 0600      Component Value Date/Time   CALCIUM 10.4 04/24/2014 0814   CALCIUM 8.3* 09/03/2013 0600   ALKPHOS 79 04/24/2014 0814   ALKPHOS 66 01/13/2012 1038   AST 19 04/24/2014 0814   AST 22 01/13/2012 1038   ALT 14 04/24/2014 0814   ALT 24 01/13/2012 1038   BILITOT 0.45 04/24/2014 0814   BILITOT 0.4 01/13/2012 1038       RADIOGRAPHIC STUDIES:  Mm Digital Diagnostic Bilat  12/23/2011  *RADIOLOGY REPORT*  Clinical Data:  Left lumpectomy for breast cancer in 2012.  Initial post lumpectomy exam.  DIGITAL DIAGNOSTIC BILATERAL MAMMOGRAM Mammographic images were processed with CAD.  Comparison:  Prior exams  Findings:  Left lumpectomy changes are noted. The breast parenchyma demonstrates scattered fibroglandular densities.  No suspicious mass, calcification, or architectural distortion is seen.  IMPRESSION: No evidence for malignancy.  Expected left lumpectomy changes.  Diagnostic mammography is recommended in 1 year. Findings and recommendations discussed with the patient and provided in written form at the time of the exam.  BI-RADS CATEGORY 2:  Benign finding(s).  Original Report Authenticated By: Arline Asp, M.D.    ASSESSMENT: 80 year old female with  #1 stage I  invasive ductal carcinoma with associated DCIS. Originally diagnosed in July 2012. Patient is status post lumpectomy followed by radiation therapy which he completed on 05/21/2011. She was then begun on Arimidex 1 mg daily beginning November 2012. Overall she is doing well and she is without any evidence of disease or any other complaints.  #2 Osteoporosis: Pt s/p hip fracture in 08/2013   PLAN:  Tammie [name] is doing well today.  She has no sign of recurrence.  She is taking Arimidex daily and will continue this.  She is tolerating this well.    We updated her above health maintenance and she is up to date.  I recommended healthy diet exercise and monthly breast exams.    Tammie [name] will continue to take Fosamax weekly.  She is tolerating it well.    All questions were answered. The patient knows to call the clinic with any problems, questions or concerns. We can certainly see the patient much sooner if necessary.  I spent 25 minutes counseling the patient face to face. The total time spent in the appointment was 30 minutes.   Minette Headland, [place] [phone]   04/24/2014, 9:13 AM

## 2014-06-02 ENCOUNTER — Encounter: Payer: Self-pay | Admitting: Adult Health

## 2014-07-01 ENCOUNTER — Other Ambulatory Visit: Payer: Self-pay | Admitting: Family Medicine

## 2014-07-01 DIAGNOSIS — I6523 Occlusion and stenosis of bilateral carotid arteries: Secondary | ICD-10-CM

## 2014-07-18 ENCOUNTER — Ambulatory Visit
Admission: RE | Admit: 2014-07-18 | Discharge: 2014-07-18 | Disposition: A | Discharge status: Home / Self Care | Payer: Medicare Other | Source: Ambulatory Visit | Attending: Family Medicine | Admitting: Family Medicine

## 2014-07-18 DIAGNOSIS — I6523 Occlusion and stenosis of bilateral carotid arteries: Secondary | ICD-10-CM

## 2014-11-27 ENCOUNTER — Other Ambulatory Visit (HOSPITAL_COMMUNITY): Payer: Medicare Other

## 2014-11-27 ENCOUNTER — Ambulatory Visit: Payer: Medicare Other | Admitting: Family

## 2014-12-04 ENCOUNTER — Other Ambulatory Visit: Payer: Self-pay

## 2014-12-04 DIAGNOSIS — Z1231 Encounter for screening mammogram for malignant neoplasm of breast: Secondary | ICD-10-CM

## 2014-12-25 ENCOUNTER — Other Ambulatory Visit: Payer: Self-pay | Admitting: Family Medicine

## 2014-12-25 ENCOUNTER — Other Ambulatory Visit: Payer: Self-pay

## 2014-12-25 DIAGNOSIS — Z9889 Other specified postprocedural states: Secondary | ICD-10-CM

## 2014-12-30 ENCOUNTER — Other Ambulatory Visit: Payer: Self-pay | Admitting: Family Medicine

## 2014-12-30 ENCOUNTER — Ambulatory Visit
Admission: RE | Admit: 2014-12-30 | Discharge: 2014-12-30 | Disposition: A | Discharge status: Home / Self Care | Payer: Medicare Other | Source: Ambulatory Visit

## 2014-12-30 ENCOUNTER — Ambulatory Visit
Admission: RE | Admit: 2014-12-30 | Discharge: 2014-12-30 | Disposition: A | Discharge status: Home / Self Care | Payer: Medicare Other | Source: Ambulatory Visit | Attending: Family Medicine | Admitting: Family Medicine

## 2014-12-30 DIAGNOSIS — N6320 Unspecified lump in the left breast, unspecified quadrant: Secondary | ICD-10-CM

## 2014-12-30 DIAGNOSIS — Z9889 Other specified postprocedural states: Secondary | ICD-10-CM

## 2015-01-01 ENCOUNTER — Encounter: Payer: Self-pay | Admitting: *Deleted

## 2015-01-01 ENCOUNTER — Telehealth: Payer: Self-pay | Admitting: *Deleted

## 2015-01-01 ENCOUNTER — Other Ambulatory Visit: Payer: Self-pay | Admitting: Family Medicine

## 2015-01-01 DIAGNOSIS — N6320 Unspecified lump in the left breast, unspecified quadrant: Secondary | ICD-10-CM

## 2015-01-01 NOTE — Telephone Encounter (Signed)
[place] ([place]) re: stopping Plavix and ASA 325 mg per patient phone call to me yesterday. Dr. Chapman Fitch has ordered at Breast biopsy to be done on 01-05-15 and was questioning whether patient should be off Plavix for this procedure. I spoke to [facility] and she says that they want her off Plavix x 5 days and off ASA 325 x 24 hours. I discussed this with Dr. Scot Dock and he has approved this, I faxed a letter to [facility] at [phone].

## 2015-01-05 ENCOUNTER — Ambulatory Visit
Admission: RE | Admit: 2015-01-05 | Discharge: 2015-01-05 | Disposition: A | Discharge status: Home / Self Care | Payer: Medicare Other | Source: Ambulatory Visit | Attending: Family Medicine | Admitting: Family Medicine

## 2015-01-05 ENCOUNTER — Other Ambulatory Visit: Payer: Self-pay | Admitting: Family Medicine

## 2015-01-05 DIAGNOSIS — N6320 Unspecified lump in the left breast, unspecified quadrant: Secondary | ICD-10-CM

## 2015-01-06 ENCOUNTER — Ambulatory Visit
Admission: RE | Admit: 2015-01-06 | Discharge: 2015-01-06 | Disposition: A | Discharge status: Home / Self Care | Payer: Medicare Other | Source: Ambulatory Visit | Attending: Family Medicine | Admitting: Family Medicine

## 2015-01-06 DIAGNOSIS — N6320 Unspecified lump in the left breast, unspecified quadrant: Secondary | ICD-10-CM

## 2015-01-26 ENCOUNTER — Other Ambulatory Visit: Payer: Self-pay

## 2015-01-26 DIAGNOSIS — I6529 Occlusion and stenosis of unspecified carotid artery: Secondary | ICD-10-CM

## 2015-02-19 ENCOUNTER — Encounter: Payer: Medicare Other | Attending: Family Medicine | Admitting: Skilled Nursing Facility1

## 2015-02-19 ENCOUNTER — Encounter: Payer: Self-pay | Admitting: Skilled Nursing Facility1

## 2015-02-19 VITALS — Ht 62.0 in | Wt 113.0 lb

## 2015-02-19 DIAGNOSIS — Z713 Dietary counseling and surveillance: Secondary | ICD-10-CM | POA: Diagnosis not present

## 2015-02-19 DIAGNOSIS — R7309 Other abnormal glucose: Secondary | ICD-10-CM | POA: Diagnosis not present

## 2015-02-19 DIAGNOSIS — R7303 Prediabetes: Secondary | ICD-10-CM

## 2015-02-19 NOTE — Patient Instructions (Signed)
-  Try to decrease your soda to one or two 8 ounce glasses -Try to increase the amount of water you drink -Watch your deli meat because of the sodium and try low sodium crackers -Start drinking milk again -Try nuts in your cereal -Eat protein with your carbohydrates (example: crackers and cheese)

## 2015-02-19 NOTE — Progress Notes (Signed)
  Medical Nutrition Therapy:  Appt start time: 9:15 end time:  10:15   Assessment:  Primary concerns today: referred for prediabetes. Pt states she has some questions about meal planning with pre-diabetes. Pt has received the "Living well with diabetes" book from her physician and has some questions about it. Pt is very talkative and takes care of herself.  Pt states she is very healthy and feels good. Pt states she sleeps well every night. Pt states she lives at home alone and her child hood friend from [place] lives next door to her. Pt states she grocery shops and cooks for herself. Pt states she has been in [place] for 27 years. 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 Pt states she Loves her soap operas.  Pts A1C 6.  Preferred Learning Style:   No preference indicated   Learning Readiness:  Ready   MEDICATIONS: See List   DIETARY INTAKE: Usual eating pattern includes 3 meals and 2 snacks per day.  Everyday foods include none stated.  Avoided foods include none stated.    24-hr recall:  B ( AM): muffin,  Snk ( AM):  fruit on cereal L ( PM):chicken sandwhich Snk ( PM): [place] D ( PM): sweet potatoes, vegetables, meat Snk ( PM): strawberry jam on crackers Beverages: coffee, water, diet soda  Usual physical activity: ADL's, every day therapy exercises  Estimated energy needs: 1600 calories 180 g carbohydrates 120 g protein 44 g fat  Progress Towards Goal(s):  In progress.   Nutritional Diagnosis:  NB-1.1 Food and nutrition-related knowledge deficit As related to no prior nutrition education from a nutrition professional.  As evidenced by pt report.    Intervention:  Nutrition counseling for pre-diabetes. Dieititian educated the pt on what a carbohydrate is, the definition of prediabetes and A1C, the importance of keeping her mind/body active at her age, and a  varied/balanced diet.  Goals: -Try to decrease your soda to one or two 8 ounce glasses -Try to increase the amount of water you drink -Watch your deli meat because of the sodium and try low sodium crackers -Start drinking milk again -Try nuts in your cereal -Eat protein with your carbohydrates (example: crackers and cheese)   Teaching Method Utilized:  Visual Auditory Hands on  Handouts given during visit include:  Snack sheet  1 day meal plan  Barriers to learning/adherence to lifestyle change: advanced age  Demonstrated degree of understanding via:  Teach Back   Monitoring/Evaluation:  Dietary intake, A1C prn.

## 2015-02-23 ENCOUNTER — Encounter: Payer: Self-pay | Admitting: Vascular Surgery

## 2015-02-26 ENCOUNTER — Encounter (INDEPENDENT_AMBULATORY_CARE_PROVIDER_SITE_OTHER): Payer: Self-pay

## 2015-02-26 ENCOUNTER — Ambulatory Visit (HOSPITAL_COMMUNITY)
Admission: RE | Admit: 2015-02-26 | Discharge: 2015-02-26 | Disposition: A | Discharge status: Home / Self Care | Payer: Medicare Other | Source: Ambulatory Visit | Attending: Vascular Surgery | Admitting: Vascular Surgery

## 2015-02-26 ENCOUNTER — Encounter: Payer: Self-pay | Admitting: Vascular Surgery

## 2015-02-26 ENCOUNTER — Ambulatory Visit (INDEPENDENT_AMBULATORY_CARE_PROVIDER_SITE_OTHER): Payer: Medicare Other | Admitting: Vascular Surgery

## 2015-02-26 VITALS — BP 150/36 | HR 56 | Temp 98.3°F | Resp 16 | Ht 62.0 in | Wt 112.8 lb

## 2015-02-26 DIAGNOSIS — I6523 Occlusion and stenosis of bilateral carotid arteries: Secondary | ICD-10-CM

## 2015-02-26 NOTE — Progress Notes (Signed)
[facility] HISTORY AND PHYSICAL   History of Present Illness:  Patient is a 81 y.o. year old female who presents for follow-up evaluation for carotid stenosis. She is on Aspirin for antiplatelet therapy.  Her atherosclerotic risk factors remain elevated cholesterol, hypertension, remote smoking history (quit 2006), and coronary artery disease.  These are all currently stable and followed by her primary care physician.  She denies any new neurologic events including amaurosis, numbness, or weakness.  Past Medical History  Diagnosis Date  . Breast lump     left  . Depression   . Hyperlipidemia   . Hypertension   . GERD (gastroesophageal reflux disease)   . Cancer     breast  . Breast cancer T1aN0 S/P lumpectomy SLN Bx 01/2011 03/25/2011  . Myocardial infarction 1999    mi  . Bilateral carotid artery stenosis     hx  . History of ETOH abuse     member AA 22.5 years  . Thyroid disease     hypo  . Lumbar herniated disc   . Fall from grocery cart Jan. 31, 2015    Fx  Right  Hip    Past Surgical History  Procedure Laterality Date  . Breast lumpectomy Left 2012  . Breast biopsy  200, 88  . Breast biopsy  1985/2000    2 BENIGN LEFT BREAST BXS  . Femur im nail Right 09/01/2013    Procedure: INTRAMEDULLARY (IM) NAIL FEMORAL;  Surgeon: [name], MD;  Location: [facility];  Service: Orthopedics;  Laterality: Right;  . Fracture surgery Right Jan. 31, 2015    Hip    Review of Systems:  Neurologic: as above Cardiac:denies shortness of breath or chest pain Pulmonary: denies cough or wheeze  Social History History  Substance Use Topics  . Smoking status: Former Smoker    Types: Cigarettes    Quit date: 09/29/2004  . Smokeless tobacco: Never Used     Comment: 2007- quit  . Alcohol Use: No    Allergies  No Known Allergies   Current Outpatient Prescriptions  Medication Sig Dispense Refill  . acetaminophen (TYLENOL) 325 MG tablet Take 2 tablets  (650 mg total) by mouth every 6 (six) hours as needed for mild pain (or Fever >/= 101).    [name] alendronate (FOSAMAX) 70 MG tablet Take 1 tablet (70 mg total) by mouth once a week. Take with a full glass of water on an empty stomach. 12 tablet 1  . alum & mag hydroxide-simeth (MAALOX/MYLANTA) 200-200-20 MG/5ML suspension Take 30 mLs by mouth every 4 (four) hours as needed for indigestion. 355 mL 0  . amLODipine (NORVASC) 10 MG tablet Take 10 mg by mouth daily.      [name] anastrozole (ARIMIDEX) 1 MG tablet Take 1 tablet (1 mg total) by mouth daily. 90 tablet 12  . aspirin 325 MG EC tablet Take 325 mg by mouth daily.    . benazepril (LOTENSIN) 20 MG tablet Take 20 mg by mouth daily. Take 1 1/2 tab = 30 mg    . bisacodyl (DULCOLAX) 10 MG suppository Place 1 suppository (10 mg total) rectally daily as needed for moderate constipation. 12 suppository 0  . Calcium Carbonate (CALTRATE 600 PO) Take 1 tablet by mouth daily.     . Cholecalciferol (VITAMIN D-3) 1000 UNITS CAPS Take 1 capsule by mouth daily.     . clopidogrel (PLAVIX) 75 MG tablet Take 75 mg by mouth at bedtime.     [name]  docusate sodium 100 MG CAPS Take 100 mg by mouth 2 (two) times daily. 10 capsule 0  . enoxaparin (LOVENOX) 40 MG/0.4ML injection Inject 0.4 mLs (40 mg total) into the skin daily. 21 Syringe 0  . feeding supplement, RESOURCE BREEZE, (RESOURCE BREEZE) LIQD Take 1 Container by mouth 2 (two) times daily between meals.  0  . FLUoxetine (PROZAC) 20 MG tablet Take 20 mg by mouth daily.      . furosemide (LASIX) 40 MG tablet Take 40 mg by mouth daily.     [name] levothyroxine (SYNTHROID, LEVOTHROID) 88 MCG tablet Take 88 mcg by mouth daily.    [name] menthol-cetylpyridinium (CEPACOL) 3 MG lozenge Take 1 lozenge (3 mg total) by mouth as needed for sore throat (sore throat). 100 tablet 12  . Multiple Vitamins-Minerals (CENTRUM SILVER PO) Take by mouth daily.    . sennosides-docusate sodium (SENOKOT-S) 8.6-50 MG tablet Take 2 tablets by mouth daily. 30  tablet 1  . simvastatin (ZOCOR) 20 MG tablet Take 20 mg by mouth at bedtime.      No current facility-administered medications for this visit.    Physical Examination  Filed Vitals:   02/26/15 0933 02/26/15 0937  BP: 163/41 150/36  Pulse: 52 56  Temp: 98.3 F (36.8 C)   TempSrc: Oral   Resp: 16   Height: 5\' 2"  (1.575 m)   Weight: 112 lb 12.8 oz (51.166 kg)   SpO2: 100%     Body mass index is 20.63 kg/(m^2).  General:  Alert and oriented, no acute distress HEENT: Normal Neck: No bruit or JVD Pulmonary: Clear to auscultation bilaterally Cardiac: Regular Rate and Rhythm without murmur Neurologic: Upper and lower extremity motor 5/5 and symmetric  DATA: I reviewed the patient's carotid duplex scan from 11/27/2013. She has not had a scan since that time.   ASSESSMENT: Asymptomatic mild to moderate carotid stenosis no significant change over the last 2 years   PLAN:  Patient is due for carotid duplex exam at this time. We will get this scheduled for her with next couple of weeks. If this remains unchanged she will follow up again in 1 year. She will continue her aspirin.  Ruta Hinds, MD [facility] Office: [phone] Pager: [phone]

## 2015-03-30 ENCOUNTER — Encounter: Payer: Self-pay | Admitting: Neurology

## 2015-03-30 ENCOUNTER — Ambulatory Visit (INDEPENDENT_AMBULATORY_CARE_PROVIDER_SITE_OTHER): Payer: Medicare Other | Admitting: Neurology

## 2015-03-30 ENCOUNTER — Other Ambulatory Visit (INDEPENDENT_AMBULATORY_CARE_PROVIDER_SITE_OTHER): Payer: Medicare Other

## 2015-03-30 VITALS — BP 112/52 | HR 56 | Ht 62.0 in | Wt 112.0 lb

## 2015-03-30 DIAGNOSIS — I6522 Occlusion and stenosis of left carotid artery: Secondary | ICD-10-CM

## 2015-03-30 DIAGNOSIS — M542 Cervicalgia: Secondary | ICD-10-CM

## 2015-03-30 DIAGNOSIS — R7309 Other abnormal glucose: Secondary | ICD-10-CM | POA: Diagnosis not present

## 2015-03-30 DIAGNOSIS — R7303 Prediabetes: Secondary | ICD-10-CM

## 2015-03-30 DIAGNOSIS — R2681 Unsteadiness on feet: Secondary | ICD-10-CM

## 2015-03-30 DIAGNOSIS — R292 Abnormal reflex: Secondary | ICD-10-CM | POA: Diagnosis not present

## 2015-03-30 DIAGNOSIS — G609 Hereditary and idiopathic neuropathy, unspecified: Secondary | ICD-10-CM

## 2015-03-30 LAB — HEMOGLOBIN A1C: Hgb A1c MFr Bld: 5.9 % (ref 4.6–6.5)

## 2015-03-30 LAB — FOLATE: Folate: >24.8 ng/mL (ref >5.9)

## 2015-03-30 LAB — VITAMIN B12: Vitamin B-12: 973 pg/mL — ABNORMAL HIGH (ref 211–911)

## 2015-03-30 NOTE — Progress Notes (Signed)
Note routed to Dr Chapman Fitch.

## 2015-03-30 NOTE — Progress Notes (Signed)
Tammie [name] was seen today in neurologic consultation at the request of [name], [name], MD.  The consultation is for the evaluation of loss of balance.  The records that were made available to me were reviewed.  She has noted issues with balance for the last 4-5 months.  Although she states that balance issues have only been going on for 4-5 months ago, she admits that she fell 1.5 years ago at the grocery store for no known reason and fx the right hip and had to have surgery.  However, she thinks that balance has been worse over the last 4-5 months.   She has noted that she has had to start sitting in the first pew at church so that it is easier for her to take communion and now has communion brought to her rather than walking up to get it.  Pt is not diabetic but states that she was told that she was pre-diabetic and has recently been watching her diet more closely after seeing a nutritionist.  Her last glucose from Dr. Wolfgang Phoenix office was 2.  Pt does have a hx of EtOHism but quit drinking years ago and is still actively involved in [place].  She states that ever since her hip fx she has had a short leg and has had to have a lift in the shoe.  No falls in the last year.  She denies paresthesias of the feet initially and then states, "well, sometimes, maybe."  She admits to some dizzy episodes but that does not seem to be the same as the issues with loss of balance.   ALLERGIES:  No Known Allergies  CURRENT MEDICATIONS:  Outpatient Encounter Prescriptions as of 03/30/2015  Medication Sig  . acetaminophen (TYLENOL) 325 MG tablet Take 2 tablets (650 mg total) by mouth every 6 (six) hours as needed for mild pain (or Fever >/= 101).  [name] alendronate (FOSAMAX) 70 MG tablet Take 1 tablet (70 mg total) by mouth once a week. Take with a full glass of water on an empty stomach.  [name] amLODipine (NORVASC) 10 MG tablet Take 10 mg by mouth daily.    [name] anastrozole (ARIMIDEX) 1 MG tablet Take 1 tablet (1 mg total) by mouth  daily.  [name] aspirin 325 MG EC tablet Take 325 mg by mouth daily.  . benazepril (LOTENSIN) 20 MG tablet Take 20 mg by mouth daily. Take 1 1/2 tab = 30 mg  . Cholecalciferol (VITAMIN D-3) 1000 UNITS CAPS Take 1 capsule by mouth daily.   . clopidogrel (PLAVIX) 75 MG tablet Take 75 mg by mouth at bedtime.   [name] FLUoxetine (PROZAC) 20 MG tablet Take 20 mg by mouth daily.    . furosemide (LASIX) 40 MG tablet Take 40 mg by mouth daily.   [name] levothyroxine (SYNTHROID, LEVOTHROID) 88 MCG tablet Take 88 mcg by mouth daily.  . Multiple Vitamins-Minerals (CENTRUM SILVER PO) Take by mouth daily.  . simvastatin (ZOCOR) 20 MG tablet Take 20 mg by mouth at bedtime.   . [DISCONTINUED] alum & mag hydroxide-simeth (MAALOX/MYLANTA) 200-200-20 MG/5ML suspension Take 30 mLs by mouth every 4 (four) hours as needed for indigestion.  . [DISCONTINUED] bisacodyl (DULCOLAX) 10 MG suppository Place 1 suppository (10 mg total) rectally daily as needed for moderate constipation.  . [DISCONTINUED] Calcium Carbonate (CALTRATE 600 PO) Take 1 tablet by mouth daily.   . [DISCONTINUED] docusate sodium 100 MG CAPS Take 100 mg by mouth 2 (two) times daily.  . [DISCONTINUED] enoxaparin (LOVENOX) 40  MG/0.4ML injection Inject 0.4 mLs (40 mg total) into the skin daily.  . [DISCONTINUED] feeding supplement, RESOURCE BREEZE, (RESOURCE BREEZE) LIQD Take 1 Container by mouth 2 (two) times daily between meals.  . [DISCONTINUED] menthol-cetylpyridinium (CEPACOL) 3 MG lozenge Take 1 lozenge (3 mg total) by mouth as needed for sore throat (sore throat).  . [DISCONTINUED] sennosides-docusate sodium (SENOKOT-S) 8.6-50 MG tablet Take 2 tablets by mouth daily.   No facility-administered encounter medications on file as of 03/30/2015.    PAST MEDICAL HISTORY:   Past Medical History  Diagnosis Date  . Breast lump     left, benign  . Depression   . Hyperlipidemia   . Hypertension   . GERD (gastroesophageal reflux disease)   . Cancer     breast  .  Breast cancer T1aN0 S/P lumpectomy SLN Bx 01/2011 03/25/2011  . Myocardial infarction 1999    mi  . Bilateral carotid artery stenosis     hx  . History of ETOH abuse     member AA 22.5 years  . Thyroid disease     hypo  . Lumbar herniated disc   . Fall from grocery cart Jan. 31, 2015    Fx  Right  Hip    PAST SURGICAL HISTORY:   Past Surgical History  Procedure Laterality Date  . Breast lumpectomy Left 2012    bening  . Breast biopsy  200, 88  . Breast biopsy  1985/2000    2 BENIGN LEFT BREAST BXS  . Femur im nail Right 09/01/2013    Procedure: INTRAMEDULLARY (IM) NAIL FEMORAL;  Surgeon: [name], MD;  Location: [facility];  Service: Orthopedics;  Laterality: Right;  . Fracture surgery Right Jan. 31, 2015    Hip    SOCIAL HISTORY:   Social History   Social History  . Marital Status: Single    Spouse Name: N/A  . Number of Children: N/A  . Years of Education: N/A   Occupational History  . RETIRED [employer]   Social History Main Topics  . Smoking status: Former Smoker    Types: Cigarettes    Quit date: 09/29/2004  . Smokeless tobacco: Never Used     Comment: 2007- quit  . Alcohol Use: No     Comment: hx of EtOHism - in [place] x 27 years  . Drug Use: No  . Sexual Activity: Not Currently    Birth Control/ Protection: Post-menopausal     Comment: NO HRT/NO B.C. PILLS   Other Topics Concern  . Not on file   Social History Narrative    FAMILY HISTORY:   Family Status  Relation Status Death Age  . Mother Deceased     MI  . Father Deceased     MI    ROS:  There is some neck pain.  A complete 10 system review of systems was obtained and was unremarkable apart from what is mentioned above.  PHYSICAL EXAMINATION:    VITALS:   Filed Vitals:   03/30/15 0913  BP: 112/52  Pulse: 56  Height: 5\' 2"  (1.575 m)  Weight: 112 lb (50.803 kg)    GEN:  Normal appears female in no acute distress.  Appears stated age. HEENT:  Normocephalic, atraumatic. The mucous  membranes are moist. The superficial temporal arteries are without ropiness or tenderness. Cardiovascular: Regular rate and rhythm. Lungs: Clear to auscultation bilaterally. Neck/Heme: There are bilateral carotid bruits, L louder than right  NEUROLOGICAL: Orientation:  The patient  is alert and oriented x 3.  Fund of knowledge is appropriate.  Recent and remote memory intact.  Attention span and concentration normal.  Repeats and names without difficulty. Cranial nerves: There is good facial symmetry. The pupils are equal round and reactive to light bilaterally. Fundoscopic exam reveals clear disc margins bilaterally. Extraocular muscles are intact and visual fields are full to confrontational testing. Speech is fluent and clear. Soft palate rises symmetrically and there is no tongue deviation. Hearing is intact to conversational tone. Tone: Tone is good throughout. Sensation: Sensation is intact to light touch and pinprick throughout (facial, trunk, extremities). Pinprick is not decreased in a stocking distribution.  Vibration is decreased at the bilateral big toe. There is no extinction with double simultaneous stimulation. There is no sensory dermatomal level identified. Coordination:  The patient has no difficulty with RAM's or FNF bilaterally. Motor: Strength is 5/5 in the bilateral upper and lower extremities.  Shoulder shrug is equal and symmetric. There is no pronator drift.  There are no fasciculations noted. DTR's: Deep tendon reflexes are 3/4 at the bilateral biceps, triceps, brachioradialis, patella and achilles. There are cross adductor reflexes.  There are no pectoralis reflexes.  Plantar responses are downgoing bilaterally. Gait and Station: The patient is able to ambulate without difficulty.  She is quite wide based.  She is unable to ambulate in a tandem fashion.  She has difficulty standing in the Romberg position even with eyes open and sways with eyes closed, but is able to do it.      IMPRESSION/PLAN   1. Gait instability  -may be multifactorial.  Definitely has evidence of peripheral neuropathy.  She is a risk for this due to remote hx of EtOH abuse (now in recovery) but is also significantly hyperreflexic (unusual in neuropathy).  Because she c/o neck pain, we should make sure that she doesn't have a co-existing neck/disc issue.  We will order an MRI of the cervical spine  -we will do labs to r/o reversible causes of PN.  Labs ordered:  B12, Folate, RPR, SPEP and UPEP with immunofixation, HgbA1C (pre-diabetic per pt)  -we will do an EMG  -depending on the results of the above, may need to put her back in balance therapy.  She did this several years ago and it helped  -Safety was discussed.  Greater than 50% of the 60 minute visit in counseling.  2.  Carotid stenosis  -She is having this followed with Dr. Oneida Alar and has another f/u in 6 months (does u/s every 6 months).  There is evidence of 40-59% stenosis on the left with her carotid ultrasound just completed on 02/26/2015.  3.  Further recommendations will follow the above testing.

## 2015-03-30 NOTE — Patient Instructions (Addendum)
1.  We will be doing testing for peripheral neuropathy and will be doing some testing as well to look at your cervical spine (MRI) to see if there is any problem there to account for your loss of balance.   2. Your provider has requested that you have labwork completed today. Please go to [facility] on the second floor of this building before leaving the office today. You do not need to check in. If you are not called within 15 minutes please check with the front desk.  3. We have scheduled you at [facility] for your MRI on 04/03/15 at 8:00 am. Please arrive 15 minutes prior and go to 1st floor radiology. If you need to reschedule for any reason please call [phone].

## 2015-03-31 LAB — RPR

## 2015-04-01 LAB — SPEP & IFE WITH QIG
Abnormal Protein Band1: 0.8 g/dL
Albumin ELP: 4.8 g/dL (ref 3.8–4.8)
Alpha-1-Globulin: 0.3 g/dL (ref 0.2–0.3)
Alpha-2-Globulin: 0.8 g/dL (ref 0.5–0.9)
Beta 2: 0.3 g/dL (ref 0.2–0.5)
Beta Globulin: 0.4 g/dL (ref 0.4–0.6)
Gamma Globulin: 1.5 g/dL (ref 0.8–1.7)
IGA: 104 mg/dL (ref 69–380)
IGM, SERUM: 106 mg/dL (ref 52–322)
IgG (Immunoglobin G), Serum: 1620 mg/dL (ref 690–1700)
Total Protein, Serum Electrophoresis: 8.1 g/dL (ref 6.1–8.1)

## 2015-04-01 LAB — UIFE/LIGHT CHAINS/TP QN, 24-HR UR
Albumin, U: DETECTED
Total Protein, Urine: <4 mg/dL — ABNORMAL LOW (ref 5–24)

## 2015-04-02 ENCOUNTER — Telehealth: Payer: Self-pay | Admitting: Neurology

## 2015-04-02 DIAGNOSIS — D472 Monoclonal gammopathy: Secondary | ICD-10-CM

## 2015-04-02 NOTE — Telephone Encounter (Signed)
-----   Message from [place], DO sent at 04/02/2015  8:12 AM EDT ----- Tammie [name], let pt know that I want her to see hematologist to look at a protein in her blood (dx: MGUS).

## 2015-04-02 NOTE — Telephone Encounter (Signed)
Patient made aware. Referral placed to hematology.

## 2015-04-03 ENCOUNTER — Ambulatory Visit (HOSPITAL_COMMUNITY)
Admission: RE | Admit: 2015-04-03 | Discharge: 2015-04-03 | Disposition: A | Discharge status: Home / Self Care | Payer: Medicare Other | Source: Ambulatory Visit | Attending: Neurology | Admitting: Neurology

## 2015-04-03 ENCOUNTER — Telehealth: Payer: Self-pay | Admitting: Neurology

## 2015-04-03 DIAGNOSIS — M5031 Other cervical disc degeneration,  high cervical region: Secondary | ICD-10-CM | POA: Insufficient documentation

## 2015-04-03 DIAGNOSIS — M542 Cervicalgia: Secondary | ICD-10-CM

## 2015-04-03 DIAGNOSIS — R292 Abnormal reflex: Secondary | ICD-10-CM

## 2015-04-03 DIAGNOSIS — M4802 Spinal stenosis, cervical region: Secondary | ICD-10-CM | POA: Insufficient documentation

## 2015-04-03 DIAGNOSIS — G609 Hereditary and idiopathic neuropathy, unspecified: Secondary | ICD-10-CM

## 2015-04-03 NOTE — Telephone Encounter (Signed)
-----   Message from [place], DO sent at 04/03/2015 12:50 PM EDT ----- Very poor quality MRI due to motion artifact.  C6-7 disc protrusion with mild central canal stenosis.  Jade let pt know that she has diffuse advanced degenerative changes (arthritic) and one level with a disc protrusion.  These things could account for her neck pain but don't think that surgery will help any arthritis because is so diffuse.  She is welcome to get opinion if she wants

## 2015-04-03 NOTE — Telephone Encounter (Signed)
Patient made aware of results. She does not want a neurosurgery opinion at this point.

## 2015-04-09 ENCOUNTER — Telehealth: Payer: Self-pay

## 2015-04-09 NOTE — Telephone Encounter (Signed)
LMOVM - pt to come in for appt.  Pt to return call to clinic.  Appointments available 9/9 if pt can come in,.

## 2015-04-10 ENCOUNTER — Encounter: Payer: Self-pay | Admitting: Hematology and Oncology

## 2015-04-10 ENCOUNTER — Ambulatory Visit (HOSPITAL_BASED_OUTPATIENT_CLINIC_OR_DEPARTMENT_OTHER): Payer: Medicare Other | Admitting: Hematology and Oncology

## 2015-04-10 ENCOUNTER — Telehealth: Payer: Self-pay | Admitting: Hematology and Oncology

## 2015-04-10 VITALS — BP 172/48 | HR 51 | Temp 97.9°F | Resp 18 | Ht 62.0 in | Wt 118.8 lb

## 2015-04-10 DIAGNOSIS — D472 Monoclonal gammopathy: Secondary | ICD-10-CM

## 2015-04-10 DIAGNOSIS — Z853 Personal history of malignant neoplasm of breast: Secondary | ICD-10-CM

## 2015-04-10 DIAGNOSIS — C50912 Malignant neoplasm of unspecified site of left female breast: Secondary | ICD-10-CM

## 2015-04-10 NOTE — Telephone Encounter (Signed)
Appointments made and avs printed for patient °

## 2015-04-10 NOTE — Addendum Note (Signed)
Addended by: Prentiss Bells on: 04/10/2015 11:49 AM   Modules accepted: Medications

## 2015-04-10 NOTE — Telephone Encounter (Signed)
S/w patient and gave appt for 09/09 @ 10:30 w/Dr. Lindi Adie

## 2015-04-10 NOTE — Assessment & Plan Note (Signed)
MGUS IgG Kappa: 0.7 g detected August 2016 as part of the workup for neck pain with neuropathy. There is no evidence of anemia or hypercalcemia, mild renal dysfunction is unlikely to be related to monoclonal protein. Based on lack of evidence of myeloma, I believe this is MGUS.  Recommendation: Monitor monoclonal protein along with CBC and CMP annually. I discussed with her in great detail the significance of this monoclonal protein and the risk of transformation to myeloma.  Return to clinic in 1 year with labs one week prior to the appointment.

## 2015-04-10 NOTE — Assessment & Plan Note (Signed)
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 Breast exam 04/10/2015: Normal 2. Mammogram 12/30/2014: Architectural distortion left breast 1:00 position 9 x 7 x 7 mm no lymphadenopathy, ultrasound-guided biopsy 01/06/2015 showed benign breast tissue. 3. MRI of the cervical spine 04/03/2015:no evidence of metastatic disease. But advanced multilevel cervical disc and facet degeneration with mild spinal stenosis at C6-C7 and mild foraminal stenosis (R though did not think surgery is necessary)  Return to clinic in 1 year for follow-up

## 2015-04-10 NOTE — Progress Notes (Signed)
Patient Care Team: Antony Blackbird, MD as PCP - General (Family Medicine) Nicholas Lose, MD as Consulting Physician (Hematology and Oncology) Excell Seltzer, MD as Consulting Physician (General Surgery)  SUMMARY OF ONCOLOGIC HISTORY:   Breast cancer left T1aN0 S/P lumpectomy SLN, radiation, Hormonal Rx   02/21/2011 Surgery Left lumpectomy: IDC grade 3, 0.48 cm, margins negative, DCIS high-grade focally near multiple margins, 0/1 lymph node, T1 1 N0 stage IA, ER 99%, PR 21%, Ki-67 62%, HER-2 negative ratio 1.19   04/11/2011 - 05/20/2011 Radiation Therapy adjuvant radiation   06/10/2011 -  Anti-estrogen oral therapy anastrozole 1 mg daily    CHIEF COMPLIANT: follow-up of MGUS, breast cancer on anastrozole  INTERVAL HISTORY: Tammie [name] is a 81 year old lady with above-mentioned history of left breast cancer treated with lumpectomy and adjuvant radiation is currently on anastrozole. She is tolerating it extremely well. She does have osteoporosis for which she takes calcium and vitamin D along with Fosamax. She will complete 5 years of therapy as of November 2017. She was complaining of neck pain and she underwent a neurological evaluation. As part of the workup serum protein electrophoresis was obtained which showed a small amount of IgG kappa monoclonal protein measuring 0.7 g. She underwent a neck MRI which did not show any metastatic disease. It did show os arthritis as well as a small degree of spinal stenosis. She is here today to discuss the results of all of these tests. She underwent a breast mammogram and ultrasound in May 2016. The mammogram and ultrasound revealed the tiny area of abnormality which was biopsy-proven to be benign breast tissue.  REVIEW OF SYSTEMS:   Constitutional: Denies fevers, chills or abnormal weight loss Eyes: Denies blurriness of vision Ears, nose, mouth, throat, and face: Denies mucositis or sore throat Respiratory: Denies cough, dyspnea or  wheezes Cardiovascular: Denies palpitation, chest discomfort or lower extremity swelling Gastrointestinal:  Denies nausea, heartburn or change in bowel habits Skin: Denies abnormal skin rashes Lymphatics: Denies new lymphadenopathy or easy bruising Neurological:occasional unsteadiness and the gait and neck pain Behavioral/Psych: Mood is stable, no new changes  Breast:  denies any pain or lumps or nodules in either breasts All other systems were reviewed with the patient and are negative.  I have reviewed the past medical history, past surgical history, social history and family history with the patient and they are unchanged from previous note.  ALLERGIES:  has No Known Allergies.  MEDICATIONS:  Current Outpatient Prescriptions  Medication Sig Dispense Refill  . acetaminophen (TYLENOL) 325 MG tablet Take 2 tablets (650 mg total) by mouth every 6 (six) hours as needed for mild pain (or Fever >/= 101).    [name] alendronate (FOSAMAX) 70 MG tablet Take 1 tablet (70 mg total) by mouth once a week. Take with a full glass of water on an empty stomach. 12 tablet 1  . amLODipine (NORVASC) 10 MG tablet Take 10 mg by mouth daily.      [name] anastrozole (ARIMIDEX) 1 MG tablet Take 1 tablet (1 mg total) by mouth daily. 90 tablet 12  . aspirin 325 MG EC tablet Take 325 mg by mouth daily.    . benazepril (LOTENSIN) 20 MG tablet Take 20 mg by mouth daily. Take 1 1/2 tab = 30 mg    . Cholecalciferol (VITAMIN D-3) 1000 UNITS CAPS Take 1 capsule by mouth daily.     . clopidogrel (PLAVIX) 75 MG tablet Take 75 mg by mouth at bedtime.     [name] FLUoxetine (  PROZAC) 20 MG tablet Take 20 mg by mouth daily.      . furosemide (LASIX) 40 MG tablet Take 40 mg by mouth daily.     [name] levothyroxine (SYNTHROID, LEVOTHROID) 88 MCG tablet Take 88 mcg by mouth daily.    . Multiple Vitamins-Minerals (CENTRUM SILVER PO) Take by mouth daily.    . simvastatin (ZOCOR) 20 MG tablet Take 20 mg by mouth at bedtime.      No current  facility-administered medications for this visit.    PHYSICAL EXAMINATION: ECOG PERFORMANCE STATUS: 1 - Symptomatic but completely ambulatory  Filed Vitals:   04/10/15 1032  BP: 172/48  Pulse: 51  Temp: 97.9 F (36.6 C)  Resp: 18   Filed Weights   04/10/15 1032  Weight: 118 lb 12.8 oz (53.887 kg)    GENERAL:alert, no distress and comfortable SKIN: skin color, texture, turgor are normal, no rashes or significant lesions EYES: normal, Conjunctiva are pink and non-injected, sclera clear OROPHARYNX:no exudate, no erythema and lips, buccal mucosa, and tongue normal  NECK: supple, thyroid normal size, non-tender, without nodularity LYMPH:  no palpable lymphadenopathy in the cervical, axillary or inguinal LUNGS: clear to auscultation and percussion with normal breathing effort HEART: regular rate & rhythm and no murmurs and no lower extremity edema ABDOMEN:abdomen soft, non-tender and normal bowel sounds Musculoskeletal:no cyanosis of digits and no clubbing  NEURO: alert & oriented x 3 with fluent speech, no focal motor/sensory deficits BREAST: No palpable masses or nodules in either right or left breasts. No palpable axillary supraclavicular or infraclavicular adenopathy no breast tenderness or nipple discharge. (exam performed in the presence of a chaperone)  LABORATORY DATA:  I have reviewed the data as listed   Chemistry      Component Value Date/Time   NA 141 04/24/2014 0814   NA 140 09/03/2013 0600   K 3.9 04/24/2014 0814   K 3.9 09/03/2013 0600   CL 105 09/03/2013 0600   CL 103 01/18/2013 0918   CO2 27 04/24/2014 0814   CO2 22 09/03/2013 0600   BUN 18.3 04/24/2014 0814   BUN 17 09/03/2013 0600   CREATININE 1.2* 04/24/2014 0814   CREATININE 0.90 09/03/2013 0600      Component Value Date/Time   CALCIUM 10.4 04/24/2014 0814   CALCIUM 8.3* 09/03/2013 0600   ALKPHOS 79 04/24/2014 0814   ALKPHOS 66 01/13/2012 1038   AST 19 04/24/2014 0814   AST 22 01/13/2012 1038    ALT 14 04/24/2014 0814   ALT 24 01/13/2012 1038   BILITOT 0.45 04/24/2014 0814   BILITOT 0.4 01/13/2012 1038       Lab Results  Component Value Date   WBC 7.7 04/24/2014   HGB 13.2 04/24/2014   HCT 40.5 04/24/2014   MCV 92.6 04/24/2014   PLT 211 04/24/2014   NEUTROABS 5.0 04/24/2014   ASSESSMENT & PLAN:  Breast cancer left T1aN0 S/P lumpectomy SLN, radiation, Hormonal Rx Left lumpectomy 02/21/2011: IDC grade 3, 0.48 cm, margins negative, DCIS high-grade focally near multiple margins, 0/1 lymph node, T1 1 N0 stage IA, ER 99%, PR 21%, Ki-67 62%, HER-2 negative ratio 1.19, status post radiation completed 05/21/2011, Arimidex 1 mg daily started 06/03/2011, osteoporosis on Fosamax  Arimidex toxicities: 1. Osteoporosis: Takes Fosamax and calcium and vitamin D Otherwise patient denies any hot flashes or muscle aches. Patient will finish 5 years of therapy by November 2017. At that time she can stop antiestrogen therapy.  Breast Cancer Surveillance: 1. Breast exam 04/10/2015: Normal 2.  Mammogram 12/30/2014: Architectural distortion left breast 1:00 position 9 x 7 x 7 mm no lymphadenopathy, ultrasound-guided biopsy 01/06/2015 showed benign breast tissue. 3. MRI of the cervical spine 04/03/2015:no evidence of metastatic disease. But advanced multilevel cervical disc and facet degeneration with mild spinal stenosis at C6-C7 and mild foraminal stenosis (R though did not think surgery is necessary)   MGUS: IgG Kappa: 0.7 g detected August 2016 as part of the workup for neck pain with neuropathy. There is no evidence of anemia or hypercalcemia, mild renal dysfunction is unlikely to be related to monoclonal protein. Based on lack of evidence of myeloma, I believe this is MGUS.  Recommendation: Monitor monoclonal protein along with CBC and CMP annually. I discussed with her in great detail the significance of this monoclonal protein and the risk of transformation to myeloma.  Return to clinic  in 1 year with labs one week prior to the appointment.  Orders Placed This Encounter  Procedures  . CBC with Differential    Standing Status: Future     Number of Occurrences:      Standing Expiration Date: 04/09/2016  . Comprehensive metabolic panel (Cmet) - [facility]    Standing Status: Future     Number of Occurrences:      Standing Expiration Date: 04/09/2016  . SPEP & IFE with QIG    Standing Status: Future     Number of Occurrences:      Standing Expiration Date: 05/14/2016   The patient has a good understanding of the overall plan. she agrees with it. she will call with any problems that may develop before the next visit here.   Rulon Eisenmenger, MD

## 2015-04-21 ENCOUNTER — Ambulatory Visit (INDEPENDENT_AMBULATORY_CARE_PROVIDER_SITE_OTHER): Payer: Medicare Other | Admitting: Neurology

## 2015-04-21 DIAGNOSIS — G609 Hereditary and idiopathic neuropathy, unspecified: Secondary | ICD-10-CM

## 2015-04-21 DIAGNOSIS — R292 Abnormal reflex: Secondary | ICD-10-CM

## 2015-04-21 NOTE — Procedures (Signed)
[facility]  [place]  [place], [place] [address] Tel: [phone] Fax:  [phone] Test Date:  04/21/2015  Patient: Tammie [name] DOB: 11/03/1933 Physician: Narda Amber  Sex: Female Height: 5\' 2"  Ref Phys: Alonza Bogus, D.O.  ID#: [id number] Temp: 35.3C Technician: Jerilynn Mages. Dean   Patient Complaints: This is a 81 year old female presenting for evaluation of gait imbalance.   NCV & EMG Findings: Extensive electrodiagnostic testing of the right lower extremity and additional studies of the left shows:  1. Bilateral sural and superficial peroneal sensory responses are absent. 2. Bilateral peroneal motor responses recording at the extensor digitorum brevis are reduced; however, peroneal motor responses at the tibialis anterior is within normal limits. Bilateral tibial motor responses are within normal limits. 3. Chronic motor axon loss changes are seen affecting the distal muscles of the lower legs, conforming to a gradient pattern. There is no evidence of accompanied active denervation.  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 Overall, these findings are moderate in degree electrically.   ___________________________ Narda Amber, DO    Nerve Conduction Studies Anti Sensory Summary Table   Stim Site NR Peak (ms) Norm Peak (ms) P-T Amp (V) Norm P-T Amp  Left Sup Peroneal Anti Sensory (Ant Lat Mall)  35.3C  12 cm NR  <4.6  >3  Right Sup Peroneal Anti Sensory (Ant Lat Mall)  35.3C  12 cm NR  <4.6  >3  Left Sural Anti Sensory (Lat Mall)  35.3C  Calf NR  <4.6  >3  Right Sural Anti Sensory (Lat Mall)  35.3C  Calf NR  <4.6  >3   Motor Summary Table   Stim Site NR Onset (ms) Norm Onset (ms) O-P Amp (mV) Norm O-P Amp Site1 Site2 Delta-0 (ms) Dist (cm) Vel (m/s) Norm Vel (m/s)  Left Peroneal Motor (Ext Dig Brev)  35.3C  Ankle    3.8 <6.0 1.9 >2.5  B Fib Ankle 6.7 32.0 48 >40  B Fib    10.5  1.4  Poplt B Fib 1.5 9.0 60 >40  Poplt    12.0  1.4         Right Peroneal Motor (Ext Dig Brev)  35.3C  Ankle    3.8 <6.0 2.1 >2.5 B Fib Ankle 6.7 32.0 48 >40  B Fib    10.5  1.7  Poplt B Fib 2.1 10.0 48 >40  Poplt    12.6  1.8         Left Peroneal TA Motor (Tib Ant)  35.3C  Fib Head    2.3 <4.5 3.8 >3 Poplit Fib Head 2.1 10.0 48 >40  Poplit    4.4  3.0         Right Peroneal TA Motor (Tib Ant)  35.3C  Fib Head    2.5 <4.5 3.0 >3 Poplit Fib Head 1.6 10.0 63 >40  Poplit    4.1  3.4         Left Tibial Motor (Abd Hall Brev)  35.3C  Ankle    3.5 <6.0 5.7 >4 Knee Ankle 8.5 38.0 45 >40  Knee    12.0  4.5         Right Tibial Motor (Abd Hall Brev)  35.3C  Ankle    3.0 <6.0 4.1 >4 Knee Ankle 8.6 38.0 44 >40  Knee    11.6  1.4          H  Reflex Studies   NR H-Lat (ms) Lat Norm (ms) L-R H-Lat (ms)  Left Tibial (Gastroc)  35.3C     32.11 <35 1.22  Right Tibial (Gastroc)  35.3C     33.33 <35 1.22   EMG   Side Muscle Ins Act Fibs Psw Fasc Number Recrt Dur Dur. Amp Amp. Poly Poly. Comment  Left AntTibialis Nml Nml Nml Nml 1- Mod-R Few 1+ Few 1+ Nml Nml N/A  Left Gastroc Nml Nml Nml Nml 1- Mod-R Few 1+ Few 1+ Nml Nml N/A  Left RectFemoris Nml Nml Nml Nml Nml Nml Nml Nml Nml Nml Nml Nml N/A  Right Flex Dig Long Nml Nml Nml Nml 2- Rapid Some 1+ Some 1+ Nml Nml N/A  Right AntTibialis Nml Nml Nml Nml 1- Rapid Few 1+ Few 1+ Nml Nml N/A  Right Gastroc Nml Nml Nml Nml 1- Mod-R Few 1+ Few 1+ Nml Nml N/A  Right RectFemoris Nml Nml Nml Nml Nml Nml Nml Nml Nml Nml Nml Nml N/A  Right GluteusMed Nml Nml Nml Nml Nml Nml Nml Nml Nml Nml Nml Nml N/A  Right BicepsFemS Nml Nml Nml Nml Nml Nml Nml Nml Nml Nml Nml Nml N/A      Waveforms:

## 2015-04-22 ENCOUNTER — Telehealth: Payer: Self-pay | Admitting: Neurology

## 2015-04-22 NOTE — Telephone Encounter (Signed)
-----   Message from [place], DO sent at 04/22/2015  8:17 AM EDT ----- You can let pt know that EMG confirmed evidence of moderate peripheral neuropathy, which can cause loss of balance

## 2015-04-22 NOTE — Telephone Encounter (Signed)
Left message on machine for patient to call back. Awaiting call back to make her aware of results and offer PT if wanted.

## 2015-04-23 NOTE — Telephone Encounter (Signed)
Left message on machine for patient to call back.

## 2015-04-23 NOTE — Telephone Encounter (Signed)
Patient made aware of results. She had PT two years ago and thinks she can do exercises on her own. She is not interested in a referral at this time. Will call if needed.

## 2015-05-21 ENCOUNTER — Encounter: Payer: Self-pay | Admitting: Hematology and Oncology

## 2015-05-21 NOTE — Progress Notes (Signed)
Pt called inquiring about a bill for $50 for [facility] that she had paid. Went into her billing to locate the charge for $50. Found copay amount due for 04/10/15 office visit for Dr.Gudena. Pt states she had her cancelled checks and went through them while we were on the phone. Pt could not locate a check for $50 for 04/10/15 to [facility]. After spending several minutes with the patient, she said she would look back through her statements and see if she could find it and if not she understands that she must truly owe the $50 and would pay it. I offered the patient to contact me back if she finds a check or if she needed any further assistance.

## 2015-06-03 ENCOUNTER — Other Ambulatory Visit: Payer: Self-pay | Admitting: Family Medicine

## 2015-06-03 DIAGNOSIS — N6489 Other specified disorders of breast: Secondary | ICD-10-CM

## 2015-06-16 ENCOUNTER — Emergency Department (INDEPENDENT_AMBULATORY_CARE_PROVIDER_SITE_OTHER): Payer: Medicare Other

## 2015-06-16 ENCOUNTER — Emergency Department (INDEPENDENT_AMBULATORY_CARE_PROVIDER_SITE_OTHER)
Admission: EM | Admit: 2015-06-16 | Discharge: 2015-06-16 | Disposition: A | Discharge status: Home / Self Care | Payer: Medicare Other | Source: Home / Self Care | Attending: Emergency Medicine | Admitting: Emergency Medicine

## 2015-06-16 ENCOUNTER — Encounter (HOSPITAL_COMMUNITY): Payer: Self-pay | Admitting: Emergency Medicine

## 2015-06-16 DIAGNOSIS — S52501A Unspecified fracture of the lower end of right radius, initial encounter for closed fracture: Secondary | ICD-10-CM

## 2015-06-16 NOTE — ED Notes (Signed)
Right wrist pain .  Patient reports falling last night.  Reports she is off balance at baseline due to right leg fracture as a child and  History of right hip fracture.  Patient denies any other injuries resulting from this fall.  Patient has right wrist bruising, swelling and pain.  Able to move fingers, but very painful.  Right radial pulse is 2 plus.

## 2015-06-16 NOTE — ED Provider Notes (Signed)
CSN: [id number]     Arrival date & time 06/16/15  1558 History   First MD Initiated Contact with Patient 06/16/15 1648     Chief Complaint  Patient presents with  . Wrist Pain   (Consider location/radiation/quality/duration/timing/severity/associated sxs/prior Treatment) HPI Comments: 81 year old female fell at home last p.m. she fell on her right outstretched arm. This produced an injury to the wrist where she is complaining of pain swelling and tenderness. She has limited movement to the wrist. Denies other injury. She did not injure her head or neck, back, chest or other extremities. She has been fully awake and alert and remembers the events of the accident. She is ambulatory and in no acute distress.  Patient is a 81 y.o. female presenting with wrist pain.  Wrist Pain    Past Medical History  Diagnosis Date  . Breast lump     left, benign  . Depression   . Hyperlipidemia   . Hypertension   . GERD (gastroesophageal reflux disease)   . Cancer [facility])     breast  . Breast cancer T1aN0 S/P lumpectomy SLN Bx 01/2011 03/25/2011  . Myocardial infarction ([place]) 1999    mi  . Bilateral carotid artery stenosis     hx  . History of ETOH abuse     member [employer] 22.5 years  . Thyroid disease     hypo  . Lumbar herniated disc   . Fall from grocery cart Jan. 31, 2015    Fx  Right  Hip   Past Surgical History  Procedure Laterality Date  . Breast lumpectomy Left 2012    bening  . Breast biopsy  200, 88  . Breast biopsy  1985/2000    2 BENIGN LEFT BREAST BXS  . Femur im nail Right 09/01/2013    Procedure: INTRAMEDULLARY (IM) NAIL FEMORAL;  Surgeon: [name], MD;  Location: [place];  Service: Orthopedics;  Laterality: Right;  . Fracture surgery Right Jan. 31, 2015    Hip   Family History  Problem Relation Age of Onset  . Breast cancer Paternal Aunt   . Heart attack Mother   . Hypertension Mother   . Hyperlipidemia Mother   . Heart attack Father   . Hypertension Father   .  Hyperlipidemia Father    Social History  Substance Use Topics  . Smoking status: Former Smoker    Types: Cigarettes    Quit date: 09/29/2004  . Smokeless tobacco: Never Used     Comment: 2007- quit  . 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  HENT: Negative.   Respiratory: Negative.   Cardiovascular: Negative.  Negative for leg swelling.  Musculoskeletal: Positive for joint swelling. Negative for back pain, gait problem, neck pain and neck stiffness.       As per HPI  Skin: Negative for color change and pallor.  Neurological: Negative.     Allergies  Review of patient's allergies indicates no known allergies.  Home Medications   Prior to Admission medications   Medication Sig Start Date End Date Taking? Authorizing Provider  acetaminophen (TYLENOL) 325 MG tablet Take  2 tablets (650 mg total) by mouth every 6 (six) hours as needed for mild pain (or Fever >/= 101). 09/03/13   Maryann Mikhail, DO  alendronate (FOSAMAX) 70 MG tablet Take 1 tablet (70 mg total) by mouth once a week. Take with a full glass of water on an empty stomach. 10/09/13   Consuela Mimes, MD  amLODipine (NORVASC) 10 MG tablet Take 10 mg by mouth daily.      Historical Provider, MD  anastrozole (ARIMIDEX) 1 MG tablet Take 1 tablet (1 mg total) by mouth daily. 07/03/13   Consuela Mimes, MD  aspirin 325 MG EC tablet Take 325 mg by mouth daily.    Historical Provider, MD  benazepril (LOTENSIN) 20 MG tablet Take 20 mg by mouth daily. Take 1 1/2 tab = 30 mg    Historical Provider, MD  Cholecalciferol (VITAMIN D-3) 1000 UNITS CAPS Take 1 capsule by mouth daily.     Historical Provider, MD  clopidogrel (PLAVIX) 75 MG tablet Take 75 mg by mouth at bedtime.     Historical Provider, MD   FLUoxetine (PROZAC) 20 MG tablet Take 20 mg by mouth daily.      Historical Provider, MD  furosemide (LASIX) 40 MG tablet Take 40 mg by mouth daily.     Historical Provider, MD  levothyroxine (SYNTHROID, LEVOTHROID) 88 MCG tablet Take 88 mcg by mouth daily.    Historical Provider, MD  Multiple Vitamins-Minerals (CENTRUM SILVER PO) Take by mouth daily.    Historical Provider, MD  simvastatin (ZOCOR) 20 MG tablet Take 20 mg by mouth at bedtime.     Historical Provider, MD   Meds Ordered and Administered this Visit  Medications - No data to display  BP 194/63 mmHg  Pulse 64  Temp(Src) 98.5 F (36.9 C) (Oral)  Resp 18  SpO2 99% No data found.   Physical Exam  Constitutional: She is oriented to person, place, and time. She appears well-developed and well-nourished. No distress.  HENT:  Head: Normocephalic and atraumatic.  Eyes: EOM are normal.  Neck: Normal range of motion. Neck supple.  Cardiovascular: Normal rate.   Pulmonary/Chest: Effort normal. No respiratory distress.  Musculoskeletal:  Right wrist with swelling, ecchymosis, tenderness and diminished function in regards to movement and range of motion. Radial pulse intact. Distal sensory and motor is intact. Movement of the digits are intact. No hand tenderness. Denies pain or tenderness to the elbow or upper arm.  Neurological: She is alert and oriented to person, place, and time. No cranial nerve deficit.  Skin: Skin is warm and dry.  Psychiatric: She has a normal mood and affect.  Nursing note and vitals reviewed.   ED Course  Procedures (including critical care time)  Labs Review Labs Reviewed - No data to display  Imaging Review Dg Wrist Complete Right  06/16/2015  CLINICAL DATA:  [place] [address] in walking closet hurting RIGHT wrist last night, pain and swelling, initial encounter EXAM: RIGHT WRIST - COMPLETE 3+ VIEW COMPARISON:  None FINDINGS: Osseous demineralization. Joint space narrowing and spur formation with  subluxation at first [facility] joint compatible with advanced degenerative changes. Transverse metaphyseal fracture distal RIGHT radius with mild dorsal displacement and apex volar angulation. No definite intra-articular extension. No additional fracture, dislocation or bone destruction. Mild chondrocalcinosis at carpus question CPPD. IMPRESSION: Minimally displaced and angulated distal RIGHT radial metaphyseal fracture. Advanced degenerative changes first CMC joint. Scattered chondrocalcinosis question CPPD. Electronically Signed   By: Lavonia Dana M.D.   On: 06/16/2015 17:03  Visual Acuity Review  Right Eye Distance:   Left Eye Distance:   Bilateral Distance:    Right Eye Near:   Left Eye Near:    Bilateral Near:         MDM   1. Distal radius fracture, right, closed, initial encounter    Spoke with receptionist at the [facility] orthopedic urgent care. Since the patient had requested Dr. Mardelle Matte associated with this group they agreed to see her at this time for fracture management Temporary splint applied prior to leaving. The patient is being discharged but will be going directly to the urgent care at this time. The patient has a significant other with her to take her to the urgent care. She will follow with Dr. Mardelle Matte and the orthopedic group.   Janne Napoleon, NP 06/16/15 [phone]

## 2015-06-16 NOTE — Discharge Instructions (Signed)
Wrist Fracture °A wrist fracture is a break or crack in one of the bones of your wrist. Your wrist is made up of eight small bones at the palm of your hand (carpal bones) and two long bones that make up your forearm (radius and ulna). °CAUSES °· A direct blow to the wrist. °· Falling on an outstretched hand. °· Trauma, such as a car accident or a fall. °RISK FACTORS °Risk factors for wrist fracture include: °· Participating in contact and high-risk sports, such as skiing, biking, and ice skating. °· Taking steroid medicines. °· Smoking. °· Being female. °· Being Caucasian. °· Drinking more than three alcoholic beverages per day. °· Having low or lowered bone density (osteoporosis or osteopenia). °· Age. Older adults have decreased bone density. °· Women who have had menopause. °· History of previous fractures. °SIGNS AND SYMPTOMS °Symptoms of wrist fractures include tenderness, bruising, and inflammation. Additionally, the wrist may hang in an odd position or appear deformed. °DIAGNOSIS °Diagnosis may include: °· Physical exam. °· X-ray. °TREATMENT °Treatment depends on many factors, including the nature and location of the fracture, your age, and your activity level. Treatment for wrist fracture can be nonsurgical or surgical. °Nonsurgical Treatment °A plaster cast or splint may be applied to your wrist if the bone is in a good position. If the fracture is not in good position, it may be necessary for your health care provider to realign it before applying a splint or cast. Usually, a cast or splint will be worn for several weeks. °Surgical Treatment °Sometimes the position of the bone is so far out of place that surgery is required to apply a device to hold it together as it heals. Depending on the fracture, there are a number of options for holding the bone in place while it heals, such as a cast and metal pins. °HOME CARE INSTRUCTIONS °· Keep your injured wrist elevated and move your fingers as much as  possible. °· Do not put pressure on any part of your cast or splint. It may break. °· Use a plastic bag to protect your cast or splint from water while bathing or showering. Do not lower your cast or splint into water. °· Take medicines only as directed by your health care provider. °· Keep your cast or splint clean and dry. If it becomes wet, damaged, or suddenly feels too tight, contact your health care provider right away. °· Do not use any tobacco products including cigarettes, chewing tobacco, or electronic cigarettes. Tobacco can delay bone healing. If you need help quitting, ask your health care provider. °· Keep all follow-up visits as directed by your health care provider. This is important. °· Ask your health care provider if you should take supplements of calcium and vitamins C and D to promote bone healing. °SEEK MEDICAL CARE IF: °· Your cast or splint is damaged, breaks, or gets wet. °· You have a fever. °· You have chills. °· You have continued severe pain or more swelling than you did before the cast was put on. °SEEK IMMEDIATE MEDICAL CARE IF: °· Your hand or fingernails on the injured arm turn blue or gray, or feel cold or numb. °· You have decreased feeling in the fingers of your injured arm. °MAKE SURE YOU: °· Understand these instructions. °· Will watch your condition. °· Will get help right away if you are not doing well or get worse. °  °This information is not intended to replace advice given to you by your   health care provider. Make sure you discuss any questions you have with your health care provider. °  °Document Released: 04/27/2005 Document Revised: 04/08/2015 Document Reviewed: 08/05/2011 °Elsevier Interactive Patient Education ©2016 Elsevier Inc. ° °

## 2015-07-20 ENCOUNTER — Ambulatory Visit
Admission: RE | Admit: 2015-07-20 | Discharge: 2015-07-20 | Disposition: A | Discharge status: Home / Self Care | Payer: Medicare Other | Source: Ambulatory Visit | Attending: Family Medicine | Admitting: Family Medicine

## 2015-07-20 DIAGNOSIS — N6489 Other specified disorders of breast: Secondary | ICD-10-CM

## 2015-07-23 ENCOUNTER — Encounter (HOSPITAL_COMMUNITY): Payer: Self-pay

## 2015-07-23 ENCOUNTER — Ambulatory Visit: Payer: Self-pay | Admitting: Family

## 2015-08-25 ENCOUNTER — Encounter: Payer: Self-pay | Admitting: Family

## 2015-09-03 ENCOUNTER — Ambulatory Visit (INDEPENDENT_AMBULATORY_CARE_PROVIDER_SITE_OTHER): Payer: Medicare Other | Admitting: Family

## 2015-09-03 ENCOUNTER — Ambulatory Visit (HOSPITAL_COMMUNITY)
Admission: RE | Admit: 2015-09-03 | Discharge: 2015-09-03 | Disposition: A | Discharge status: Home / Self Care | Payer: Medicare Other | Source: Ambulatory Visit | Attending: Family | Admitting: Family

## 2015-09-03 ENCOUNTER — Encounter: Payer: Self-pay | Admitting: Family

## 2015-09-03 VITALS — BP 134/59 | HR 54 | Ht 62.0 in | Wt 106.6 lb

## 2015-09-03 DIAGNOSIS — I6523 Occlusion and stenosis of bilateral carotid arteries: Secondary | ICD-10-CM | POA: Diagnosis not present

## 2015-09-03 DIAGNOSIS — I6529 Occlusion and stenosis of unspecified carotid artery: Secondary | ICD-10-CM | POA: Diagnosis not present

## 2015-09-03 DIAGNOSIS — I10 Essential (primary) hypertension: Secondary | ICD-10-CM | POA: Diagnosis not present

## 2015-09-03 DIAGNOSIS — E785 Hyperlipidemia, unspecified: Secondary | ICD-10-CM | POA: Insufficient documentation

## 2015-09-03 NOTE — Progress Notes (Signed)
Chief Complaint: Extracranial Carotid Artery Stenosis   History of Present Illness  Tammie [name] is a 82 y.o. female patient of Dr. Oneida Alar followed for known carotid artery stenosis.   She has not had previous carotid artery intervention.  She has no history of TIA or stroke symptoms.Specifically the patient denies a history of amaurosis fugax or monocular blindness, unilateral facial drooping, hemiparesis, or receptive or expressive aphasia.   She denies claudication symptoms with walking.   Pt states that her oncologist increased her ASA dose from 81 to 325 mg daily.  Pt reports New Medical or Surgical History: fell and fractured right wrist 06/21/15, is wearing a right wrist splint. 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   She fell and fractured right hip January, 2015, was surgically repaired, states [facility] was excellent. She states her right leg is an inch shorter since a childhood accident, has a shoe insert for her right foot.  Pt Diabetic: states she was prediabetic until she improved her diet,  she lost weight as a result of this. 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 Breast lump     left, benign  . Depression   . Hyperlipidemia   . Hypertension   . GERD (gastroesophageal reflux disease)   . Cancer [facility])     breast  . Breast cancer T1aN0 S/P lumpectomy SLN Bx 01/2011 03/25/2011  . Myocardial infarction ([place]) 1999    mi  . Bilateral carotid artery stenosis     hx  . History of ETOH abuse     member [employer] 22.5 years  . Thyroid disease     hypo  . Lumbar herniated disc   . Fall from grocery cart Jan. 31, 2015    Fx  Right  Hip    Social History Social History  Substance Use Topics  . Smoking status: Former Smoker    Types: Cigarettes    Quit date: 09/29/2004  . Smokeless tobacco:  Never Used     Comment: 2007- quit  . Alcohol Use: No     Comment: hx of EtOHism - in [employer] x 27 years    Family History Family History  Problem Relation Age of Onset  . Breast cancer Paternal Aunt   . Heart attack Mother   . Hypertension Mother   . Hyperlipidemia Mother   . Heart attack Father   . Hypertension Father   . Hyperlipidemia Father     Surgical History Past Surgical History  Procedure Laterality Date  . Breast lumpectomy Left 2012    bening  . Breast biopsy  200, 88  . Breast biopsy  1985/2000    2 BENIGN LEFT BREAST BXS  . Femur im nail Right 09/01/2013    Procedure: INTRAMEDULLARY (IM) NAIL FEMORAL;  Surgeon: [name], MD;  Location: [facility];  Service: Orthopedics;  Laterality: Right;  . Fracture surgery Right Jan. 31, 2015    Hip    No Known Allergies  Current Outpatient Prescriptions  Medication Sig Dispense Refill  . acetaminophen (TYLENOL) 325 MG tablet Take 2 tablets (650 mg total) by mouth every 6 (six) hours as needed for mild pain (or Fever >/= 101).    [name] alendronate (FOSAMAX) 70 MG tablet Take 1 tablet (70 mg total) by mouth once a week. Take with a full  glass of water on an empty stomach. 12 tablet 1  . amLODipine (NORVASC) 10 MG tablet Take 10 mg by mouth daily.      [name] anastrozole (ARIMIDEX) 1 MG tablet Take 1 tablet (1 mg total) by mouth daily. 90 tablet 12  . aspirin 325 MG EC tablet Take 325 mg by mouth daily.    . benazepril (LOTENSIN) 20 MG tablet Take 20 mg by mouth daily. Take 1 1/2 tab = 30 mg    . Cholecalciferol (VITAMIN D-3) 1000 UNITS CAPS Take 1 capsule by mouth daily.     . clopidogrel (PLAVIX) 75 MG tablet Take 75 mg by mouth at bedtime.     [name] FLUoxetine (PROZAC) 20 MG tablet Take 20 mg by mouth daily.      . furosemide (LASIX) 40 MG tablet Take 40 mg by mouth daily.     [name] levothyroxine (SYNTHROID, LEVOTHROID) 88 MCG tablet Take 88 mcg by mouth daily.    . Multiple Vitamins-Minerals (CENTRUM SILVER PO) Take by mouth daily.    .  simvastatin (ZOCOR) 20 MG tablet Take 20 mg by mouth at bedtime.      No current facility-administered medications for this visit.    Review of Systems : See HPI for pertinent positives and negatives.  Physical Examination  Filed Vitals:   09/03/15 1045 09/03/15 1050  BP: 136/60 134/59  Pulse: 54   Height: 5\' 2"  (1.575 m)   Weight: 106 lb 9.6 oz (48.353 kg)   SpO2: 99%    Body mass index is 19.49 kg/(m^2).  General: WDWN female in NAD GAIT: right leg shorter Eyes: PERRLA Pulmonary: Non-labored, CTAB, no rales, rhonchi, or wheezing.  Cardiac: regular rhythm, no detected murmur.  VASCULAR EXAM Carotid Bruits Left Right   positive positive    Radial pulses are 2+ palpable and equal.      LE Pulses LEFT RIGHT   POPLITEAL not palpable  not palpable   POSTERIOR TIBIAL not palpable  not palpable    DORSALIS PEDIS  ANTERIOR TIBIAL palpable  palpable     Gastrointestinal: soft, nontender, BS WNL, no r/g,no palpable masses.  Musculoskeletal: Negative muscle atrophy/wasting. M/S 4/5 in UE's, 4/5 in LE's, Extremities without ischemic changes. Wrist splint on right wrist.  Neurologic: A&O X 3; Appropriate Affect , normal sensation,  Speech is normal CN 2-12 intact except some hearing loss, Pain and light touch intact in extremities, Motor exam as listed above.         Non-Invasive Vascular Imaging CAROTID DUPLEX 09/03/2015   Right ICA: <40% stenosis. Left ICA: 40 - 59 % stenosis. No significant change compared to 02/26/15   Assessment: Tammie [name] is a 82 y.o. female who has no hx of stroke or TIA. Today's carotid duplex suggests <40% right ICA stenosis and 40 - 59 % left ICA stenosis. No significant change compared to 02/26/15   Plan: Follow-up in 1 year with Carotid Duplex scan.   I  discussed in depth with the patient the nature of atherosclerosis, and emphasized the importance of maximal medical management including strict control of blood pressure, blood glucose, and lipid levels, obtaining regular exercise, and continued cessation of smoking.  The patient is aware that without maximal medical management the underlying atherosclerotic disease process will progress, limiting the benefit of any interventions. The patient was given information about stroke prevention and what symptoms should prompt the patient to seek immediate medical care. Thank you for allowing [place] to participate in this patient's care.  Clemon Chambers, RN, MSN, FNP-C [facility] Office: [phone]  Clinic Physician: Oneida Alar  09/03/2015 10:52 AM

## 2015-09-03 NOTE — Patient Instructions (Signed)
Stroke Prevention Some medical conditions and behaviors are associated with an increased chance of having a stroke. You may prevent a stroke by making healthy choices and managing medical conditions. HOW CAN I REDUCE MY RISK OF HAVING A STROKE?   Stay physically active. Get at least 30 minutes of activity on most or all days.  Do not smoke. It may also be helpful to avoid exposure to secondhand smoke.  Limit alcohol use. Moderate alcohol use is considered to be:  No more than 2 drinks per day for men.  No more than 1 drink per day for nonpregnant women.  Eat healthy foods. This involves:  Eating 5 or more servings of fruits and vegetables a day.  Making dietary changes that address high blood pressure (hypertension), high cholesterol, diabetes, or obesity.  Manage your cholesterol levels.  Making food choices that are high in fiber and low in saturated fat, trans fat, and cholesterol may control cholesterol levels.  Take any prescribed medicines to control cholesterol as directed by your health care provider.  Manage your diabetes.  Controlling your carbohydrate and sugar intake is recommended to manage diabetes.  Take any prescribed medicines to control diabetes as directed by your health care provider.  Control your hypertension.  Making food choices that are low in salt (sodium), saturated fat, trans fat, and cholesterol is recommended to manage hypertension.  Ask your health care provider if you need treatment to lower your blood pressure. Take any prescribed medicines to control hypertension as directed by your health care provider.  If you are 18-39 years of age, have your blood pressure checked every 3-5 years. If you are 40 years of age or older, have your blood pressure checked every year.  Maintain a healthy weight.  Reducing calorie intake and making food choices that are low in sodium, saturated fat, trans fat, and cholesterol are recommended to manage  weight.  Stop drug abuse.  Avoid taking birth control pills.  Talk to your health care provider about the risks of taking birth control pills if you are over 35 years old, smoke, get migraines, or have ever had a blood clot.  Get evaluated for sleep disorders (sleep apnea).  Talk to your health care provider about getting a sleep evaluation if you snore a lot or have excessive sleepiness.  Take medicines only as directed by your health care provider.  For some people, aspirin or blood thinners (anticoagulants) are helpful in reducing the risk of forming abnormal blood clots that can lead to stroke. If you have the irregular heart rhythm of atrial fibrillation, you should be on a blood thinner unless there is a good reason you cannot take them.  Understand all your medicine instructions.  Make sure that other conditions (such as anemia or atherosclerosis) are addressed. SEEK IMMEDIATE MEDICAL CARE IF:   You have sudden weakness or numbness of the face, arm, or leg, especially on one side of the body.  Your face or eyelid droops to one side.  You have sudden confusion.  You have trouble speaking (aphasia) or understanding.  You have sudden trouble seeing in one or both eyes.  You have sudden trouble walking.  You have dizziness.  You have a loss of balance or coordination.  You have a sudden, severe headache with no known cause.  You have new chest pain or an irregular heartbeat. Any of these symptoms may represent a serious problem that is an emergency. Do not wait to see if the symptoms will   go away. Get medical help at once. Call your local emergency services (911 in [place] Do not drive yourself to the hospital.   This information is not intended to replace advice given to you by your health care provider. Make sure you discuss any questions you have with your health care provider.   Document Released: 08/25/2004 Document Revised: 08/08/2014 Document Reviewed:  01/18/2013 Elsevier Interactive Patient Education 2016 Elsevier Inc.  

## 2015-09-21 DIAGNOSIS — Z79899 Other long term (current) drug therapy: Secondary | ICD-10-CM | POA: Diagnosis not present

## 2015-09-21 DIAGNOSIS — I10 Essential (primary) hypertension: Secondary | ICD-10-CM | POA: Diagnosis not present

## 2015-09-21 DIAGNOSIS — E785 Hyperlipidemia, unspecified: Secondary | ICD-10-CM | POA: Diagnosis not present

## 2015-09-21 DIAGNOSIS — E039 Hypothyroidism, unspecified: Secondary | ICD-10-CM | POA: Diagnosis not present

## 2015-09-21 DIAGNOSIS — R7309 Other abnormal glucose: Secondary | ICD-10-CM | POA: Diagnosis not present

## 2015-09-28 DIAGNOSIS — M25531 Pain in right wrist: Secondary | ICD-10-CM | POA: Diagnosis not present

## 2015-10-23 ENCOUNTER — Telehealth: Payer: Self-pay

## 2015-10-23 NOTE — Telephone Encounter (Signed)
Spoke with patient and she is aware of her new appointment time on 9/13

## 2015-11-17 ENCOUNTER — Other Ambulatory Visit: Payer: Self-pay | Admitting: Family Medicine

## 2015-11-17 DIAGNOSIS — N6489 Other specified disorders of breast: Secondary | ICD-10-CM

## 2015-12-31 ENCOUNTER — Ambulatory Visit
Admission: RE | Admit: 2015-12-31 | Discharge: 2015-12-31 | Disposition: A | Discharge status: Home / Self Care | Payer: Medicare Other | Source: Ambulatory Visit | Attending: Family Medicine | Admitting: Family Medicine

## 2015-12-31 DIAGNOSIS — R928 Other abnormal and inconclusive findings on diagnostic imaging of breast: Secondary | ICD-10-CM | POA: Diagnosis not present

## 2015-12-31 DIAGNOSIS — N6489 Other specified disorders of breast: Secondary | ICD-10-CM

## 2016-01-19 DIAGNOSIS — M810 Age-related osteoporosis without current pathological fracture: Secondary | ICD-10-CM | POA: Diagnosis not present

## 2016-01-19 DIAGNOSIS — Z79899 Other long term (current) drug therapy: Secondary | ICD-10-CM | POA: Diagnosis not present

## 2016-01-19 DIAGNOSIS — Z7901 Long term (current) use of anticoagulants: Secondary | ICD-10-CM | POA: Diagnosis not present

## 2016-01-19 DIAGNOSIS — M159 Polyosteoarthritis, unspecified: Secondary | ICD-10-CM | POA: Diagnosis not present

## 2016-01-19 DIAGNOSIS — I10 Essential (primary) hypertension: Secondary | ICD-10-CM | POA: Diagnosis not present

## 2016-01-19 DIAGNOSIS — Z7983 Long term (current) use of bisphosphonates: Secondary | ICD-10-CM | POA: Diagnosis not present

## 2016-01-19 DIAGNOSIS — R7303 Prediabetes: Secondary | ICD-10-CM | POA: Diagnosis not present

## 2016-01-19 DIAGNOSIS — E785 Hyperlipidemia, unspecified: Secondary | ICD-10-CM | POA: Diagnosis not present

## 2016-01-19 DIAGNOSIS — I6522 Occlusion and stenosis of left carotid artery: Secondary | ICD-10-CM | POA: Diagnosis not present

## 2016-01-19 DIAGNOSIS — E039 Hypothyroidism, unspecified: Secondary | ICD-10-CM | POA: Diagnosis not present

## 2016-02-18 DIAGNOSIS — H25813 Combined forms of age-related cataract, bilateral: Secondary | ICD-10-CM | POA: Diagnosis not present

## 2016-02-18 DIAGNOSIS — H04123 Dry eye syndrome of bilateral lacrimal glands: Secondary | ICD-10-CM | POA: Diagnosis not present

## 2016-03-21 DIAGNOSIS — Z79899 Other long term (current) drug therapy: Secondary | ICD-10-CM | POA: Diagnosis not present

## 2016-03-21 DIAGNOSIS — R7303 Prediabetes: Secondary | ICD-10-CM | POA: Diagnosis not present

## 2016-03-21 DIAGNOSIS — Z7901 Long term (current) use of anticoagulants: Secondary | ICD-10-CM | POA: Diagnosis not present

## 2016-03-21 DIAGNOSIS — E039 Hypothyroidism, unspecified: Secondary | ICD-10-CM | POA: Diagnosis not present

## 2016-03-21 DIAGNOSIS — R8299 Other abnormal findings in urine: Secondary | ICD-10-CM | POA: Diagnosis not present

## 2016-03-21 DIAGNOSIS — I10 Essential (primary) hypertension: Secondary | ICD-10-CM | POA: Diagnosis not present

## 2016-03-21 DIAGNOSIS — Z7983 Long term (current) use of bisphosphonates: Secondary | ICD-10-CM | POA: Diagnosis not present

## 2016-03-21 DIAGNOSIS — E785 Hyperlipidemia, unspecified: Secondary | ICD-10-CM | POA: Diagnosis not present

## 2016-03-21 DIAGNOSIS — I6522 Occlusion and stenosis of left carotid artery: Secondary | ICD-10-CM | POA: Diagnosis not present

## 2016-04-06 ENCOUNTER — Other Ambulatory Visit (HOSPITAL_BASED_OUTPATIENT_CLINIC_OR_DEPARTMENT_OTHER): Payer: Medicare Other

## 2016-04-06 DIAGNOSIS — Z853 Personal history of malignant neoplasm of breast: Secondary | ICD-10-CM | POA: Diagnosis not present

## 2016-04-06 DIAGNOSIS — C50912 Malignant neoplasm of unspecified site of left female breast: Secondary | ICD-10-CM | POA: Diagnosis not present

## 2016-04-06 LAB — COMPREHENSIVE METABOLIC PANEL
ALBUMIN: 3.9 g/dL (ref 3.5–5.0)
ALK PHOS: 53 U/L (ref 40–150)
ALT: 20 U/L (ref 0–55)
ANION GAP: 9 meq/L (ref 3–11)
AST: 20 U/L (ref 5–34)
BILIRUBIN TOTAL: 0.43 mg/dL (ref 0.20–1.20)
BUN: 26 mg/dL (ref 7.0–26.0)
CALCIUM: 9.5 mg/dL (ref 8.4–10.4)
CO2: 28 mEq/L (ref 22–29)
Chloride: 105 mEq/L (ref 98–109)
Creatinine: 1.1 mg/dL (ref 0.6–1.1)
EGFR: 49 mL/min/{1.73_m2} — AB (ref >90)
Glucose: 93 mg/dl (ref 70–140)
Potassium: 4.1 mEq/L (ref 3.5–5.1)
Sodium: 142 mEq/L (ref 136–145)
TOTAL PROTEIN: 7.8 g/dL (ref 6.4–8.3)

## 2016-04-06 LAB — CBC WITH DIFFERENTIAL/PLATELET
BASO%: 1.3 % (ref 0.0–2.0)
Basophils Absolute: 0.1 10*3/uL (ref 0.0–0.1)
EOS ABS: 0.2 10*3/uL (ref 0.0–0.5)
EOS%: 2.8 % (ref 0.0–7.0)
HEMATOCRIT: 38.2 % (ref 34.8–46.6)
HEMOGLOBIN: 12.6 g/dL (ref 11.6–15.9)
LYMPH#: 1.2 10*3/uL (ref 0.9–3.3)
LYMPH%: 22.4 % (ref 14.0–49.7)
MCH: 31 pg (ref 25.1–34.0)
MCHC: 33 g/dL (ref 31.5–36.0)
MCV: 93.9 fL (ref 79.5–101.0)
MONO#: 0.5 10*3/uL (ref 0.1–0.9)
MONO%: 9.8 % (ref 0.0–14.0)
NEUT%: 63.7 % (ref 38.4–76.8)
NEUTROS ABS: 3.5 10*3/uL (ref 1.5–6.5)
PLATELETS: 157 10*3/uL (ref 145–400)
RBC: 4.07 10*6/uL (ref 3.70–5.45)
RDW: 14 % (ref 11.2–14.5)
WBC: 5.5 10*3/uL (ref 3.9–10.3)

## 2016-04-11 LAB — MULTIPLE MYELOMA PANEL, SERUM
ALPHA2 GLOB SERPL ELPH-MCNC: 0.7 g/dL (ref 0.4–1.0)
Albumin SerPl Elph-Mcnc: 4 g/dL (ref 2.9–4.4)
Albumin/Glob SerPl: 1.4 (ref 0.7–1.7)
Alpha 1: 0.2 g/dL (ref 0.0–0.4)
B-GLOBULIN SERPL ELPH-MCNC: 0.9 g/dL (ref 0.7–1.3)
GAMMA GLOB SERPL ELPH-MCNC: 1.3 g/dL (ref 0.4–1.8)
GLOBULIN, TOTAL: 3 g/dL (ref 2.2–3.9)
IgA, Qn, Serum: 87 mg/dL (ref 64–422)
IgM, Qn, Serum: 103 mg/dL (ref 26–217)
M PROTEIN SERPL ELPH-MCNC: 0.8 g/dL — AB
TOTAL PROTEIN: 7 g/dL (ref 6.0–8.5)

## 2016-04-12 NOTE — Assessment & Plan Note (Signed)
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 Osteoporosis: Takes Fosamax and calcium and vitamin D Otherwise patient denies any hot flashes or muscle aches. Patient will finish 5 years of therapy by November 2017. At that time she can stop antiestrogen therapy.  Breast Cancer Surveillance: 1. Breast exam 04/12/2016: Normal 2. Mammogram 12/31/2015: Postsurgical changes 3. MRI of the cervical spine 04/03/2015:no evidence of metastatic disease. But advanced multilevel cervical disc and facet degeneration with mild spinal stenosis at C6-C7 and mild foraminal stenosis (R though did not think surgery is necessary)

## 2016-04-12 NOTE — Assessment & Plan Note (Signed)
IgG Kappa: 0.8 g detected August 2017 (was 0.7 gm Aug 2016) There is no evidence of anemia or hypercalcemia, mild renal dysfunction is unlikely to be related to monoclonal protein. Based on lack of evidence of myeloma, I believe this is MGUS.  Recommendation: Monitor monoclonal protein along with CBC and CMP annually. I discussed with her in great detail the significance of this monoclonal protein and the risk of transformation to myeloma.  Return to clinic in 1 year with labs one week prior to the appointment.

## 2016-04-13 ENCOUNTER — Encounter: Payer: Self-pay | Admitting: Hematology and Oncology

## 2016-04-13 ENCOUNTER — Ambulatory Visit (HOSPITAL_BASED_OUTPATIENT_CLINIC_OR_DEPARTMENT_OTHER): Payer: Medicare Other | Admitting: Hematology and Oncology

## 2016-04-13 VITALS — BP 168/53 | HR 52 | Temp 98.4°F | Resp 18 | Ht 62.0 in | Wt 109.4 lb

## 2016-04-13 DIAGNOSIS — D472 Monoclonal gammopathy: Secondary | ICD-10-CM

## 2016-04-13 DIAGNOSIS — M810 Age-related osteoporosis without current pathological fracture: Secondary | ICD-10-CM | POA: Diagnosis not present

## 2016-04-13 DIAGNOSIS — C50512 Malignant neoplasm of lower-outer quadrant of left female breast: Secondary | ICD-10-CM | POA: Diagnosis not present

## 2016-04-13 NOTE — Progress Notes (Signed)
Patient Care Team: Antony Blackbird, MD as PCP - General (Family Medicine) Nicholas Lose, MD as Consulting Physician (Hematology and Oncology) Excell Seltzer, MD as Consulting Physician (General Surgery)  DIAGNOSIS: Breast cancer of lower-outer quadrant of left female breast [facility])   Staging form: Breast, AJCC 7th Edition   - Clinical: Stage IA (T1a, N0, M0) - Unsigned  SUMMARY OF ONCOLOGIC HISTORY:   Breast cancer of lower-outer quadrant of left female breast ([place])   02/21/2011 Surgery    Left lumpectomy: IDC grade 3, 0.48 cm, margins negative, DCIS high-grade focally near multiple margins, 0/1 lymph node, T1 1 N0 stage IA, ER 99%, PR 21%, Ki-67 62%, HER-2 negative ratio 1.19      04/11/2011 - 05/20/2011 Radiation Therapy    adjuvant radiation      06/10/2011 - 03/15/2014 Anti-estrogen oral therapy    anastrozole 1 mg daily       CHIEF COMPLIANT: Follow-up of MGUS and breast cancer  INTERVAL HISTORY: Tammie [name] is a 82 year old with above-mentioned history left breast cancer treated with lumpectomy, radiation and 3 years of antiestrogen therapy. She stopped taking it without any clear reason. She tells me she had previous and tolerated it fairly well. She is more concerned today about her blood sugars. Her hemoglobin A1c was 5.9. She denies any lumps or nodules in the breast. She'll very good energy levels. She had fallen twice in the last year and had broken her arm.  REVIEW OF SYSTEMS:   Constitutional: Denies fevers, chills or abnormal weight loss Eyes: Denies blurriness of vision Ears, nose, mouth, throat, and face: Denies mucositis or sore throat Respiratory: Denies cough, dyspnea or wheezes Cardiovascular: Denies palpitation, chest discomfort Gastrointestinal:  Denies nausea, heartburn or change in bowel habits Skin: Denies abnormal skin rashes Lymphatics: Denies new lymphadenopathy or easy bruising Neurological:Denies numbness, tingling or new  weaknesses Behavioral/Psych: Mood is stable, no new changes  Extremities: No lower extremity edema Breast:  denies any pain or lumps or nodules in either breasts All other systems were reviewed with the patient and are negative.  I have reviewed the past medical history, past surgical history, social history and family history with the patient and they are unchanged from previous note.  ALLERGIES:  has No Known Allergies.  MEDICATIONS:  Current Outpatient Prescriptions  Medication Sig Dispense Refill  . acetaminophen (TYLENOL) 325 MG tablet Take 2 tablets (650 mg total) by mouth every 6 (six) hours as needed for mild pain (or Fever >/= 101).    [name] alendronate (FOSAMAX) 70 MG tablet Take 1 tablet (70 mg total) by mouth once a week. Take with a full glass of water on an empty stomach. 12 tablet 1  . amLODipine (NORVASC) 10 MG tablet Take 10 mg by mouth daily.      [name] aspirin 325 MG EC tablet Take 325 mg by mouth daily.    . benazepril (LOTENSIN) 20 MG tablet Take 20 mg by mouth daily. Take 1 1/2 tab = 30 mg    . Cholecalciferol (VITAMIN D-3) 1000 UNITS CAPS Take 1 capsule by mouth daily.     . clopidogrel (PLAVIX) 75 MG tablet Take 75 mg by mouth at bedtime.     [name] FLUoxetine (PROZAC) 20 MG tablet Take 20 mg by mouth daily.      . furosemide (LASIX) 40 MG tablet Take 40 mg by mouth daily.     [name] levothyroxine (SYNTHROID, LEVOTHROID) 88 MCG tablet Take 88 mcg by mouth daily.    [name]  Multiple Vitamins-Minerals (CENTRUM SILVER PO) Take by mouth daily.    . simvastatin (ZOCOR) 20 MG tablet Take 20 mg by mouth at bedtime.      No current facility-administered medications for this visit.     PHYSICAL EXAMINATION: ECOG PERFORMANCE STATUS: 1 - Symptomatic but completely ambulatory  Vitals:   04/13/16 1339  BP: (!) 168/53  Pulse: (!) 52  Resp: 18  Temp: 98.4 F (36.9 C)   Filed Weights   04/13/16 1339  Weight: 109 lb 6.4 oz (49.6 kg)    GENERAL:alert, no distress and comfortable SKIN:  skin color, texture, turgor are normal, no rashes or significant lesions EYES: normal, Conjunctiva are pink and non-injected, sclera clear OROPHARYNX:no exudate, no erythema and lips, buccal mucosa, and tongue normal  NECK: supple, thyroid normal size, non-tender, without nodularity LYMPH:  no palpable lymphadenopathy in the cervical, axillary or inguinal LUNGS: clear to auscultation and percussion with normal breathing effort HEART: regular rate & rhythm and no murmurs and no lower extremity edema ABDOMEN:abdomen soft, non-tender and normal bowel sounds MUSCULOSKELETAL:no cyanosis of digits and no clubbing  NEURO: alert & oriented x 3 with fluent speech, no focal motor/sensory deficits EXTREMITIES: No lower extremity edema BREAST: No palpable masses or nodules in either right or left breasts. No palpable axillary supraclavicular or infraclavicular adenopathy no breast tenderness or nipple discharge. (exam performed in the presence of a chaperone)  LABORATORY DATA:  I have reviewed the data as listed   Chemistry      Component Value Date/Time   NA 142 04/06/2016 1039   K 4.1 04/06/2016 1039   CL 105 09/03/2013 0600   CL 103 01/18/2013 0918   CO2 28 04/06/2016 1039   BUN 26.0 04/06/2016 1039   CREATININE 1.1 04/06/2016 1039      Component Value Date/Time   CALCIUM 9.5 04/06/2016 1039   ALKPHOS 53 04/06/2016 1039   AST 20 04/06/2016 1039   ALT 20 04/06/2016 1039   BILITOT 0.43 04/06/2016 1039       Lab Results  Component Value Date   WBC 5.5 04/06/2016   HGB 12.6 04/06/2016   HCT 38.2 04/06/2016   MCV 93.9 04/06/2016   PLT 157 04/06/2016   NEUTROABS 3.5 04/06/2016     ASSESSMENT & PLAN:  Breast cancer of lower-outer quadrant of left female breast ([place]) Left lumpectomy 02/21/2011: IDC grade 3, 0.48 cm, margins negative, DCIS high-grade focally near multiple margins, 0/1 lymph node, T1 1 N0 stage IA, ER 99%, PR 21%, Ki-67 62%, HER-2 negative ratio 1.19, status post  radiation completed 05/21/2011, Arimidex 1 mg daily started 06/03/2011, osteoporosis on Fosamax  Arimidex toxicities: 1. Osteoporosis: Takes Fosamax and calcium and vitamin D Otherwise patient denies any hot flashes or muscle aches. Patient will finish 5 years of therapy by November 2017. At that time she can stop antiestrogen therapy.  Breast Cancer Surveillance: 1. Breast exam 04/12/2016: Normal 2. Mammogram 12/31/2015: Postsurgical changes 3. MRI of the cervical spine 04/03/2015:no evidence of metastatic disease. But advanced multilevel cervical disc and facet degeneration with mild spinal stenosis at C6-C7 and mild foraminal stenosis (R though did not think surgery is necessary)  MGUS (monoclonal gammopathy of unknown significance) IgG Kappa: 0.8 g detected August 2017 (was 0.7 gm Aug 2016) There is no evidence of anemia or hypercalcemia, mild renal dysfunction is unlikely to be related to monoclonal protein. Based on lack of evidence of myeloma, I believe this is MGUS.  Recommendation: Monitor monoclonal protein along with CBC  and CMP annually. I discussed with her in great detail the significance of this monoclonal protein and the risk of transformation to myeloma.  Return to clinic in 1 year with labs one week prior to the appointment.   Orders Placed This Encounter  Procedures  . CBC with Differential    Standing Status:   Future    Standing Expiration Date:   04/13/2017  . Comprehensive metabolic panel    Standing Status:   Future    Standing Expiration Date:   04/13/2017  . Multiple Myeloma Panel (SPEP&IFE w/QIG)    Standing Status:   Future    Standing Expiration Date:   05/18/2017  . Kappa/lambda light chains    Standing Status:   Future    Standing Expiration Date:   05/18/2017   The patient has a good understanding of the overall plan. she agrees with it. she will call with any problems that may develop before the next visit here.   Rulon Eisenmenger,  MD 04/13/16

## 2016-04-27 DIAGNOSIS — Z23 Encounter for immunization: Secondary | ICD-10-CM | POA: Diagnosis not present

## 2016-05-23 DIAGNOSIS — M810 Age-related osteoporosis without current pathological fracture: Secondary | ICD-10-CM | POA: Diagnosis not present

## 2016-05-23 DIAGNOSIS — R7303 Prediabetes: Secondary | ICD-10-CM | POA: Diagnosis not present

## 2016-05-23 DIAGNOSIS — I10 Essential (primary) hypertension: Secondary | ICD-10-CM | POA: Diagnosis not present

## 2016-05-23 DIAGNOSIS — E039 Hypothyroidism, unspecified: Secondary | ICD-10-CM | POA: Diagnosis not present

## 2016-05-23 DIAGNOSIS — E785 Hyperlipidemia, unspecified: Secondary | ICD-10-CM | POA: Diagnosis not present

## 2016-07-22 DIAGNOSIS — E039 Hypothyroidism, unspecified: Secondary | ICD-10-CM | POA: Diagnosis not present

## 2016-07-22 DIAGNOSIS — R7301 Impaired fasting glucose: Secondary | ICD-10-CM | POA: Diagnosis not present

## 2016-09-02 ENCOUNTER — Encounter: Payer: Self-pay | Admitting: Family

## 2016-09-08 ENCOUNTER — Ambulatory Visit (HOSPITAL_COMMUNITY)
Admission: RE | Admit: 2016-09-08 | Discharge: 2016-09-08 | Disposition: A | Discharge status: Home / Self Care | Payer: Medicare Other | Source: Ambulatory Visit | Attending: Family | Admitting: Family

## 2016-09-08 ENCOUNTER — Ambulatory Visit (INDEPENDENT_AMBULATORY_CARE_PROVIDER_SITE_OTHER): Payer: Medicare Other | Admitting: Family

## 2016-09-08 ENCOUNTER — Other Ambulatory Visit: Payer: Self-pay

## 2016-09-08 ENCOUNTER — Encounter: Payer: Self-pay | Admitting: Family

## 2016-09-08 VITALS — BP 158/64 | HR 47 | Temp 97.2°F | Resp 16 | Ht 62.0 in | Wt 107.0 lb

## 2016-09-08 DIAGNOSIS — I6523 Occlusion and stenosis of bilateral carotid arteries: Secondary | ICD-10-CM | POA: Insufficient documentation

## 2016-09-08 DIAGNOSIS — I6529 Occlusion and stenosis of unspecified carotid artery: Secondary | ICD-10-CM

## 2016-09-08 LAB — VAS US CAROTID
LCCAPDIAS: 14 cm/s
LCCAPSYS: 70 cm/s
LEFT ECA DIAS: -10 cm/s
LICADSYS: -120 cm/s
LICAPSYS: -212 cm/s
Left CCA dist dias: 17 cm/s
Left CCA dist sys: 76 cm/s
Left ICA dist dias: -25 cm/s
Left ICA prox dias: -47 cm/s
RCCAPDIAS: 9 cm/s
RIGHT CCA MID DIAS: 11 cm/s
RIGHT ECA DIAS: 0 cm/s
Right CCA prox sys: 73 cm/s
Right cca dist sys: -72 cm/s

## 2016-09-08 NOTE — Patient Instructions (Signed)
Stroke Prevention Some medical conditions and behaviors are associated with an increased chance of having a stroke. You may prevent a stroke by making healthy choices and managing medical conditions. How can I reduce my risk of having a stroke?  Stay physically active. Get at least 30 minutes of activity on most or all days.  Do not smoke. It may also be helpful to avoid exposure to secondhand smoke.  Limit alcohol use. Moderate alcohol use is considered to be:  No more than 2 drinks per day for men.  No more than 1 drink per day for nonpregnant women.  Eat healthy foods. This involves:  Eating 5 or more servings of fruits and vegetables a day.  Making dietary changes that address high blood pressure (hypertension), high cholesterol, diabetes, or obesity.  Manage your cholesterol levels.  Making food choices that are high in fiber and low in saturated fat, trans fat, and cholesterol may control cholesterol levels.  Take any prescribed medicines to control cholesterol as directed by your health care provider.  Manage your diabetes.  Controlling your carbohydrate and sugar intake is recommended to manage diabetes.  Take any prescribed medicines to control diabetes as directed by your health care provider.  Control your hypertension.  Making food choices that are low in salt (sodium), saturated fat, trans fat, and cholesterol is recommended to manage hypertension.  Ask your health care provider if you need treatment to lower your blood pressure. Take any prescribed medicines to control hypertension as directed by your health care provider.  If you are 18-39 years of age, have your blood pressure checked every 3-5 years. If you are 40 years of age or older, have your blood pressure checked every year.  Maintain a healthy weight.  Reducing calorie intake and making food choices that are low in sodium, saturated fat, trans fat, and cholesterol are recommended to manage  weight.  Stop drug abuse.  Avoid taking birth control pills.  Talk to your health care provider about the risks of taking birth control pills if you are over 35 years old, smoke, get migraines, or have ever had a blood clot.  Get evaluated for sleep disorders (sleep apnea).  Talk to your health care provider about getting a sleep evaluation if you snore a lot or have excessive sleepiness.  Take medicines only as directed by your health care provider.  For some people, aspirin or blood thinners (anticoagulants) are helpful in reducing the risk of forming abnormal blood clots that can lead to stroke. If you have the irregular heart rhythm of atrial fibrillation, you should be on a blood thinner unless there is a good reason you cannot take them.  Understand all your medicine instructions.  Make sure that other conditions (such as anemia or atherosclerosis) are addressed. Get help right away if:  You have sudden weakness or numbness of the face, arm, or leg, especially on one side of the body.  Your face or eyelid droops to one side.  You have sudden confusion.  You have trouble speaking (aphasia) or understanding.  You have sudden trouble seeing in one or both eyes.  You have sudden trouble walking.  You have dizziness.  You have a loss of balance or coordination.  You have a sudden, severe headache with no known cause.  You have new chest pain or an irregular heartbeat. Any of these symptoms may represent a serious problem that is an emergency. Do not wait to see if the symptoms will go away.   Get medical help at once. Call your local emergency services (911 in [place] Do not drive yourself to the hospital. This information is not intended to replace advice given to you by your health care provider. Make sure you discuss any questions you have with your health care provider. Document Released: 08/25/2004 Document Revised: 12/24/2015 Document Reviewed: 01/18/2013 Elsevier  Interactive Patient Education  2017 Elsevier Inc.  

## 2016-09-08 NOTE — Progress Notes (Signed)
Chief Complaint: Follow up Extracranial Carotid Artery Stenosis   History of Present Illness  Tammie [name] is a 83 y.o. female patient of Dr. Oneida Alar followed for known carotid artery stenosis.   She has not had previous carotid artery intervention.  She has no history of TIA or stroke symptoms.Specifically the patient denies a history of amaurosis fugax or monocular blindness, unilateral facial drooping, hemiparesis, or receptive or expressive aphasia.   She denies claudication symptoms with walking.   Pt states that her oncologist increased her ASA dose from 81 to 325 mg daily.  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   She fell and fractured right hip January, 2015, was surgically repaired, states [facility] was excellent. She states her right leg is an inch shorter since a childhood accident, has a shoe insert for her right foot.  Pt Diabetic: states she was prediabetic until she improved her diet,  she lost weight as a result of this. 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 Bilateral carotid artery stenosis    hx  . Breast cancer T1aN0 S/P lumpectomy SLN Bx 01/2011 03/25/2011  . Breast lump    left, benign  . Cancer ([place])    breast  . Depression   . Fall from grocery cart Jan. 31, 2015   Fx  Right  Hip  . GERD (gastroesophageal reflux disease)   . History of ETOH abuse    member [facility] 22.5 years  . Hyperlipidemia   . Hypertension   . Lumbar herniated disc   . Myocardial infarction 1999   mi  . Thyroid disease    hypo    Social History Social History  Substance Use Topics  . Smoking status: Former Smoker    Types: Cigarettes    Quit date: 09/29/2004  . Smokeless tobacco: Never Used     Comment: 2007- quit  . Alcohol use No     Comment: hx of EtOHism - in [facility] x 27 years    Family  History Family History  Problem Relation Age of Onset  . Heart attack Mother   . Hypertension Mother   . Hyperlipidemia Mother   . Heart attack Father   . Hypertension Father   . Hyperlipidemia Father   . Breast cancer Paternal Aunt     Surgical History Past Surgical History:  Procedure Laterality Date  . BREAST BIOPSY  200, 88  . BREAST BIOPSY  1985/2000   2 BENIGN LEFT BREAST BXS  . BREAST LUMPECTOMY Left 2012   bening  . FEMUR IM NAIL Right 09/01/2013   Procedure: INTRAMEDULLARY (IM) NAIL FEMORAL;  Surgeon: [name], MD;  Location: [facility];  Service: Orthopedics;  Laterality: Right;  . FRACTURE SURGERY Right Jan. 31, 2015   Hip    No Known Allergies  Current Outpatient Prescriptions  Medication Sig Dispense Refill  . acetaminophen (TYLENOL) 325 MG tablet Take 2 tablets (650 mg total) by mouth every 6 (six) hours as needed for mild pain (or Fever >/= 101).    [name] alendronate (FOSAMAX) 70 MG tablet Take 1 tablet (70 mg total) by mouth once a week. Take with a full glass of water on an empty stomach. 12 tablet 1  . amLODipine (NORVASC) 10 MG tablet Take 10 mg by mouth daily.      [name]  aspirin 325 MG EC tablet Take 325 mg by mouth daily.    . benazepril (LOTENSIN) 20 MG tablet Take 20 mg by mouth daily. Take 1 1/2 tab = 30 mg    . calcium carbonate (OSCAL) 1500 (600 Ca) MG TABS tablet Take by mouth 2 (two) times daily with a meal.    . Cholecalciferol (VITAMIN D-3) 1000 UNITS CAPS Take 1 capsule by mouth daily.     . clopidogrel (PLAVIX) 75 MG tablet Take 75 mg by mouth at bedtime.     [name] FLUoxetine (PROZAC) 20 MG tablet Take 20 mg by mouth daily.      . furosemide (LASIX) 40 MG tablet Take 40 mg by mouth daily.     [name] levothyroxine (SYNTHROID, LEVOTHROID) 88 MCG tablet Take 88 mcg by mouth daily.    . Multiple Vitamins-Minerals (CENTRUM SILVER PO) Take by mouth daily.    . simvastatin (ZOCOR) 20 MG tablet Take 20 mg by mouth at bedtime.      No current facility-administered  medications for this visit.     Review of Systems : See HPI for pertinent positives and negatives.  Physical Examination  Vitals:   09/08/16 1015 09/08/16 1019  BP: (!) 159/58 (!) 158/64  Pulse: (!) 47   Resp: 16   Temp: 97.2 F (36.2 C)   TempSrc: Oral   SpO2: 99%   Weight: 107 lb (48.5 kg)   Height: 5\' 2"  (1.575 m)    Body mass index is 19.57 kg/m.  General: WDWN female in NAD GAIT: right leg shorter Eyes: PERRLA Pulmonary: Respirations are non-labored, CTAB, no rales, rhonchi, or wheezing.  Cardiac: regular rhythm, no detected murmur.  VASCULAR EXAM Carotid Bruits Left Right   positive positive    Radial pulses are 2+ palpable and equal.      LE Pulses LEFT RIGHT   POPLITEAL not palpable  not palpable   POSTERIOR TIBIAL not palpable  not palpable    DORSALIS PEDIS  ANTERIOR TIBIAL palpable  palpable     Gastrointestinal: soft, nontender, BS WNL, no r/g,no palpable masses.  Musculoskeletal: Negative muscle atrophy/wasting. M/S 4/5 in UE's, 4/5 in LE's, Extremities without ischemic changes. Wrist splint on right wrist.  Neurologic: A&O X 3; Appropriate Affect , normal sensation,  Speech is normal CN 2-12 intact except some hearing loss, Pain and light touch intact in extremities, Motor exam as listed above.    Assessment: Tammie [name] is a 83 y.o. female who has no history of stroke or TIA.  DATA Today's carotid duplex suggests <40% right ICA stenosis and 40 - 59 % left ICA stenosis. Bilateral ECA with >50% diameter reduction. Bilateral vertebral artery flow is antegrade.  Bilateral subclavian artery waveforms are normal.  No significant change compared to exams of 02-26-15 and 09-03-15.   Plan:  Referral to PCP, internist, as Dr. Chapman Fitch left, must accept Medicare.    Follow-up in 1 year with Carotid Duplex scan.    I discussed in depth with the patient the nature of atherosclerosis, and emphasized the importance of maximal medical management including strict control of blood pressure, blood glucose, and lipid levels, obtaining regular exercise, and continued cessation of smoking.  The patient is aware that without maximal medical management the underlying atherosclerotic disease process will progress, limiting the benefit of any interventions. The patient was given information about stroke prevention and what symptoms should prompt the patient to seek immediate medical care. Thank you for allowing [place] to participate in this  patient's care.  Clemon Chambers, RN, MSN, FNP-C [facility] Office: [phone]  Clinic Physician: Bridgett Larsson on call  09/08/16 11:04 AM

## 2016-10-21 DIAGNOSIS — M87180 Osteonecrosis due to drugs, jaw: Secondary | ICD-10-CM | POA: Diagnosis not present

## 2016-10-21 DIAGNOSIS — E039 Hypothyroidism, unspecified: Secondary | ICD-10-CM | POA: Diagnosis not present

## 2016-10-21 DIAGNOSIS — M810 Age-related osteoporosis without current pathological fracture: Secondary | ICD-10-CM | POA: Diagnosis not present

## 2016-10-21 DIAGNOSIS — I10 Essential (primary) hypertension: Secondary | ICD-10-CM | POA: Diagnosis not present

## 2016-11-08 DIAGNOSIS — S51801A Unspecified open wound of right forearm, initial encounter: Secondary | ICD-10-CM | POA: Diagnosis not present

## 2016-11-09 DIAGNOSIS — S51802A Unspecified open wound of left forearm, initial encounter: Secondary | ICD-10-CM | POA: Diagnosis not present

## 2016-11-09 DIAGNOSIS — W5501XD Bitten by cat, subsequent encounter: Secondary | ICD-10-CM | POA: Diagnosis not present

## 2016-11-24 DIAGNOSIS — M810 Age-related osteoporosis without current pathological fracture: Secondary | ICD-10-CM | POA: Diagnosis not present

## 2016-11-24 DIAGNOSIS — E2839 Other primary ovarian failure: Secondary | ICD-10-CM | POA: Diagnosis not present

## 2017-01-02 ENCOUNTER — Other Ambulatory Visit: Payer: Self-pay | Admitting: Internal Medicine

## 2017-01-02 DIAGNOSIS — M810 Age-related osteoporosis without current pathological fracture: Secondary | ICD-10-CM | POA: Diagnosis not present

## 2017-01-02 DIAGNOSIS — Z853 Personal history of malignant neoplasm of breast: Secondary | ICD-10-CM

## 2017-01-02 DIAGNOSIS — E785 Hyperlipidemia, unspecified: Secondary | ICD-10-CM | POA: Diagnosis not present

## 2017-01-02 DIAGNOSIS — E039 Hypothyroidism, unspecified: Secondary | ICD-10-CM | POA: Diagnosis not present

## 2017-01-02 DIAGNOSIS — I10 Essential (primary) hypertension: Secondary | ICD-10-CM | POA: Diagnosis not present

## 2017-01-11 ENCOUNTER — Ambulatory Visit
Admission: RE | Admit: 2017-01-11 | Discharge: 2017-01-11 | Disposition: A | Discharge status: Home / Self Care | Payer: Medicare Other | Source: Ambulatory Visit | Attending: Internal Medicine | Admitting: Internal Medicine

## 2017-01-11 DIAGNOSIS — Z853 Personal history of malignant neoplasm of breast: Secondary | ICD-10-CM

## 2017-01-11 DIAGNOSIS — R928 Other abnormal and inconclusive findings on diagnostic imaging of breast: Secondary | ICD-10-CM | POA: Diagnosis not present

## 2017-01-13 DIAGNOSIS — M810 Age-related osteoporosis without current pathological fracture: Secondary | ICD-10-CM | POA: Diagnosis not present

## 2017-01-13 DIAGNOSIS — R5383 Other fatigue: Secondary | ICD-10-CM | POA: Diagnosis not present

## 2017-01-13 DIAGNOSIS — E559 Vitamin D deficiency, unspecified: Secondary | ICD-10-CM | POA: Diagnosis not present

## 2017-01-27 DIAGNOSIS — E559 Vitamin D deficiency, unspecified: Secondary | ICD-10-CM | POA: Diagnosis not present

## 2017-01-27 DIAGNOSIS — M810 Age-related osteoporosis without current pathological fracture: Secondary | ICD-10-CM | POA: Diagnosis not present

## 2017-01-31 DIAGNOSIS — L03116 Cellulitis of left lower limb: Secondary | ICD-10-CM | POA: Diagnosis not present

## 2017-02-02 DIAGNOSIS — L03116 Cellulitis of left lower limb: Secondary | ICD-10-CM | POA: Diagnosis not present

## 2017-02-04 DIAGNOSIS — S80812A Abrasion, left lower leg, initial encounter: Secondary | ICD-10-CM | POA: Diagnosis not present

## 2017-02-06 DIAGNOSIS — M79662 Pain in left lower leg: Secondary | ICD-10-CM | POA: Diagnosis not present

## 2017-02-06 DIAGNOSIS — S80812D Abrasion, left lower leg, subsequent encounter: Secondary | ICD-10-CM | POA: Diagnosis not present

## 2017-02-09 DIAGNOSIS — L02416 Cutaneous abscess of left lower limb: Secondary | ICD-10-CM | POA: Diagnosis not present

## 2017-02-11 DIAGNOSIS — L02416 Cutaneous abscess of left lower limb: Secondary | ICD-10-CM | POA: Diagnosis not present

## 2017-02-14 DIAGNOSIS — L02416 Cutaneous abscess of left lower limb: Secondary | ICD-10-CM | POA: Diagnosis not present

## 2017-02-21 DIAGNOSIS — H04123 Dry eye syndrome of bilateral lacrimal glands: Secondary | ICD-10-CM | POA: Diagnosis not present

## 2017-02-21 DIAGNOSIS — H25813 Combined forms of age-related cataract, bilateral: Secondary | ICD-10-CM | POA: Diagnosis not present

## 2017-02-22 ENCOUNTER — Encounter (HOSPITAL_BASED_OUTPATIENT_CLINIC_OR_DEPARTMENT_OTHER): Payer: Medicare Other | Attending: Surgery

## 2017-02-22 DIAGNOSIS — Z923 Personal history of irradiation: Secondary | ICD-10-CM | POA: Diagnosis not present

## 2017-02-22 DIAGNOSIS — Z87891 Personal history of nicotine dependence: Secondary | ICD-10-CM | POA: Insufficient documentation

## 2017-02-22 DIAGNOSIS — Z853 Personal history of malignant neoplasm of breast: Secondary | ICD-10-CM | POA: Insufficient documentation

## 2017-02-22 DIAGNOSIS — S81812A Laceration without foreign body, left lower leg, initial encounter: Secondary | ICD-10-CM | POA: Diagnosis not present

## 2017-02-22 DIAGNOSIS — I10 Essential (primary) hypertension: Secondary | ICD-10-CM | POA: Insufficient documentation

## 2017-02-22 DIAGNOSIS — W5501XA Bitten by cat, initial encounter: Secondary | ICD-10-CM | POA: Insufficient documentation

## 2017-02-22 DIAGNOSIS — Z9221 Personal history of antineoplastic chemotherapy: Secondary | ICD-10-CM | POA: Diagnosis not present

## 2017-02-22 DIAGNOSIS — S81802A Unspecified open wound, left lower leg, initial encounter: Secondary | ICD-10-CM | POA: Diagnosis not present

## 2017-03-01 ENCOUNTER — Encounter (HOSPITAL_BASED_OUTPATIENT_CLINIC_OR_DEPARTMENT_OTHER): Payer: Medicare Other | Attending: Surgery

## 2017-03-01 DIAGNOSIS — Z87891 Personal history of nicotine dependence: Secondary | ICD-10-CM | POA: Diagnosis not present

## 2017-03-01 DIAGNOSIS — I10 Essential (primary) hypertension: Secondary | ICD-10-CM | POA: Diagnosis not present

## 2017-03-01 DIAGNOSIS — Z923 Personal history of irradiation: Secondary | ICD-10-CM | POA: Diagnosis not present

## 2017-03-01 DIAGNOSIS — L97222 Non-pressure chronic ulcer of left calf with fat layer exposed: Secondary | ICD-10-CM | POA: Insufficient documentation

## 2017-03-01 DIAGNOSIS — Z853 Personal history of malignant neoplasm of breast: Secondary | ICD-10-CM | POA: Diagnosis not present

## 2017-03-01 DIAGNOSIS — Z9221 Personal history of antineoplastic chemotherapy: Secondary | ICD-10-CM | POA: Diagnosis not present

## 2017-03-01 DIAGNOSIS — S81852A Open bite, left lower leg, initial encounter: Secondary | ICD-10-CM | POA: Diagnosis not present

## 2017-03-06 DIAGNOSIS — S81802A Unspecified open wound, left lower leg, initial encounter: Secondary | ICD-10-CM | POA: Diagnosis not present

## 2017-03-08 DIAGNOSIS — S81812A Laceration without foreign body, left lower leg, initial encounter: Secondary | ICD-10-CM | POA: Diagnosis not present

## 2017-03-08 DIAGNOSIS — L97222 Non-pressure chronic ulcer of left calf with fat layer exposed: Secondary | ICD-10-CM | POA: Diagnosis not present

## 2017-03-15 DIAGNOSIS — L97222 Non-pressure chronic ulcer of left calf with fat layer exposed: Secondary | ICD-10-CM | POA: Diagnosis not present

## 2017-03-15 DIAGNOSIS — S81852A Open bite, left lower leg, initial encounter: Secondary | ICD-10-CM | POA: Diagnosis not present

## 2017-03-22 DIAGNOSIS — L97222 Non-pressure chronic ulcer of left calf with fat layer exposed: Secondary | ICD-10-CM | POA: Diagnosis not present

## 2017-03-22 DIAGNOSIS — S81852A Open bite, left lower leg, initial encounter: Secondary | ICD-10-CM | POA: Diagnosis not present

## 2017-03-29 DIAGNOSIS — S81812A Laceration without foreign body, left lower leg, initial encounter: Secondary | ICD-10-CM | POA: Diagnosis not present

## 2017-03-29 DIAGNOSIS — L97222 Non-pressure chronic ulcer of left calf with fat layer exposed: Secondary | ICD-10-CM | POA: Diagnosis not present

## 2017-04-05 ENCOUNTER — Encounter (HOSPITAL_BASED_OUTPATIENT_CLINIC_OR_DEPARTMENT_OTHER): Payer: Medicare Other | Attending: Surgery

## 2017-04-05 DIAGNOSIS — Z853 Personal history of malignant neoplasm of breast: Secondary | ICD-10-CM | POA: Insufficient documentation

## 2017-04-05 DIAGNOSIS — I10 Essential (primary) hypertension: Secondary | ICD-10-CM | POA: Insufficient documentation

## 2017-04-05 DIAGNOSIS — Z87891 Personal history of nicotine dependence: Secondary | ICD-10-CM | POA: Diagnosis not present

## 2017-04-05 DIAGNOSIS — L97822 Non-pressure chronic ulcer of other part of left lower leg with fat layer exposed: Secondary | ICD-10-CM | POA: Diagnosis not present

## 2017-04-05 DIAGNOSIS — S81812A Laceration without foreign body, left lower leg, initial encounter: Secondary | ICD-10-CM | POA: Diagnosis not present

## 2017-04-05 DIAGNOSIS — Z923 Personal history of irradiation: Secondary | ICD-10-CM | POA: Insufficient documentation

## 2017-04-05 DIAGNOSIS — Z9221 Personal history of antineoplastic chemotherapy: Secondary | ICD-10-CM | POA: Insufficient documentation

## 2017-04-06 ENCOUNTER — Other Ambulatory Visit: Payer: Medicare Other

## 2017-04-13 ENCOUNTER — Ambulatory Visit: Payer: Medicare Other | Admitting: Hematology and Oncology

## 2017-04-13 DIAGNOSIS — S81852A Open bite, left lower leg, initial encounter: Secondary | ICD-10-CM | POA: Diagnosis not present

## 2017-04-13 DIAGNOSIS — L97822 Non-pressure chronic ulcer of other part of left lower leg with fat layer exposed: Secondary | ICD-10-CM | POA: Diagnosis not present

## 2017-04-13 NOTE — Assessment & Plan Note (Deleted)
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 Osteoporosis: Takes Fosamax and calcium and vitamin D Otherwise patient denies any hot flashes or muscle aches. Patient will finish 5 years of therapy by November 2017. At that time she can stop antiestrogen therapy.  Breast Cancer Surveillance: 1. Breast exam  04/30/2017: Normal 2. Mammogram  01/11/2017: Postsurgical changes 3. MRI of the cervical spine 04/03/2015:no evidence of metastatic disease.   return to clinic in one year with survivorship

## 2017-04-18 DIAGNOSIS — L97822 Non-pressure chronic ulcer of other part of left lower leg with fat layer exposed: Secondary | ICD-10-CM | POA: Diagnosis not present

## 2017-04-18 DIAGNOSIS — S81812A Laceration without foreign body, left lower leg, initial encounter: Secondary | ICD-10-CM | POA: Diagnosis not present

## 2017-04-24 DIAGNOSIS — H25811 Combined forms of age-related cataract, right eye: Secondary | ICD-10-CM | POA: Diagnosis not present

## 2017-04-24 DIAGNOSIS — H2511 Age-related nuclear cataract, right eye: Secondary | ICD-10-CM | POA: Diagnosis not present

## 2017-05-02 ENCOUNTER — Encounter (HOSPITAL_BASED_OUTPATIENT_CLINIC_OR_DEPARTMENT_OTHER): Payer: Medicare Other | Attending: Surgery

## 2017-05-02 DIAGNOSIS — L97822 Non-pressure chronic ulcer of other part of left lower leg with fat layer exposed: Secondary | ICD-10-CM | POA: Diagnosis present

## 2017-05-02 DIAGNOSIS — Z923 Personal history of irradiation: Secondary | ICD-10-CM | POA: Insufficient documentation

## 2017-05-02 DIAGNOSIS — Z87891 Personal history of nicotine dependence: Secondary | ICD-10-CM | POA: Insufficient documentation

## 2017-05-02 DIAGNOSIS — Z9221 Personal history of antineoplastic chemotherapy: Secondary | ICD-10-CM | POA: Diagnosis not present

## 2017-05-02 DIAGNOSIS — I10 Essential (primary) hypertension: Secondary | ICD-10-CM | POA: Insufficient documentation

## 2017-05-02 DIAGNOSIS — S81852A Open bite, left lower leg, initial encounter: Secondary | ICD-10-CM | POA: Diagnosis not present

## 2017-05-02 DIAGNOSIS — Z853 Personal history of malignant neoplasm of breast: Secondary | ICD-10-CM | POA: Insufficient documentation

## 2017-05-04 DIAGNOSIS — H2512 Age-related nuclear cataract, left eye: Secondary | ICD-10-CM | POA: Diagnosis not present

## 2017-05-08 DIAGNOSIS — H2512 Age-related nuclear cataract, left eye: Secondary | ICD-10-CM | POA: Diagnosis not present

## 2017-05-08 DIAGNOSIS — H268 Other specified cataract: Secondary | ICD-10-CM | POA: Diagnosis not present

## 2017-05-23 DIAGNOSIS — L97822 Non-pressure chronic ulcer of other part of left lower leg with fat layer exposed: Secondary | ICD-10-CM | POA: Diagnosis not present

## 2017-05-23 DIAGNOSIS — S81852A Open bite, left lower leg, initial encounter: Secondary | ICD-10-CM | POA: Diagnosis not present

## 2017-06-06 ENCOUNTER — Encounter (HOSPITAL_BASED_OUTPATIENT_CLINIC_OR_DEPARTMENT_OTHER): Payer: Medicare Other | Attending: Surgery

## 2017-06-06 DIAGNOSIS — Z872 Personal history of diseases of the skin and subcutaneous tissue: Secondary | ICD-10-CM | POA: Insufficient documentation

## 2017-06-06 DIAGNOSIS — Z09 Encounter for follow-up examination after completed treatment for conditions other than malignant neoplasm: Secondary | ICD-10-CM | POA: Insufficient documentation

## 2017-06-06 DIAGNOSIS — S81852A Open bite, left lower leg, initial encounter: Secondary | ICD-10-CM | POA: Diagnosis not present

## 2017-06-19 DIAGNOSIS — Z961 Presence of intraocular lens: Secondary | ICD-10-CM | POA: Diagnosis not present

## 2017-07-05 ENCOUNTER — Other Ambulatory Visit: Payer: Self-pay | Admitting: Internal Medicine

## 2017-07-05 ENCOUNTER — Ambulatory Visit
Admission: RE | Admit: 2017-07-05 | Discharge: 2017-07-05 | Disposition: A | Discharge status: Home / Self Care | Payer: Medicare Other | Source: Ambulatory Visit | Attending: Internal Medicine | Admitting: Internal Medicine

## 2017-07-05 DIAGNOSIS — M25552 Pain in left hip: Secondary | ICD-10-CM | POA: Diagnosis not present

## 2017-07-05 DIAGNOSIS — E039 Hypothyroidism, unspecified: Secondary | ICD-10-CM | POA: Diagnosis not present

## 2017-07-05 DIAGNOSIS — S79912A Unspecified injury of left hip, initial encounter: Secondary | ICD-10-CM

## 2017-07-05 DIAGNOSIS — W5501XA Bitten by cat, initial encounter: Secondary | ICD-10-CM | POA: Diagnosis not present

## 2017-07-05 DIAGNOSIS — S51851A Open bite of right forearm, initial encounter: Secondary | ICD-10-CM | POA: Diagnosis not present

## 2017-07-06 ENCOUNTER — Other Ambulatory Visit: Payer: Self-pay | Admitting: Internal Medicine

## 2017-07-06 DIAGNOSIS — R413 Other amnesia: Secondary | ICD-10-CM

## 2017-07-07 ENCOUNTER — Ambulatory Visit
Admission: RE | Admit: 2017-07-07 | Discharge: 2017-07-07 | Disposition: A | Discharge status: Home / Self Care | Payer: Medicare Other | Source: Ambulatory Visit | Attending: Internal Medicine | Admitting: Internal Medicine

## 2017-07-07 DIAGNOSIS — R413 Other amnesia: Secondary | ICD-10-CM

## 2017-07-07 MED ORDER — IOPAMIDOL (ISOVUE-300) INJECTION 61%
75.0000 mL | Freq: Once | INTRAVENOUS | Status: AC | PRN
  Administered 2017-07-07: 75 mL via INTRAVENOUS

## 2017-07-08 DIAGNOSIS — S79912D Unspecified injury of left hip, subsequent encounter: Secondary | ICD-10-CM | POA: Diagnosis not present

## 2017-07-08 DIAGNOSIS — N183 Chronic kidney disease, stage 3 (moderate): Secondary | ICD-10-CM | POA: Diagnosis not present

## 2017-07-08 DIAGNOSIS — I671 Cerebral aneurysm, nonruptured: Secondary | ICD-10-CM | POA: Diagnosis not present

## 2017-07-08 DIAGNOSIS — I129 Hypertensive chronic kidney disease with stage 1 through stage 4 chronic kidney disease, or unspecified chronic kidney disease: Secondary | ICD-10-CM | POA: Diagnosis not present

## 2017-07-08 DIAGNOSIS — R4189 Other symptoms and signs involving cognitive functions and awareness: Secondary | ICD-10-CM | POA: Diagnosis not present

## 2017-07-13 ENCOUNTER — Other Ambulatory Visit: Payer: Self-pay | Admitting: Internal Medicine

## 2017-07-13 DIAGNOSIS — I671 Cerebral aneurysm, nonruptured: Secondary | ICD-10-CM

## 2017-07-22 ENCOUNTER — Ambulatory Visit
Admission: RE | Admit: 2017-07-22 | Discharge: 2017-07-22 | Disposition: A | Discharge status: Home / Self Care | Payer: Medicare Other | Source: Ambulatory Visit | Attending: Internal Medicine | Admitting: Internal Medicine

## 2017-07-22 DIAGNOSIS — G9389 Other specified disorders of brain: Secondary | ICD-10-CM | POA: Diagnosis not present

## 2017-07-22 DIAGNOSIS — I671 Cerebral aneurysm, nonruptured: Secondary | ICD-10-CM

## 2017-08-02 DIAGNOSIS — I671 Cerebral aneurysm, nonruptured: Secondary | ICD-10-CM | POA: Diagnosis not present

## 2017-08-02 DIAGNOSIS — E039 Hypothyroidism, unspecified: Secondary | ICD-10-CM | POA: Diagnosis not present

## 2017-08-02 DIAGNOSIS — R4189 Other symptoms and signs involving cognitive functions and awareness: Secondary | ICD-10-CM | POA: Diagnosis not present

## 2017-08-02 DIAGNOSIS — I10 Essential (primary) hypertension: Secondary | ICD-10-CM | POA: Diagnosis not present

## 2017-08-21 DIAGNOSIS — I671 Cerebral aneurysm, nonruptured: Secondary | ICD-10-CM | POA: Diagnosis not present

## 2017-08-21 DIAGNOSIS — I10 Essential (primary) hypertension: Secondary | ICD-10-CM | POA: Diagnosis not present

## 2017-08-21 DIAGNOSIS — Z681 Body mass index (BMI) 19 or less, adult: Secondary | ICD-10-CM | POA: Diagnosis not present

## 2017-09-14 ENCOUNTER — Encounter: Payer: Self-pay | Admitting: Family

## 2017-09-14 ENCOUNTER — Ambulatory Visit (HOSPITAL_COMMUNITY)
Admission: RE | Admit: 2017-09-14 | Discharge: 2017-09-14 | Disposition: A | Discharge status: Home / Self Care | Payer: Medicare Other | Source: Ambulatory Visit | Attending: Family | Admitting: Family

## 2017-09-14 ENCOUNTER — Ambulatory Visit (INDEPENDENT_AMBULATORY_CARE_PROVIDER_SITE_OTHER): Payer: Medicare Other | Admitting: Family

## 2017-09-14 ENCOUNTER — Other Ambulatory Visit: Payer: Self-pay

## 2017-09-14 VITALS — BP 143/62 | HR 54 | Temp 98.0°F | Resp 16 | Ht 62.0 in | Wt 109.3 lb

## 2017-09-14 DIAGNOSIS — I6529 Occlusion and stenosis of unspecified carotid artery: Secondary | ICD-10-CM

## 2017-09-14 DIAGNOSIS — I6523 Occlusion and stenosis of bilateral carotid arteries: Secondary | ICD-10-CM | POA: Diagnosis not present

## 2017-09-14 DIAGNOSIS — R609 Edema, unspecified: Secondary | ICD-10-CM | POA: Diagnosis not present

## 2017-09-14 LAB — VAS US CAROTID
LCCADDIAS: 9 cm/s
LCCADSYS: 70 cm/s
LCCAPDIAS: 9 cm/s
LEFT ECA DIAS: -52 cm/s
LEFT VERTEBRAL DIAS: 7 cm/s
LICADSYS: -114 cm/s
LICAPDIAS: -34 cm/s
Left CCA prox sys: 67 cm/s
Left ICA dist dias: -16 cm/s
Left ICA prox sys: -198 cm/s
RCCAPDIAS: 8 cm/s
RCCAPSYS: 70 cm/s
RIGHT CCA MID DIAS: 11 cm/s
RIGHT VERTEBRAL DIAS: 14 cm/s
Right cca dist sys: -102 cm/s

## 2017-09-14 NOTE — Progress Notes (Signed)
Chief Complaint: Follow up Extracranial Carotid Artery Stenosis   History of Present Illness  Tammie [name] is a 84 y.o. female whom Dr. Oneida Alar f has been monitoring for known carotid artery stenosis.   She has not had previous carotid artery intervention.  She has no history of TIA or stroke symptoms.Specifically she denies a history of amaurosis fugax or monocular blindness, unilateral facial drooping, hemiparesis, or receptive or expressive aphasia.   She denies claudication symptoms with walking.   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   She fell and fractured right hip January, 2015, was surgically repaired, states [facility] was excellent. She states her right leg is an inch shorter since a childhood accident, has a shoe insert for her right foot.  Pt Diabetic: states she was prediabetic until she improved her diet, she lost weight as a result of this. 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 Bilateral carotid artery stenosis    hx  . Breast cancer T1aN0 S/P lumpectomy SLN Bx 01/2011 03/25/2011  . Breast lump    left, benign  . Cancer ([place])    breast  . Depression   . Fall from grocery cart Jan. 31, 2015   Fx  Right  Hip  . GERD (gastroesophageal reflux disease)   . History of ETOH abuse    member [facility] 22.5 years  . Hyperlipidemia   . Hypertension   . Lumbar herniated disc   . Myocardial infarction ([place]) 1999   mi  . Thyroid disease    hypo    Social History Social History   Tobacco Use  . Smoking status: Former Smoker    Types: Cigarettes    Last attempt to quit: 09/29/2004    Years since quitting: 12.9  . Smokeless tobacco: Never Used  . Tobacco comment: 2007- quit  Substance Use Topics  . Alcohol use: No    Alcohol/week: 0.0 oz    Comment: hx of EtOHism - in [facility] x 27 years   . Drug use: No    Family History Family History  Problem Relation Age of Onset  . Heart attack Mother   . Hypertension Mother   . Hyperlipidemia Mother   . Heart attack Father   . Hypertension Father   . Hyperlipidemia Father   . Breast cancer Paternal Aunt     Surgical History Past Surgical History:  Procedure Laterality Date  . BREAST BIOPSY  200, 88  . BREAST BIOPSY  1985/2000   2 BENIGN LEFT BREAST BXS  . BREAST LUMPECTOMY Left 2012   bening  . FEMUR IM NAIL Right 09/01/2013   Procedure: INTRAMEDULLARY (IM) NAIL FEMORAL;  Surgeon: [name], MD;  Location: [facility];  Service: Orthopedics;  Laterality: Right;  . FRACTURE SURGERY Right Jan. 31, 2015   Hip    No Known Allergies  Current Outpatient Medications  Medication Sig Dispense Refill  . amLODipine (NORVASC) 10 MG tablet Take 10 mg by mouth daily.      . benazepril (LOTENSIN) 20 MG tablet Take 20 mg by mouth daily. Take 1 1/2 tab = 30 mg    . clopidogrel (PLAVIX) 75 MG tablet Take 75 mg by mouth at bedtime.     [name] levothyroxine (SYNTHROID, LEVOTHROID) 88 MCG tablet Take 88 mcg by mouth daily.    [name]  alendronate (FOSAMAX) 70 MG tablet Take 1 tablet (70 mg total) by mouth once a week. Take with a full glass of water on an empty stomach. (Patient not taking: Reported on 09/14/2017) 12 tablet 1   No current facility-administered medications for this visit.     Review of Systems : See HPI for pertinent positives and negatives.  Physical Examination  Vitals:   09/14/17 1153 09/14/17 1155  BP: (!) 160/71 (!) 143/62  Pulse: (!) 54   Resp: 16   Temp: 98 F (36.7 C)   TempSrc: Oral   SpO2: 100%   Weight: 109 lb 4.8 oz (49.6 kg)   Height: 5\' 2"  (1.575 m)    Body mass index is 19.99 kg/m.  General: WDWN female in NAD GAIT:right leg shorter, walks with walker, slow, steady HENT: No gross abnormalities Eyes: PERRLA Pulmonary: Respirations are non-labored, CTAB, no rales, rhonchi, or wheezing. Cardiac:  regular rhythm, no detected murmur.  VASCULAR EXAM Carotid Bruits Left Right   positive positive    Radial pulses are 2+ palpable and equal.      LE Pulses LEFT RIGHT   POPLITEAL not palpable  not palpable   POSTERIOR TIBIAL not palpable  not palpable    DORSALIS PEDIS  ANTERIOR TIBIAL palpable  palpable     Gastrointestinal: soft, nontender, BS WNL, no r/g,no palpable masses, abdominal aortic pulse is not palpable.  Musculoskeletal: No muscle atrophy/wasting. M/S 4/5 in UE's, 4/5 in LE's, Extremities without ischemic changes.  Bilateral pretibial pitting edema: 1+ right, 2+ left.  Neurologic: A&O X 3; appropriate affect, normal sensation, speech is normal, CN 2-12 intact except some hearing loss, Pain and light touch intact in extremities, Motor exam as listed above. Skin: No rash, no cellulitis, no ulcers noted.  Psychiatric: Normal thought content, mood appropriate to clinical situation.     Assessment: Tammie [name] is a 84 y.o. female who has no history of stroke or TIA. Dependent bilateral pitting edema: knee hight compression hose and elevation of feet above heart when not walking; see Patient Instructions.     DATA Carotid Duplex (09/14/17): <40% right ICA stenosis and 40 - 59 % left ICA stenosis. Left ECA with >50% diameter reduction. Bilateral vertebral artery flow is antegrade.  Bilateral subclavian artery waveforms are normal.  No significant change compared to exams of 02-26-15, 09-03-15, and 09-08-16.   Plan:  Follow-up in 1year with Carotid Duplex scan.   I discussed in depth with the patient the nature of atherosclerosis, and emphasized the importance of maximal medical management including strict control of blood pressure, blood glucose, and lipid levels, obtaining regular  exercise, and continued cessation of smoking.  The patient is aware that without maximal medical management the underlying atherosclerotic disease process will progress, limiting the benefit of any interventions. The patient was given information about stroke prevention and what symptoms should prompt the patient to seek immediate medical care. Thank you for allowing [place] to participate in this patient's care.  Clemon Chambers, RN, MSN, FNP-C [facility] Office: [phone]  Clinic Physician: Early  09/14/17 12:02 PM

## 2017-09-14 NOTE — Patient Instructions (Addendum)
To measure for knee high compression hose: Measure the length of calf (from the crease of the knee to the bottom of the heel), largest circumference of calf, and ankle circumference first thing in the morning before your legs have a chance to swell.  Take these 3 measurements with you to obtain 20-30 or 15-20 mm mercury graduated knee high compression hose.  Put the stockings on in the morning, remove at bedtime.    Edema Edema is when you have too much fluid in your body or under your skin. Edema may make your legs, feet, and ankles swell up. Swelling is also common in looser tissues, like around your eyes. This is a common condition. It gets more common as you get older. There are many possible causes of edema. Eating too much salt (sodium) and being on your feet or sitting for a long time can cause edema in your legs, feet, and ankles. Hot weather may make edema worse. Edema is usually painless. Your skin may look swollen or shiny. Follow these instructions at home:  Keep the swollen body part raised (elevated) above the level of your heart when you are sitting or lying down.  Do not sit still or stand for a long time.  Do not wear tight clothes. Do not wear garters on your upper legs.  Exercise your legs. This can help the swelling go down.  Wear elastic bandages or support stockings as told by your doctor.  Eat a low-salt (low-sodium) diet to reduce fluid as told by your doctor.  Depending on the cause of your swelling, you may need to limit how much fluid you drink (fluid restriction).  Take over-the-counter and prescription medicines only as told by your doctor. Contact a doctor if:  Treatment is not working.  You have heart, liver, or kidney disease and have symptoms of edema.  You have sudden and unexplained weight gain. Get help right away if:  You have shortness of breath or chest pain.  You cannot breathe when you lie down.  You have pain, redness, or warmth in  the swollen areas.  You have heart, liver, or kidney disease and get edema all of a sudden.  You have a fever and your symptoms get worse all of a sudden. Summary  Edema is when you have too much fluid in your body or under your skin.  Edema may make your legs, feet, and ankles swell up. Swelling is also common in looser tissues, like around your eyes.  Raise (elevate) the swollen body part above the level of your heart when you are sitting or lying down.  Follow your doctor's instructions about diet and how much fluid you can drink (fluid restriction). This information is not intended to replace advice given to you by your health care provider. Make sure you discuss any questions you have with your health care provider. Document Released: 01/04/2008 Document Revised: 08/05/2016 Document Reviewed: 08/05/2016 Elsevier Interactive Patient Education  2017 [employer].    Stroke Prevention Some health problems and behaviors may make it more likely for you to have a stroke. Below are ways to lessen your risk of having a stroke.  Be active for at least 30 minutes on most or all days.  Do not smoke. Try not to be around others who smoke.  Do not drink too much alcohol. ? Do not have more than 2 drinks a day if you are a man. ? Do not have more than 1 drink a day if  you are a woman and are not pregnant.  Eat healthy foods, such as fruits and vegetables. If you were put on a specific diet, follow the diet as told.  Keep your cholesterol levels under control through diet and medicines. Look for foods that are low in saturated fat, trans fat, cholesterol, and are high in fiber.  If you have diabetes, follow all diet plans and take your medicine as told.  Ask your doctor if you need treatment to lower your blood pressure. If you have high blood pressure (hypertension), follow all diet plans and take your medicine as told by your doctor.  If you are 18-14 years old, have your blood  pressure checked every 3-5 years. If you are age 62 or older, have your blood pressure checked every year.  Keep a healthy weight. Eat foods that are low in calories, salt, saturated fat, trans fat, and cholesterol.  Do not take drugs.  Avoid birth control pills, if this applies. Talk to your doctor about the risks of taking birth control pills.  Talk to your doctor if you have sleep problems (sleep apnea).  Take all medicine as told by your doctor. ? You may be told to take aspirin or blood thinner medicine. Take this medicine as told by your doctor. ? Understand your medicine instructions.  Make sure any other conditions you have are being taken care of.  Get help right away if:  You suddenly lose feeling (you feel numb) or have weakness in your face, arm, or leg.  Your face or eyelid hangs down to one side.  You suddenly feel confused.  You have trouble talking (aphasia) or understanding what people are saying.  You suddenly have trouble seeing in one or both eyes.  You suddenly have trouble walking.  You are dizzy.  You lose your balance or your movements are clumsy (uncoordinated).  You suddenly have a very bad headache and you do not know the cause.  You have new chest pain.  Your heart feels like it is fluttering or skipping a beat (irregular heartbeat). Do not wait to see if the symptoms above go away. Get help right away. Call your local emergency services (911 in [place] Do not drive yourself to the hospital. This information is not intended to replace advice given to you by your health care provider. Make sure you discuss any questions you have with your health care provider. Document Released: 01/17/2012 Document Revised: 12/24/2015 Document Reviewed: 01/18/2013 Elsevier Interactive Patient Education  [employer].

## 2017-09-18 DIAGNOSIS — R4189 Other symptoms and signs involving cognitive functions and awareness: Secondary | ICD-10-CM | POA: Diagnosis not present

## 2017-09-18 DIAGNOSIS — I10 Essential (primary) hypertension: Secondary | ICD-10-CM | POA: Diagnosis not present

## 2017-09-18 DIAGNOSIS — I671 Cerebral aneurysm, nonruptured: Secondary | ICD-10-CM | POA: Diagnosis not present

## 2017-09-18 DIAGNOSIS — R7303 Prediabetes: Secondary | ICD-10-CM | POA: Diagnosis not present

## 2017-10-11 DIAGNOSIS — B028 Zoster with other complications: Secondary | ICD-10-CM | POA: Diagnosis not present

## 2017-10-11 DIAGNOSIS — R51 Headache: Secondary | ICD-10-CM | POA: Diagnosis not present

## 2017-10-12 DIAGNOSIS — B028 Zoster with other complications: Secondary | ICD-10-CM | POA: Diagnosis not present

## 2017-10-12 DIAGNOSIS — Z961 Presence of intraocular lens: Secondary | ICD-10-CM | POA: Diagnosis not present

## 2017-10-12 DIAGNOSIS — B0230 Zoster ocular disease, unspecified: Secondary | ICD-10-CM | POA: Diagnosis not present

## 2017-10-24 DIAGNOSIS — B0230 Zoster ocular disease, unspecified: Secondary | ICD-10-CM | POA: Diagnosis not present

## 2017-11-14 DIAGNOSIS — R7303 Prediabetes: Secondary | ICD-10-CM | POA: Diagnosis not present

## 2017-11-14 DIAGNOSIS — E785 Hyperlipidemia, unspecified: Secondary | ICD-10-CM | POA: Diagnosis not present

## 2017-11-14 DIAGNOSIS — I671 Cerebral aneurysm, nonruptured: Secondary | ICD-10-CM | POA: Diagnosis not present

## 2017-11-14 DIAGNOSIS — I10 Essential (primary) hypertension: Secondary | ICD-10-CM | POA: Diagnosis not present

## 2017-12-18 ENCOUNTER — Other Ambulatory Visit: Payer: Self-pay | Admitting: Internal Medicine

## 2017-12-18 DIAGNOSIS — Z1231 Encounter for screening mammogram for malignant neoplasm of breast: Secondary | ICD-10-CM

## 2018-01-16 ENCOUNTER — Ambulatory Visit: Payer: Medicare Other

## 2018-01-16 ENCOUNTER — Ambulatory Visit
Admission: RE | Admit: 2018-01-16 | Discharge: 2018-01-16 | Disposition: A | Discharge status: Home / Self Care | Payer: Medicare Other | Source: Ambulatory Visit | Attending: Internal Medicine | Admitting: Internal Medicine

## 2018-01-16 DIAGNOSIS — Z1231 Encounter for screening mammogram for malignant neoplasm of breast: Secondary | ICD-10-CM

## 2018-03-19 DIAGNOSIS — R4189 Other symptoms and signs involving cognitive functions and awareness: Secondary | ICD-10-CM | POA: Diagnosis not present

## 2018-03-19 DIAGNOSIS — N183 Chronic kidney disease, stage 3 (moderate): Secondary | ICD-10-CM | POA: Diagnosis not present

## 2018-03-19 DIAGNOSIS — E039 Hypothyroidism, unspecified: Secondary | ICD-10-CM | POA: Diagnosis not present

## 2018-04-18 DIAGNOSIS — W5501XA Bitten by cat, initial encounter: Secondary | ICD-10-CM | POA: Diagnosis not present

## 2018-04-18 DIAGNOSIS — L089 Local infection of the skin and subcutaneous tissue, unspecified: Secondary | ICD-10-CM | POA: Diagnosis not present

## 2018-04-18 DIAGNOSIS — S91051A Open bite, right ankle, initial encounter: Secondary | ICD-10-CM | POA: Diagnosis not present

## 2018-04-20 ENCOUNTER — Other Ambulatory Visit: Payer: Self-pay

## 2018-04-20 ENCOUNTER — Inpatient Hospital Stay (HOSPITAL_COMMUNITY)
Admission: EM | Admit: 2018-04-20 | Discharge: 2018-04-23 | DRG: 605 | Disposition: A | Discharge status: Home Health | Payer: Medicare Other | Attending: Internal Medicine | Admitting: Internal Medicine

## 2018-04-20 ENCOUNTER — Encounter (HOSPITAL_COMMUNITY): Payer: Self-pay

## 2018-04-20 DIAGNOSIS — F0390 Unspecified dementia without behavioral disturbance: Secondary | ICD-10-CM | POA: Diagnosis present

## 2018-04-20 DIAGNOSIS — Z87891 Personal history of nicotine dependence: Secondary | ICD-10-CM | POA: Diagnosis not present

## 2018-04-20 DIAGNOSIS — D472 Monoclonal gammopathy: Secondary | ICD-10-CM | POA: Diagnosis not present

## 2018-04-20 DIAGNOSIS — I252 Old myocardial infarction: Secondary | ICD-10-CM

## 2018-04-20 DIAGNOSIS — L03115 Cellulitis of right lower limb: Secondary | ICD-10-CM | POA: Diagnosis not present

## 2018-04-20 DIAGNOSIS — S81851A Open bite, right lower leg, initial encounter: Principal | ICD-10-CM | POA: Diagnosis present

## 2018-04-20 DIAGNOSIS — Z803 Family history of malignant neoplasm of breast: Secondary | ICD-10-CM | POA: Diagnosis not present

## 2018-04-20 DIAGNOSIS — Z853 Personal history of malignant neoplasm of breast: Secondary | ICD-10-CM

## 2018-04-20 DIAGNOSIS — I6523 Occlusion and stenosis of bilateral carotid arteries: Secondary | ICD-10-CM | POA: Diagnosis not present

## 2018-04-20 DIAGNOSIS — Z7902 Long term (current) use of antithrombotics/antiplatelets: Secondary | ICD-10-CM

## 2018-04-20 DIAGNOSIS — I2510 Atherosclerotic heart disease of native coronary artery without angina pectoris: Secondary | ICD-10-CM | POA: Diagnosis present

## 2018-04-20 DIAGNOSIS — I10 Essential (primary) hypertension: Secondary | ICD-10-CM | POA: Diagnosis present

## 2018-04-20 DIAGNOSIS — K219 Gastro-esophageal reflux disease without esophagitis: Secondary | ICD-10-CM | POA: Diagnosis present

## 2018-04-20 DIAGNOSIS — Z638 Other specified problems related to primary support group: Secondary | ICD-10-CM | POA: Diagnosis not present

## 2018-04-20 DIAGNOSIS — E785 Hyperlipidemia, unspecified: Secondary | ICD-10-CM | POA: Diagnosis present

## 2018-04-20 DIAGNOSIS — M79604 Pain in right leg: Secondary | ICD-10-CM | POA: Diagnosis not present

## 2018-04-20 DIAGNOSIS — Z23 Encounter for immunization: Secondary | ICD-10-CM | POA: Diagnosis not present

## 2018-04-20 DIAGNOSIS — W5501XA Bitten by cat, initial encounter: Secondary | ICD-10-CM

## 2018-04-20 DIAGNOSIS — E039 Hypothyroidism, unspecified: Secondary | ICD-10-CM | POA: Diagnosis not present

## 2018-04-20 DIAGNOSIS — Z8349 Family history of other endocrine, nutritional and metabolic diseases: Secondary | ICD-10-CM

## 2018-04-20 DIAGNOSIS — R600 Localized edema: Secondary | ICD-10-CM | POA: Diagnosis not present

## 2018-04-20 DIAGNOSIS — Z8249 Family history of ischemic heart disease and other diseases of the circulatory system: Secondary | ICD-10-CM | POA: Diagnosis not present

## 2018-04-20 LAB — I-STAT CHEM 8, ED
BUN: 17 mg/dL (ref 8–23)
CALCIUM ION: 1.2 mmol/L (ref 1.15–1.40)
CHLORIDE: 106 mmol/L (ref 98–111)
Creatinine, Ser: 1.1 mg/dL — ABNORMAL HIGH (ref 0.44–1.00)
Glucose, Bld: 99 mg/dL (ref 70–99)
HCT: 38 % (ref 36.0–46.0)
Hemoglobin: 12.9 g/dL (ref 12.0–15.0)
Potassium: 4.1 mmol/L (ref 3.5–5.1)
SODIUM: 141 mmol/L (ref 135–145)
TCO2: 25 mmol/L (ref 22–32)

## 2018-04-20 LAB — CBC WITH DIFFERENTIAL/PLATELET
Basophils Absolute: 0 10*3/uL (ref 0.0–0.1)
Basophils Relative: 0 %
EOS ABS: 0 10*3/uL (ref 0.0–0.7)
EOS PCT: 0 %
HCT: 39.6 % (ref 36.0–46.0)
Hemoglobin: 12.5 g/dL (ref 12.0–15.0)
LYMPHS ABS: 1.4 10*3/uL (ref 0.7–4.0)
Lymphocytes Relative: 13 %
MCH: 30.6 pg (ref 26.0–34.0)
MCHC: 31.6 g/dL (ref 30.0–36.0)
MCV: 96.8 fL (ref 78.0–100.0)
Monocytes Absolute: 1.2 10*3/uL — ABNORMAL HIGH (ref 0.1–1.0)
Monocytes Relative: 11 %
Neutro Abs: 8.3 10*3/uL — ABNORMAL HIGH (ref 1.7–7.7)
Neutrophils Relative %: 76 %
PLATELETS: 236 10*3/uL (ref 150–400)
RBC: 4.09 MIL/uL (ref 3.87–5.11)
RDW: 13.3 % (ref 11.5–15.5)
WBC: 10.9 10*3/uL — AB (ref 4.0–10.5)

## 2018-04-20 MED ORDER — AMPICILLIN-SULBACTAM SODIUM 3 (2-1) G IJ SOLR
3.0000 g | Freq: Once | INTRAMUSCULAR | Status: AC
  Administered 2018-04-20: 3 g via INTRAVENOUS
  Filled 2018-04-20: qty 3

## 2018-04-20 MED ORDER — CLOPIDOGREL BISULFATE 75 MG PO TABS
75.0000 mg | ORAL_TABLET | Freq: Every day | ORAL | Status: DC
  Administered 2018-04-20 – 2018-04-22 (×3): 75 mg via ORAL
  Filled 2018-04-20 (×3): qty 1

## 2018-04-20 MED ORDER — AMLODIPINE BESYLATE 10 MG PO TABS
10.0000 mg | ORAL_TABLET | Freq: Every day | ORAL | Status: DC
  Administered 2018-04-20 – 2018-04-23 (×3): 10 mg via ORAL
  Filled 2018-04-20 (×4): qty 1

## 2018-04-20 MED ORDER — PIPERACILLIN-TAZOBACTAM 3.375 G IVPB
3.3750 g | Freq: Three times a day (TID) | INTRAVENOUS | Status: DC
  Administered 2018-04-20 – 2018-04-23 (×8): 3.375 g via INTRAVENOUS
  Filled 2018-04-20 (×8): qty 50

## 2018-04-20 MED ORDER — TETANUS-DIPHTH-ACELL PERTUSSIS 5-2.5-18.5 LF-MCG/0.5 IM SUSP
0.5000 mL | Freq: Once | INTRAMUSCULAR | Status: AC
  Administered 2018-04-20: 0.5 mL via INTRAMUSCULAR
  Filled 2018-04-20: qty 0.5

## 2018-04-20 MED ORDER — DONEPEZIL HCL 10 MG PO TABS
5.0000 mg | ORAL_TABLET | Freq: Every day | ORAL | Status: DC
  Administered 2018-04-20 – 2018-04-23 (×4): 5 mg via ORAL
  Filled 2018-04-20 (×4): qty 1

## 2018-04-20 MED ORDER — ENOXAPARIN SODIUM 40 MG/0.4ML ~~LOC~~ SOLN
40.0000 mg | SUBCUTANEOUS | Status: DC
  Administered 2018-04-20 – 2018-04-22 (×3): 40 mg via SUBCUTANEOUS
  Filled 2018-04-20 (×3): qty 0.4

## 2018-04-20 MED ORDER — LEVOTHYROXINE SODIUM 88 MCG PO TABS
88.0000 ug | ORAL_TABLET | Freq: Every day | ORAL | Status: DC
  Administered 2018-04-21 – 2018-04-23 (×3): 88 ug via ORAL
  Filled 2018-04-20 (×4): qty 1

## 2018-04-20 NOTE — ED Provider Notes (Signed)
[facility] Provider Note   CSN: [id number] Arrival date & time: 04/20/18  1040     History   Chief Complaint Chief Complaint  Patient presents with  . Animal Bite    HPI Tammie [name] is a 84 y.o. female.  The history is provided by the patient.  Animal Bite  Contact animal:  Cat Location:  Leg Leg injury location:  R lower leg Time since incident:  6 days Pain details:    Quality:  Sharp, localized, sore and stinging   Severity:  Moderate   Timing:  Constant   Progression:  Worsening Incident location:  Home Provoked: unprovoked   Notifications:  Animal control Animal's rabies vaccination status:  Up to date Animal in possession: yes   Tetanus status:  Unknown Relieved by:  Nothing Worsened by:  Nothing Ineffective treatments: tried abx but not helping.  sx worse over the last 2 days. Associated symptoms: swelling   Associated symptoms: no fever     Past Medical History:  Diagnosis Date  . Bilateral carotid artery stenosis    hx  . Breast cancer T1aN0 S/P lumpectomy SLN Bx 01/2011 03/25/2011  . Breast lump    left, benign  . Cancer ([place])    breast  . Depression   . Fall from grocery cart Jan. 31, 2015   Fx  Right  Hip  . GERD (gastroesophageal reflux disease)   . History of ETOH abuse    member AA 22.5 years  . Hyperlipidemia   . Hypertension   . Lumbar herniated disc   . Myocardial infarction ([place]) 1999   mi  . Thyroid disease    hypo    Patient Active Problem List   Diagnosis Date Noted  . Cat bite 04/20/2018  . MGUS (monoclonal gammopathy of unknown significance) 04/10/2015  . Hypothyroidism 09/06/2013  . Depression 09/06/2013  . Osteoporosis 09/06/2013  . Hip fracture, intertrochanteric ([place]) 08/31/2013  . Hypertension 08/31/2013  . Hyperlipidemia 08/31/2013  . GERD (gastroesophageal reflux disease) 08/31/2013  . Bradycardia 08/31/2013  . Sinus node dysfunction ([place]) 08/31/2013  .  Pre-operative cardiovascular examination 08/31/2013  . Occlusion and stenosis of carotid artery without mention of cerebral infarction 02/23/2012  . Breast cancer of lower-outer quadrant of left female breast ([place]) 03/25/2011    Past Surgical History:  Procedure Laterality Date  . BREAST BIOPSY  200, 88  . BREAST BIOPSY  1985/2000   2 BENIGN LEFT BREAST BXS  . BREAST LUMPECTOMY Left 2012   bening  . FEMUR IM NAIL Right 09/01/2013   Procedure: INTRAMEDULLARY (IM) NAIL FEMORAL;  Surgeon: [name], MD;  Location: [facility];  Service: Orthopedics;  Laterality: Right;  . FRACTURE SURGERY Right Jan. 31, 2015   Hip     OB History    Gravida  0   Para      Term      Preterm      AB      Living        SAB      TAB      Ectopic      Multiple      Live Births           Obstetric Comments  NO CHILDREN         Home Medications    Prior to Admission medications   Medication Sig Start Date End Date Taking? Authorizing Provider  amLODipine (NORVASC) 10 MG tablet Take 10 mg  by mouth daily.     Yes [provider]  clopidogrel (PLAVIX) 75 MG tablet Take 75 mg by mouth at bedtime.    Yes [provider]  donepezil (ARICEPT) 5 MG tablet Take 5 mg by mouth daily. 04/05/18  Yes [provider]  levofloxacin (LEVAQUIN) 500 MG tablet Take 500 mg by mouth 2 (two) times daily.  04/18/18  Yes [provider]  levothyroxine (SYNTHROID, LEVOTHROID) 88 MCG tablet Take 88 mcg by mouth daily.   Yes [provider]  alendronate (FOSAMAX) 70 MG tablet Take 1 tablet (70 mg total) by mouth once a week. Take with a full glass of water on an empty stomach. Patient not taking: Reported on 04/20/2018 10/09/13   Marcy Panning, MD    Family History Family History  Problem Relation Age of Onset  . Heart attack Mother   . Hypertension Mother   . Hyperlipidemia Mother   . Heart attack Father   . Hypertension Father   . Hyperlipidemia Father   .  Breast cancer Paternal Aunt     Social History Social History   Tobacco Use  . Smoking status: Former Smoker    Types: Cigarettes    Last attempt to quit: 09/29/2004    Years since quitting: 13.5  . Smokeless tobacco: Never Used  . Tobacco comment: 2007- quit  Substance Use Topics  . Alcohol use: No    Alcohol/week: 0.0 standard drinks    Comment: hx of EtOHism - in AA x 27 years  . Drug use: No     Allergies   Patient has no known allergies.   Review of Systems Review of Systems  Constitutional: Negative for fever.  All other systems reviewed and are negative.    Physical Exam Updated Vital Signs BP (!) 142/49 (BP Location: Left Arm)   Pulse (!) 56   Temp 98.5 F (36.9 C) (Oral)   Resp 18   Ht 5\' 3"  (1.6 m)   Wt 54.4 kg   SpO2 97%   BMI 21.26 kg/m   Physical Exam  Constitutional: She is oriented to person, place, and time. She appears well-developed and well-nourished. No distress.  HENT:  Head: Normocephalic and atraumatic.  Eyes: Pupils are equal, round, and reactive to light. EOM are normal.  Cardiovascular: Normal rate, regular rhythm, normal heart sounds and intact distal pulses. Exam reveals no friction rub.  No murmur heard. Pulmonary/Chest: Effort normal and breath sounds normal. She has no wheezes. She has no rales.  Abdominal: Soft. Bowel sounds are normal. She exhibits no distension. There is no tenderness. There is no rebound and no guarding.  Musculoskeletal: Normal range of motion. She exhibits edema and tenderness.       Legs: No edema  Neurological: She is alert and oriented to person, place, and time. No cranial nerve deficit.  Skin: Skin is warm and dry. No rash noted.  Psychiatric: She has a normal mood and affect. Her behavior is normal.  Nursing note and vitals reviewed.    ED Treatments / Results  Labs (all labs ordered are listed, but only abnormal results are displayed) Labs Reviewed  CBC WITH DIFFERENTIAL/PLATELET - Abnormal;  Notable for the following components:      Result Value   WBC 10.9 (*)    Neutro Abs 8.3 (*)    Monocytes Absolute 1.2 (*)    All other components within normal limits  I-STAT CHEM 8, ED - Abnormal; Notable for the following components:  Creatinine, Ser 1.10 (*)    All other components within normal limits  COMPREHENSIVE METABOLIC PANEL  CBC    EKG None  Radiology No results found.  Procedures Procedures (including critical care time)  Medications Ordered in ED Medications  amLODipine (NORVASC) tablet 10 mg (10 mg Oral Given 04/20/18 1746)  donepezil (ARICEPT) tablet 5 mg (5 mg Oral Given 04/20/18 1746)  levothyroxine (SYNTHROID, LEVOTHROID) tablet 88 mcg (has no administration in time range)  clopidogrel (PLAVIX) tablet 75 mg (has no administration in time range)  enoxaparin (LOVENOX) injection 40 mg (has no administration in time range)  piperacillin-tazobactam (ZOSYN) IVPB 3.375 g (3.375 g Intravenous New Bag/Given 04/20/18 1742)  Ampicillin-Sulbactam (UNASYN) 3 g in sodium chloride 0.9 % 100 mL IVPB (0 g Intravenous Stopped 04/20/18 1342)  Tdap (BOOSTRIX) injection 0.5 mL (0.5 mLs Intramuscular Given 04/20/18 1241)     Initial Impression / Assessment and Plan / ED Course  I have reviewed the triage vital signs and the nursing notes.  Pertinent labs & imaging results that were available during my care of the patient were reviewed by me and considered in my medical decision making (see chart for details).     Pt present with cat bite 6 days ago now with cellulitis which is worsening despite oral medications.  No systemic sx but foul drainage and cellulitis of the lower leg.  Pt will be admitted for IV abx.  Final Clinical Impressions(s) / ED Diagnoses   Final diagnoses:  Cat bite, initial encounter  Cellulitis of right lower extremity    ED Discharge Orders    None       Blanchie Dessert, MD 04/20/18 2110

## 2018-04-20 NOTE — ED Notes (Signed)
[employer] completed by Probation officer and faxed by Lattie Haw, Therapist, occupational.

## 2018-04-20 NOTE — H&P (Signed)
History and Physical    Tammie [name] KDT:[id number] DOB: [birth year] DOA: 04/20/2018  I have briefly reviewed the patient's prior medical records in [place]  PCP: Tammie Mark Altamese Cabal, MD  Patient coming from: ILF  Chief Complaint: right leg cellulitis   HPI: Tammie [name] is a 84 y.o. female with medical history significant of hypothyroidism, mild dementia, hypertension, hyperlipidemia who presents to the hospital with complaints of right lower extremity cellulitis.  Patient was scratched quite significantly by her cat about 6 days ago and started developing an area of redness on her lateral shin.  She was seen by a provider at her independent living facility and was prescribed and antibiotics few days ago.  Patient is not sure what antibiotic was prescribed or for how many days has been taking but at least 2.  Despite oral antibiotics, her shin redness started to spread and started tracking upwards and the areas of the scratch have become black.  She also has noticed increased pain especially with ambulation.  She denies any systemic symptoms, has had no fever or chills, no abdominal pain, nausea or vomiting.  It appears that the nursing staff have been constantly monitoring the cellulitic area, and given the fact that it was getting bigger and bigger directed the patient to the hospital.  ED Course: In the emergency room patient is afebrile, normotensive, blood work reveals slight leukocytosis of 10.9 but otherwise unremarkable.  We are asked to admit for cellulitis refractory to oral antibiotics.  Review of Systems: As per HPI otherwise 10 point review of systems negative.   Past Medical History:  Diagnosis Date  . Bilateral carotid artery stenosis    hx  . Breast cancer T1aN0 S/P lumpectomy SLN Bx 01/2011 03/25/2011  . Breast lump    left, benign  . Cancer ([place])    breast  . Depression   . Fall from grocery cart Jan. 31, 2015   Fx  Right  Hip  . GERD (gastroesophageal  reflux disease)   . History of ETOH abuse    member AA 22.5 years  . Hyperlipidemia   . Hypertension   . Lumbar herniated disc   . Myocardial infarction ([place]) 1999   mi  . Thyroid disease    hypo    Past Surgical History:  Procedure Laterality Date  . BREAST BIOPSY  200, 88  . BREAST BIOPSY  1985/2000   2 BENIGN LEFT BREAST BXS  . BREAST LUMPECTOMY Left 2012   bening  . FEMUR IM NAIL Right 09/01/2013   Procedure: INTRAMEDULLARY (IM) NAIL FEMORAL;  Surgeon: [name], MD;  Location: [facility];  Service: Orthopedics;  Laterality: Right;  . FRACTURE SURGERY Right Jan. 31, 2015   Hip     reports that she quit smoking about 13 years ago. Her smoking use included cigarettes. She has never used smokeless tobacco. She reports that she does not drink alcohol or use drugs.  No Known Allergies  Family History  Problem Relation Age of Onset  . Heart attack Mother   . Hypertension Mother   . Hyperlipidemia Mother   . Heart attack Father   . Hypertension Father   . Hyperlipidemia Father   . Breast cancer Paternal Aunt     Prior to Admission medications   Medication Sig Start Date End Date Taking? Authorizing Provider  amLODipine (NORVASC) 10 MG tablet Take 10 mg by mouth daily.     Yes [provider]  clopidogrel (PLAVIX) 75 MG  tablet Take 75 mg by mouth at bedtime.    Yes [provider]  donepezil (ARICEPT) 5 MG tablet Take 5 mg by mouth daily. 04/05/18  Yes [provider]  levofloxacin (LEVAQUIN) 500 MG tablet Take 500 mg by mouth 2 (two) times daily.  04/18/18  Yes [provider]  levothyroxine (SYNTHROID, LEVOTHROID) 88 MCG tablet Take 88 mcg by mouth daily.   Yes [provider]  alendronate (FOSAMAX) 70 MG tablet Take 1 tablet (70 mg total) by mouth once a week. Take with a full glass of water on an empty stomach. Patient not taking: Reported on 04/20/2018 10/09/13   Marcy Panning, MD    Physical Exam: Vitals:   04/20/18 1050  04/20/18 1300 04/20/18 1400  BP: (!) 143/63 (!) 164/48 (!) 148/57  Pulse: 68 (!) 57 64  Resp: 16 16 16   Temp: 98.8 F (37.1 C)    TempSrc: Oral    SpO2: 96% 97% 99%  Weight: 54.4 kg    Height: 5\' 3"  (1.6 m)        Constitutional: NAD, calm, comfortable Eyes: PERRL, lids and conjunctivae normal ENMT: Mucous membranes are moist.  Neck: normal, supple, no masses, no thyromegaly Respiratory: clear to auscultation bilaterally, no wheezing, no crackles. Normal respiratory effort. No accessory muscle use.  Cardiovascular: Regular rate and rhythm, no murmurs / rubs / gallops. No extremity edema. 2+ pedal pulses.  Abdomen: no tenderness, no masses palpated. Bowel sounds positive.  Musculoskeletal: no clubbing / cyanosis. Normal muscle tone.  Skin: 2 necrotic appearing areas on the lateral shin with surrounding cellulitis, margins drawn Neurologic: CN 2-12 grossly intact. Strength 5/5 in all 4.  Psychiatric: Normal judgment and insight. Alert and oriented x 3. Normal mood.   Labs on Admission: I have personally reviewed following labs and imaging studies  CBC: Recent Labs  Lab 04/20/18 1245 04/20/18 1256  WBC 10.9*  --   NEUTROABS 8.3*  --   HGB 12.5 12.9  HCT 39.6 38.0  MCV 96.8  --   PLT 236  --    Basic Metabolic Panel: Recent Labs  Lab 04/20/18 1256  NA 141  K 4.1  CL 106  GLUCOSE 99  BUN 17  CREATININE 1.10*   GFR: Estimated Creatinine Clearance: 32.6 mL/min (A) (by C-G formula based on SCr of 1.1 mg/dL (H)). Liver Function Tests: No results for input(s): AST, ALT, ALKPHOS, BILITOT, PROT, ALBUMIN in the last 168 hours. No results for input(s): LIPASE, AMYLASE in the last 168 hours. No results for input(s): AMMONIA in the last 168 hours. Coagulation Profile: No results for input(s): INR, PROTIME in the last 168 hours. Cardiac Enzymes: No results for input(s): CKTOTAL, CKMB, CKMBINDEX, TROPONINI in the last 168 hours. BNP (last 3 results) No results for  input(s): PROBNP in the last 8760 hours. HbA1C: No results for input(s): HGBA1C in the last 72 hours. CBG: No results for input(s): GLUCAP in the last 168 hours. Lipid Profile: No results for input(s): CHOL, HDL, LDLCALC, TRIG, CHOLHDL, LDLDIRECT in the last 72 hours. Thyroid Function Tests: No results for input(s): TSH, T4TOTAL, FREET4, T3FREE, THYROIDAB in the last 72 hours. Anemia Panel: No results for input(s): VITAMINB12, FOLATE, FERRITIN, TIBC, IRON, RETICCTPCT in the last 72 hours. Urine analysis:    Component Value Date/Time   COLORURINE YELLOW 09/03/2013 2308   APPEARANCEUR CLOUDY (A) 09/03/2013 2308   LABSPEC 1.018 09/03/2013 2308   PHURINE 5.5 09/03/2013 2308   GLUCOSEU NEGATIVE 09/03/2013 2308   HGBUR  NEGATIVE 09/03/2013 2308   BILIRUBINUR NEGATIVE 09/03/2013 2308   KETONESUR NEGATIVE 09/03/2013 2308   PROTEINUR NEGATIVE 09/03/2013 2308   UROBILINOGEN 1.0 09/03/2013 2308   NITRITE NEGATIVE 09/03/2013 2308   LEUKOCYTESUR NEGATIVE 09/03/2013 2308     Radiological Exams on Admission: No results found.  Assessment/Plan Active Problems:   Cat bite   Right lower extremity wounds/cellulitis due to cat scratches -Patient started to develop necrotic areas on 2 sites, failed outpatient antibiotics, it appears that she was given Levaquin but she does not recall the name. -Start on Zosyn for cat scratch microbiology, once cellulitis is receding can probably be switched to Augmentin -Wound consult  Hypertension -Continue Norvasc  Coronary artery disease -No chest pain, continue her Plavix  Hypothyroidism -Continue Synthroid  Mild dementia -Barely noticeable, continue Aricept   DVT prophylaxis: Lovenox Code Status: Full code per patient Family Communication: No family present at bedside Disposition Plan: Back to ILF when ready Consults called: none     Admission status: observation   At the point of initial evaluation, it is my clinical opinion that  admission for OBSERVATION is reasonable and necessary because the patient's presenting complaints in the context of their chronic conditions represent sufficient risk of deterioration or significant morbidity to constitute reasonable grounds for close observation in the hospital setting, but that the patient may be medically stable for discharge from the hospital within 24 to 48 hours.   Marzetta Board, MD [facility] Pager [phone]  If 7PM-7AM, please contact night-coverage [email or web address] Password [email or web address]  04/20/2018, 3:12 PM

## 2018-04-20 NOTE — ED Notes (Signed)
Patient transport called to transport patient to 1525.

## 2018-04-20 NOTE — ED Notes (Signed)
ED TO INPATIENT HANDOFF REPORT  Name/Age/Gender Tammie [name] 84 y.o. female  Code Status Code Status History    Date Active Date Inactive Code Status Order ID Comments User Context   09/01/2013 2034 09/04/2013 1605 Full Code [id number]  [name], MD Inpatient   08/31/2013 1712 09/01/2013 2034 Full Code [id number]  Cristal Ford, DO ED      Home/SNF/Other Home  Chief Complaint cat bite   Level of Care/Admitting Diagnosis ED Disposition    ED Disposition Condition [facility] [[id number]]  Level of Care: Med-Surg [16]  Diagnosis: Cat bite [711657]  Admitting Physician: Caren Griffins [[id number]]  Attending Physician: Caren Griffins [[id number]]  PT Class (Do Not Modify): Observation [104]  PT Acc Code (Do Not Modify): Observation [10022]       Medical History Past Medical History:  Diagnosis Date  . Bilateral carotid artery stenosis    hx  . Breast cancer T1aN0 S/P lumpectomy SLN Bx 01/2011 03/25/2011  . Breast lump    left, benign  . Cancer ([place])    breast  . Depression   . Fall from grocery cart Jan. 31, 2015   Fx  Right  Hip  . GERD (gastroesophageal reflux disease)   . History of ETOH abuse    member AA 22.5 years  . Hyperlipidemia   . Hypertension   . Lumbar herniated disc   . Myocardial infarction ([place]) 1999   mi  . Thyroid disease    hypo    Allergies No Known Allergies  IV Location/Drains/Wounds Patient Lines/Drains/Airways Status   Active Line/Drains/Airways    Name:   Placement date:   Placement time:   Site:   Days:   Peripheral IV 04/20/18 Left Antecubital   04/20/18    1236    Antecubital   less than 1   Incision 09/01/13 Leg Right   09/01/13    1827     1692          Labs/Imaging Results for orders placed or performed during the hospital encounter of 04/20/18 (from the past 48 hour(s))  CBC with Differential/Platelet     Status: Abnormal   Collection Time: 04/20/18 12:45 PM  Result  Value Ref Range   WBC 10.9 (H) 4.0 - 10.5 K/uL   RBC 4.09 3.87 - 5.11 MIL/uL   Hemoglobin 12.5 12.0 - 15.0 g/dL   HCT 39.6 36.0 - 46.0 %   MCV 96.8 78.0 - 100.0 fL   MCH 30.6 26.0 - 34.0 pg   MCHC 31.6 30.0 - 36.0 g/dL   RDW 13.3 11.5 - 15.5 %   Platelets 236 150 - 400 K/uL   Neutrophils Relative % 76 %   Neutro Abs 8.3 (H) 1.7 - 7.7 K/uL   Lymphocytes Relative 13 %   Lymphs Abs 1.4 0.7 - 4.0 K/uL   Monocytes Relative 11 %   Monocytes Absolute 1.2 (H) 0.1 - 1.0 K/uL   Eosinophils Relative 0 %   Eosinophils Absolute 0.0 0.0 - 0.7 K/uL   Basophils Relative 0 %   Basophils Absolute 0.0 0.0 - 0.1 K/uL    Comment: Performed at [facility], [place] [address] [place], [place] [address]  I-stat chem 8, ed     Status: Abnormal   Collection Time: 04/20/18 12:56 PM  Result Value Ref Range   Sodium 141 135 - 145 mmol/L   Potassium 4.1 3.5 - 5.1 mmol/L  Chloride 106 98 - 111 mmol/L   BUN 17 8 - 23 mg/dL   Creatinine, Ser 1.10 (H) 0.44 - 1.00 mg/dL   Glucose, Bld 99 70 - 99 mg/dL   Calcium, Ion 1.20 1.15 - 1.40 mmol/L   TCO2 25 22 - 32 mmol/L   Hemoglobin 12.9 12.0 - 15.0 g/dL   HCT 38.0 36.0 - 46.0 %   No results found.  Pending Labs [employer] (From admission, onward)    Start     Ordered   Signed and Held  Comprehensive metabolic panel  Tomorrow morning,   R     Signed and Held   Signed and Held  CBC  Tomorrow morning,   R     Signed and Held          Vitals/Pain Today's Vitals   04/20/18 1430 04/20/18 1445 04/20/18 1530 04/20/18 1600  BP: (!) 127/91  (!) 169/66 (!) 160/63  Pulse:  61 65 67  Resp:      Temp:      TempSrc:      SpO2:  97% 98% 93%  Weight:      Height:      PainSc:        Isolation Precautions No active isolations  Medications Medications  piperacillin-tazobactam (ZOSYN) IVPB 3.375 g (has no administration in time range)  Ampicillin-Sulbactam (UNASYN) 3 g in sodium chloride 0.9 % 100 mL IVPB (0 g Intravenous Stopped  04/20/18 1342)  Tdap (BOOSTRIX) injection 0.5 mL (0.5 mLs Intramuscular Given 04/20/18 1241)    Mobility walks with device

## 2018-04-20 NOTE — ED Notes (Signed)
ED Provider at bedside. 

## 2018-04-20 NOTE — Progress Notes (Signed)
Pharmacy Antibiotic Note  Tammie [name] is a 84 y.o. female admitted on 04/20/2018 with cat bite.  Pharmacy has been consulted for Zosyn dosing. Patient received a dose of Unasyn in the ED.   Plan: Zosyn 3.375g IV q8h (each dose infused over 4 hours).  Monitor renal function, cultures, clinical course.    Height: 5\' 3"  (160 cm) Weight: 120 lb (54.4 kg) IBW/kg (Calculated) : 52.4  Temp (24hrs), Avg:98.8 F (37.1 C), Min:98.8 F (37.1 C), Max:98.8 F (37.1 C)  Recent Labs  Lab 04/20/18 1245 04/20/18 1256  WBC 10.9*  --   CREATININE  --  1.10*    Estimated Creatinine Clearance: 32.6 mL/min (A) (by C-G formula based on SCr of 1.1 mg/dL (H)).    No Known Allergies  Antimicrobials this admission: 9/20 Unasyn x 1 9/20 Zosyn >>   Dose adjustments this admission: --  Microbiology results: none  Thank you for allowing pharmacy to be a part of this patient's care.    Lindell Spar, PharmD, BCPS Pager: [phone] 04/20/2018 3:20 PM

## 2018-04-21 DIAGNOSIS — Z8349 Family history of other endocrine, nutritional and metabolic diseases: Secondary | ICD-10-CM | POA: Diagnosis not present

## 2018-04-21 DIAGNOSIS — Z853 Personal history of malignant neoplasm of breast: Secondary | ICD-10-CM | POA: Diagnosis not present

## 2018-04-21 DIAGNOSIS — W5501XA Bitten by cat, initial encounter: Secondary | ICD-10-CM

## 2018-04-21 DIAGNOSIS — L03115 Cellulitis of right lower limb: Secondary | ICD-10-CM

## 2018-04-21 DIAGNOSIS — D472 Monoclonal gammopathy: Secondary | ICD-10-CM | POA: Diagnosis present

## 2018-04-21 DIAGNOSIS — I10 Essential (primary) hypertension: Secondary | ICD-10-CM | POA: Diagnosis present

## 2018-04-21 DIAGNOSIS — Z7902 Long term (current) use of antithrombotics/antiplatelets: Secondary | ICD-10-CM | POA: Diagnosis not present

## 2018-04-21 DIAGNOSIS — Z638 Other specified problems related to primary support group: Secondary | ICD-10-CM | POA: Diagnosis not present

## 2018-04-21 DIAGNOSIS — E039 Hypothyroidism, unspecified: Secondary | ICD-10-CM | POA: Diagnosis present

## 2018-04-21 DIAGNOSIS — I6523 Occlusion and stenosis of bilateral carotid arteries: Secondary | ICD-10-CM | POA: Diagnosis present

## 2018-04-21 DIAGNOSIS — R600 Localized edema: Secondary | ICD-10-CM | POA: Diagnosis not present

## 2018-04-21 DIAGNOSIS — F0390 Unspecified dementia without behavioral disturbance: Secondary | ICD-10-CM | POA: Diagnosis present

## 2018-04-21 DIAGNOSIS — I2510 Atherosclerotic heart disease of native coronary artery without angina pectoris: Secondary | ICD-10-CM | POA: Diagnosis present

## 2018-04-21 DIAGNOSIS — Z87891 Personal history of nicotine dependence: Secondary | ICD-10-CM | POA: Diagnosis not present

## 2018-04-21 DIAGNOSIS — E785 Hyperlipidemia, unspecified: Secondary | ICD-10-CM | POA: Diagnosis present

## 2018-04-21 DIAGNOSIS — Z8249 Family history of ischemic heart disease and other diseases of the circulatory system: Secondary | ICD-10-CM | POA: Diagnosis not present

## 2018-04-21 DIAGNOSIS — K219 Gastro-esophageal reflux disease without esophagitis: Secondary | ICD-10-CM | POA: Diagnosis present

## 2018-04-21 DIAGNOSIS — S81851A Open bite, right lower leg, initial encounter: Secondary | ICD-10-CM | POA: Diagnosis present

## 2018-04-21 DIAGNOSIS — Z803 Family history of malignant neoplasm of breast: Secondary | ICD-10-CM | POA: Diagnosis not present

## 2018-04-21 DIAGNOSIS — Z23 Encounter for immunization: Secondary | ICD-10-CM | POA: Diagnosis present

## 2018-04-21 DIAGNOSIS — I252 Old myocardial infarction: Secondary | ICD-10-CM | POA: Diagnosis not present

## 2018-04-21 LAB — COMPREHENSIVE METABOLIC PANEL
ALT: 13 U/L (ref 0–44)
AST: 19 U/L (ref 15–41)
Albumin: 3.4 g/dL — ABNORMAL LOW (ref 3.5–5.0)
Alkaline Phosphatase: 50 U/L (ref 38–126)
Anion gap: 9 (ref 5–15)
BUN: 19 mg/dL (ref 8–23)
CHLORIDE: 107 mmol/L (ref 98–111)
CO2: 25 mmol/L (ref 22–32)
Calcium: 9.1 mg/dL (ref 8.9–10.3)
Creatinine, Ser: 1.15 mg/dL — ABNORMAL HIGH (ref 0.44–1.00)
GFR calc non Af Amer: 43 mL/min — ABNORMAL LOW (ref >60)
GFR, EST AFRICAN AMERICAN: 50 mL/min — AB (ref >60)
GLUCOSE: 109 mg/dL — AB (ref 70–99)
POTASSIUM: 4 mmol/L (ref 3.5–5.1)
SODIUM: 141 mmol/L (ref 135–145)
Total Bilirubin: 0.5 mg/dL (ref 0.3–1.2)
Total Protein: 7 g/dL (ref 6.5–8.1)

## 2018-04-21 LAB — CBC
HEMATOCRIT: 39.3 % (ref 36.0–46.0)
Hemoglobin: 12.5 g/dL (ref 12.0–15.0)
MCH: 30.4 pg (ref 26.0–34.0)
MCHC: 31.8 g/dL (ref 30.0–36.0)
MCV: 95.6 fL (ref 78.0–100.0)
Platelets: 229 10*3/uL (ref 150–400)
RBC: 4.11 MIL/uL (ref 3.87–5.11)
RDW: 13.3 % (ref 11.5–15.5)
WBC: 9.7 10*3/uL (ref 4.0–10.5)

## 2018-04-21 MED ORDER — HYDROCODONE-ACETAMINOPHEN 5-325 MG PO TABS
1.0000 | ORAL_TABLET | Freq: Four times a day (QID) | ORAL | Status: DC | PRN
  Administered 2018-04-21: 1 via ORAL
  Filled 2018-04-21: qty 1

## 2018-04-21 NOTE — Progress Notes (Addendum)
PROGRESS NOTE  Tammie [name] HWE:[id number] DOB: 03-28-1934 DOA: 04/20/2018 PCP: Lanice Shirts, MD  HPI/Recap of past 24 hours: Tammie [name] is a 84 y.o. female with medical history significant of hypothyroidism, dementia, hypertension, hyperlipidemia who presents to the hospital from her independent living facility with complaints of right lower extremity cellulitis after a cat scratch 6 days ago.  She was seen by a provider at her independent living facility and was prescribed antibiotics few days ago.  Not sure what antibiotic was prescribed. Given the fact that it was getting bigger and bigger directed the patient to the hospital.  Admitted for acute cellulitis secondary to cat scratch.  04/21/2018: Patient seen and examined at bedside.  She is alert in the setting of dementia.  States her right lower extremity pain from her cellulitis is improved after she takes her pain medications.  Patient has no family members in [place] or in [place].  Her next of kin is her cousin [name] at [phone].  Spoke with her cousin on the phone.  After discharge patient would be going to her independent living facility where she lives alone and has someone who comes in a couple of hours a day to help with her medications.  Patient has meals delivered to her as she requires assistance with her activities of daily living.  Assessment/Plan: Active Problems:   Cat bite  Right lower extremity cat scratch wound/cellulitis Received Tdap on 04/20/2018 Continue IV Zosyn Continue to monitor fever curve Continue to monitor WBC Wound care specialist consulted, awaiting recommendations Obtain CBC in the morning Continue to monitor vital signs  Hypertension Blood pressures well controlled Continue Norvasc  Coronary artery disease Denies chest pain Continue Plavix  Hypothyroidism Continue Synthroid  Dementia complicated by poor social support Patient has no family members in the  area Has no children no husband only next of kin is her cousin who lives in a different state Lives alone and unable to take care of activities of daily living Unable to discharge today 04/21/2018 to her independent living facility due to above-high risk for developing wound complications due to inability to take care of herself.   Risks: High risk for decompensation due to cat scratch induced wound requiring IV antibiotics, multiple comorbidities, dementia with poor social support, and advanced age.   Code Status: Full code  Family Communication: Spoke with her next of kin on the phone.  Disposition Plan: To independent living facility possibly tomorrow 04/22/2018   Consultants:  Wound care specialist  Procedures:  None  Antimicrobials:  IV Zosyn  DVT prophylaxis: Subcu Lovenox   Objective: Vitals:   04/20/18 1626 04/20/18 2049 04/21/18 0614 04/21/18 1000  BP: (!) 178/66 (!) 142/49 (!) 141/55 (!) 132/46  Pulse: 64 (!) 56 72   Resp:  18 20   Temp: 97.6 F (36.4 C) 98.5 F (36.9 C) 98.3 F (36.8 C)   TempSrc: Oral Oral Oral   SpO2: 99% 97% 99%   Weight:      Height:        Intake/Output Summary (Last 24 hours) at 04/21/2018 1202 Last data filed at 04/21/2018 1122 Gross per 24 hour  Intake 760.08 ml  Output 1100 ml  Net -339.92 ml   Filed Weights   04/20/18 1050  Weight: 54.4 kg    Exam:  . General: 84 y.o. year-old female well developed well nourished in no acute distress.  Alert and oriented x3. . Cardiovascular: Regular rate and rhythm with no rubs  or gallops.  No thyromegaly or JVD noted.   [name] Respiratory: Clear to auscultation with no wheezes or rales. Good inspiratory effort. . Abdomen: Soft nontender nondistended with normal bowel sounds x4 quadrants. . Musculoskeletal: No lower extremity edema. 2/4 pulses in all 4 extremities. . Skin: Right lateral leg with necrotic appearing area with surrounding cellulitis.  Good pulses 2 out of 4 in all 4  extremities.  No edema. [name] Psychiatry: Mood is appropriate for condition and setting   Data Reviewed: CBC: Recent Labs  Lab 04/20/18 1245 04/20/18 1256 04/21/18 0431  WBC 10.9*  --  9.7  NEUTROABS 8.3*  --   --   HGB 12.5 12.9 12.5  HCT 39.6 38.0 39.3  MCV 96.8  --  95.6  PLT 236  --  948   Basic Metabolic Panel: Recent Labs  Lab 04/20/18 1256 04/21/18 0431  NA 141 141  K 4.1 4.0  CL 106 107  CO2  --  25  GLUCOSE 99 109*  BUN 17 19  CREATININE 1.10* 1.15*  CALCIUM  --  9.1   GFR: Estimated Creatinine Clearance: 31.2 mL/min (A) (by C-G formula based on SCr of 1.15 mg/dL (H)). Liver Function Tests: Recent Labs  Lab 04/21/18 0431  AST 19  ALT 13  ALKPHOS 50  BILITOT 0.5  PROT 7.0  ALBUMIN 3.4*   No results for input(s): LIPASE, AMYLASE in the last 168 hours. No results for input(s): AMMONIA in the last 168 hours. Coagulation Profile: No results for input(s): INR, PROTIME in the last 168 hours. Cardiac Enzymes: No results for input(s): CKTOTAL, CKMB, CKMBINDEX, TROPONINI in the last 168 hours. BNP (last 3 results) No results for input(s): PROBNP in the last 8760 hours. HbA1C: No results for input(s): HGBA1C in the last 72 hours. CBG: No results for input(s): GLUCAP in the last 168 hours. Lipid Profile: No results for input(s): CHOL, HDL, LDLCALC, TRIG, CHOLHDL, LDLDIRECT in the last 72 hours. Thyroid Function Tests: No results for input(s): TSH, T4TOTAL, FREET4, T3FREE, THYROIDAB in the last 72 hours. Anemia Panel: No results for input(s): VITAMINB12, FOLATE, FERRITIN, TIBC, IRON, RETICCTPCT in the last 72 hours. Urine analysis:    Component Value Date/Time   COLORURINE YELLOW 09/03/2013 2308   APPEARANCEUR CLOUDY (A) 09/03/2013 2308   LABSPEC 1.018 09/03/2013 2308   PHURINE 5.5 09/03/2013 2308   GLUCOSEU NEGATIVE 09/03/2013 2308   HGBUR NEGATIVE 09/03/2013 2308   BILIRUBINUR NEGATIVE 09/03/2013 2308   KETONESUR NEGATIVE 09/03/2013 2308   PROTEINUR  NEGATIVE 09/03/2013 2308   UROBILINOGEN 1.0 09/03/2013 2308   NITRITE NEGATIVE 09/03/2013 2308   LEUKOCYTESUR NEGATIVE 09/03/2013 2308   Sepsis Labs: @LABRCNTIP (procalcitonin:4,lacticidven:4)  )No results found for this or any previous visit (from the past 240 hour(s)).    Studies: No results found.  Scheduled Meds: . amLODipine  10 mg Oral Daily  . clopidogrel  75 mg Oral QHS  . donepezil  5 mg Oral Daily  . enoxaparin (LOVENOX) injection  40 mg Subcutaneous Q24H  . levothyroxine  88 mcg Oral QAC breakfast    Continuous Infusions: . piperacillin-tazobactam (ZOSYN)  IV 3.375 g (04/21/18 1000)     LOS: 0 days     Kayleen Memos, MD [facility] Pager [phone]  If 7PM-7AM, please contact night-coverage [email or web address] Password [facility] 04/21/2018, 12:02 PM

## 2018-04-21 NOTE — Evaluation (Signed)
Physical Therapy Evaluation Patient Details Name: Tammie [name] MRN: [id number] DOB: 04-Aug-1933 Today's Date: 04/21/2018   History of Present Illness  Tammie [name] is a 84 y.o. female with medical history significant of hypothyroidism, dementia, hypertension, hyperlipidemia who presents to the hospital from her independent living facility with complaints of right lower extremity cellulitis after a cat scratch   Clinical Impression  Pt independent at her baseline, recently amb  with RW or cane in her apartment d/t LE injury; Pt amb 280' with RW and min/guard today, should be able to return to ILF, may need to amb with RW vs cane until at home RLE injury improved (pain and edema better); pt in agreement, doubt she will need f/u PT but did encourage her to attend some of her facility's exercise/movement classes; will see again as schedule allows    Follow Up Recommendations No PT follow up    Equipment Recommendations  None recommended by PT    Recommendations for Other Services       Precautions / Restrictions Precautions Precautions: Fall Restrictions Weight Bearing Restrictions: No      Mobility  Bed Mobility               General bed mobility comments: pt received in chair  Transfers Overall transfer level: Needs assistance Equipment used: Rolling walker (2 wheeled) Transfers: Sit to/from Stand Sit to Stand: Supervision         General transfer comment: for safety  Ambulation/Gait Ambulation/Gait assistance: Min guard Gait Distance (Feet): 280 Feet Assistive device: Rolling walker (2 wheeled) Gait Pattern/deviations: Step-through pattern;Decreased stride length     General Gait Details: cues for RW position, mild instability but no overt LOB; amb ~ 5 steps without LOB  Stairs            Wheelchair Mobility    Modified Rankin (Stroke Patients Only)       Balance Overall balance assessment: Needs assistance   Sitting balance-Leahy Scale:  Normal       Standing balance-Leahy Scale: Fair Standing balance comment: pt is able to maintain static standing without UE support, requires min/guard and at least unilateral UE support for wt shifting             High level balance activites: Side stepping;Head turns High Level Balance Comments: above with RW, no LOB             Pertinent Vitals/Pain Pain Assessment: Faces Faces Pain Scale: Hurts a little bit Pain Location: right leg Pain Descriptors / Indicators: Discomfort Pain Intervention(s): Monitored during session    Home Living Family/patient expects to be discharged to:: Assisted living               Home Equipment: Walker - 2 wheels;Cane - single point      Prior Function Level of Independence: Independent with assistive device(s);Independent         Comments: recently using cane or RW d/t pain     Hand Dominance        Extremity/Trunk Assessment   Upper Extremity Assessment Upper Extremity Assessment: Overall WFL for tasks assessed    Lower Extremity Assessment Lower Extremity Assessment: RLE deficits/detail RLE Deficits / Details: knee extension and flexion 3/5, limited by pain right lower leg       Communication   Communication: No difficulties;HOH  Cognition Arousal/Alertness: Awake/alert Behavior During Therapy: WFL for tasks assessed/performed Overall Cognitive Status: History of cognitive impairments - at baseline Area of Impairment: Memory;Attention  Current Attention Level: Selective Memory: Decreased short-term memory         General Comments: pt unable to recall where she resides (name),follows commands but requires/redirection to task frequently      General Comments      Exercises     Assessment/Plan    PT Assessment Patient needs continued PT services  PT Problem List Decreased mobility;Decreased activity tolerance;Decreased balance;Decreased knowledge of use of DME       PT  Treatment Interventions DME instruction;Therapeutic activities;Gait training;Functional mobility training;Therapeutic exercise;Patient/family education    PT Goals (Current goals can be found in the Care Plan section)  Acute Rehab PT Goals Patient Stated Goal: go home soon PT Goal Formulation: With patient Time For Goal Achievement: 04/28/18 Potential to Achieve Goals: Good    Frequency Min 3X/week   Barriers to discharge        Co-evaluation               AM-PAC PT "6 Clicks" Daily Activity  Outcome Measure Difficulty turning over in bed (including adjusting bedclothes, sheets and blankets)?: A Little Difficulty moving from lying on back to sitting on the side of the bed? : A Little Difficulty sitting down on and standing up from a chair with arms (e.g., wheelchair, bedside commode, etc,.)?: A Little Help needed moving to and from a bed to chair (including a wheelchair)?: A Little Help needed walking in hospital room?: A Little Help needed climbing 3-5 steps with a railing? : A Little 6 Click Score: 18    End of Session Equipment Utilized During Treatment: Gait belt Activity Tolerance: Patient tolerated treatment well Patient left: in chair;with call bell/phone within reach;with chair alarm set   PT Visit Diagnosis: Unsteadiness on feet (R26.81)    Time: 4970-2637 PT Time Calculation (min) (ACUTE ONLY): 27 min   Charges:   PT Evaluation $PT Eval Low Complexity: 1 Low PT Treatments $Gait Training: 8-22 mins        Kenyon Ana, PT Pager: [phone] 04/21/2018   [facility] [phone]   [facility] 04/21/2018, 3:43 PM

## 2018-04-22 ENCOUNTER — Inpatient Hospital Stay (HOSPITAL_COMMUNITY): Payer: Medicare Other

## 2018-04-22 LAB — MRSA PCR SCREENING: MRSA BY PCR: NEGATIVE

## 2018-04-22 NOTE — Progress Notes (Signed)
Physical Therapy Treatment Patient Details Name: Tammie [name] MRN: [id number] DOB: 02/23/1934 Today's Date: 04/22/2018    History of Present Illness Tammie [name] is a 84 y.o. female with medical history significant of hypothyroidism, dementia, hypertension, hyperlipidemia who presents to the hospital from her independent living facility with complaints of right lower extremity cellulitis after a cat scratch     PT Comments    Pt progressing toward goals; improving gait stability, able to amb with cane today (which she is more compliant with at home per her own report); encouraged pt to resume exercise classes once she returns home and pt agrees; no f/u needed at this time; pt denies any hx of recent falls (however she doe shave some memory deficits, no family to confirm)  Follow Up Recommendations  No PT follow up     Equipment Recommendations  None recommended by PT    Recommendations for Other Services       Precautions / Restrictions Precautions Precautions: Fall Restrictions Weight Bearing Restrictions: No    Mobility  Bed Mobility Overal bed mobility: Modified Independent             General bed mobility comments: incr time, effortful but no physical assist  Transfers Overall transfer level: Needs assistance Equipment used: Straight cane Transfers: Sit to/from Stand Sit to Stand: Supervision         General transfer comment: for safety  Ambulation/Gait Ambulation/Gait assistance: Min guard;Supervision Gait Distance (Feet): 300 Feet Assistive device: Straight cane Gait Pattern/deviations: Step-through pattern;Decreased stride length     General Gait Details: cues for proper useof cane, mild instability but no overt LOB; amb ~ 6 steps without AD and no LOB   Stairs             Wheelchair Mobility    Modified Rankin (Stroke Patients Only)       Balance     Sitting balance-Leahy Scale: Normal       Standing balance-Leahy Scale:  Fair               High level balance activites: Side stepping;Direction changes;Head turns;Turns High Level Balance Comments: mild instabilit but no overt LOB over even, smooth surface            Cognition Arousal/Alertness: Awake/alert Behavior During Therapy: WFL for tasks assessed/performed Overall Cognitive Status: History of cognitive impairments - at baseline Area of Impairment: Memory;Attention                   Current Attention Level: Selective Memory: Decreased short-term memory         General Comments: improving attention today although still requires occasional redirection to task, unabel to divide attention      Exercises      General Comments        Pertinent Vitals/Pain Pain Assessment: No/denies pain    Home Living                      Prior Function            PT Goals (current goals can now be found in the care plan section) Acute Rehab PT Goals Patient Stated Goal: go home soon PT Goal Formulation: With patient Time For Goal Achievement: 04/28/18 Potential to Achieve Goals: Good Progress towards PT goals: Progressing toward goals    Frequency    Min 3X/week      PT Plan Current plan remains appropriate    Co-evaluation  AM-PAC PT "6 Clicks" Daily Activity  Outcome Measure  Difficulty turning over in bed (including adjusting bedclothes, sheets and blankets)?: A Little Difficulty moving from lying on back to sitting on the side of the bed? : A Little Difficulty sitting down on and standing up from a chair with arms (e.g., wheelchair, bedside commode, etc,.)?: A Little Help needed moving to and from a bed to chair (including a wheelchair)?: A Little Help needed walking in hospital room?: A Little Help needed climbing 3-5 steps with a railing? : A Little 6 Click Score: 18    End of Session Equipment Utilized During Treatment: Gait belt Activity Tolerance: Patient tolerated treatment  well Patient left: in chair;with call bell/phone within reach;with chair alarm set   PT Visit Diagnosis: Unsteadiness on feet (R26.81)     Time: 8377-9396 PT Time Calculation (min) (ACUTE ONLY): 26 min  Charges:  $Gait Training: 23-37 mins                     Kenyon Ana, PT Pager: [phone] 04/22/2018   [facility] [phone]    [facility] 04/22/2018, 1:44 PM

## 2018-04-22 NOTE — Discharge Instructions (Signed)

## 2018-04-22 NOTE — Discharge Summary (Signed)
Discharge Summary  Tammie [name] YTK:[id number] DOB: 84 May 29, 1934  PCP: Lanice Shirts, MD  Admit date: 04/20/2018 Discharge date: 04/22/2018  Time spent: 25 minutes  Recommendations for Outpatient Follow-up:  1. Please follow-up with wound care 2. Take your medications as prescribed  Discharge Diagnoses:  Active Hospital Problems   Diagnosis Date Noted  . Cat bite 04/20/2018    Resolved Hospital Problems  No resolved problems to display.    Discharge Condition: Stable  Diet recommendation: Resume previous diet  Vitals:   04/21/18 2137 04/22/18 0637  BP: (!) 138/42 (!) 146/52  Pulse: (!) 51 (!) 43  Resp: 16 16  Temp: 98.7 F (37.1 C) 98.8 F (37.1 C)  SpO2: 95% 94%    History of present illness:  Tammie [name] a 84 y.o.femalewith medical history significant ofhypothyroidism, dementia, hypertension, hyperlipidemia who presents to the hospital from her independent living facility with complaints of right lower extremity cellulitis after a cat scratch 6 days prior. She was seen by aprovider at her independent living facility and was prescribed antibiotics few days ago. Not sure what antibiotic was prescribed. Given the fact that itwas getting bigger and bigger directed the patient to the hospital.  Admitted for acute cellulitis secondary to cat scratch.  04/21/2018: Patient seen and examined at bedside.  She is alert in the setting of dementia.  States her right lower extremity pain from her cellulitis is improved after she takes her pain medications.  Patient has no family members in [place] or in [place].  Her next of kin is her cousin Tammie [name] at [phone].  Spoke with her cousin on the phone.  After discharge patient would be going to her independent living facility where she lives alone and has someone who comes in a couple of hours a day to help with her medications.  Patient has meals delivered to her as she requires assistance with her  activities of daily living.  04/22/2018: Patient seen and examined at her bedside.  No acute events overnight.  Mild improvement over her right lower extremity cellulitis on IV antibiotics.  Obtain x-ray to rule out an abscess.  MRSA screening negative on 04/22/2018.    Hospital Course:  Active Problems:   Cat bite  Slow improvement right lower extremity cat scratch wound/cellulitis Received Tdap on 04/20/2018 Continue IV Zosyn Continue to monitor fever curve Continue to monitor WBC Wound care specialist consulted, awaiting recommendations Obtain CBC in the morning Obtain x-ray of the right lower extremity to rule out an abscess Continue to monitor vital signs  Hypertension Blood pressures well controlled Continue Norvasc  Coronary artery disease Denies chest pain Continue Plavix  Hypothyroidism Continue Synthroid  Dementia complicated by poor social support Patient has no family members in the area Has no children no husband only next of kin is her cousin who lives in a different state Lives alone and unable to take care of activities of daily living Unable to discharge to her independent living facility due to above-high risk for developing wound complications due to inability to take care of herself, and slow healing of wound.   Risks: High risk for decompensation due to cat scratch induced wound requiring IV antibiotics, multiple comorbidities, dementia with poor social support, and advanced age.   Code Status: Full code  Family Communication: Spoke with her next of kin on the phone.  Disposition Plan: To independent living facility possibly tomorrow 04/23/2018   Consultants:  Wound care specialist  Procedures:  None  Antimicrobials:  IV Zosyn  DVT prophylaxis: Subcu Lovenox    Discharge Exam: BP (!) 146/52 (BP Location: Left Arm)   Pulse (!) 43   Temp 98.8 F (37.1 C) (Oral)   Resp 16   Ht 5\' 3"  (1.6 m)   Wt 54.4 kg   SpO2 94%    BMI 21.26 kg/m  . General: 84 y.o. year-old female well developed well nourished in no acute distress.  Alert in the setting of dementia.   . Cardiovascular: Regular rate and rhythm with no rubs or gallops.  No thyromegaly or JVD noted.  [name] Respiratory: Clear to auscultation with no wheezes or rales. Good inspiratory effort. . Abdomen: Soft nontender nondistended with normal bowel sounds x4 quadrants. . Musculoskeletal: No lower extremity edema. 2/4 pulses in all 4 extremities. . Skin: 2 necrotic appearing areas on the lateral shin with surrounding cellulitis. [name] Psychiatry: Mood is appropriate for condition and setting  Discharge Instructions You were cared for by a hospitalist during your hospital stay. If you have any questions about your discharge medications or the care you received while you were in the hospital after you are discharged, you can call the unit and asked to speak with the hospitalist on call if the hospitalist that took care of you is not available. Once you are discharged, your primary care physician will handle any further medical issues. Please note that NO REFILLS for any discharge medications will be authorized once you are discharged, as it is imperative that you return to your primary care physician (or establish a relationship with a primary care physician if you do not have one) for your aftercare needs so that they can reassess your need for medications and monitor your lab values.   Allergies as of 04/22/2018   No Known Allergies     Medication List    STOP taking these medications   alendronate 70 MG tablet Commonly known as:  FOSAMAX   levofloxacin 500 MG tablet Commonly known as:  LEVAQUIN     TAKE these medications   amLODipine 10 MG tablet Commonly known as:  NORVASC Take 10 mg by mouth daily.   clopidogrel 75 MG tablet Commonly known as:  PLAVIX Take 75 mg by mouth at bedtime.   donepezil 5 MG tablet Commonly known as:  ARICEPT Take 5 mg by  mouth daily.   levothyroxine 88 MCG tablet Commonly known as:  SYNTHROID, LEVOTHROID Take 88 mcg by mouth daily.      No Known Allergies Follow-up Information    Schoenhoff, Altamese Cabal, MD. Call in 1 day(s).   Specialty:  Internal Medicine Why:  Please call for a post hospital follow-up appointment. Contact information: [address] [place] [address] [phone]            The results of significant diagnostics from this hospitalization (including imaging, microbiology, ancillary and laboratory) are listed below for reference.    Significant Diagnostic Studies: No results found.  Microbiology: Recent Results (from the past 240 hour(s))  MRSA PCR Screening     Status: None   Collection Time: 04/22/18  8:59 AM  Result Value Ref Range Status   MRSA by PCR NEGATIVE NEGATIVE Final    Comment:        The GeneXpert MRSA Assay (FDA approved for NASAL specimens only), is one component of a comprehensive MRSA colonization surveillance program. It is not intended to diagnose MRSA infection nor to guide or monitor treatment for MRSA infections. Performed at [facility]  [place] [address] [place], [place] [address]      Labs: Basic Metabolic Panel: Recent Labs  Lab 04/20/18 1256 04/21/18 0431  NA 141 141  K 4.1 4.0  CL 106 107  CO2  --  25  GLUCOSE 99 109*  BUN 17 19  CREATININE 1.10* 1.15*  CALCIUM  --  9.1   Liver Function Tests: Recent Labs  Lab 04/21/18 0431  AST 19  ALT 13  ALKPHOS 50  BILITOT 0.5  PROT 7.0  ALBUMIN 3.4*   No results for input(s): LIPASE, AMYLASE in the last 168 hours. No results for input(s): AMMONIA in the last 168 hours. CBC: Recent Labs  Lab 04/20/18 1245 04/20/18 1256 04/21/18 0431  WBC 10.9*  --  9.7  NEUTROABS 8.3*  --   --   HGB 12.5 12.9 12.5  HCT 39.6 38.0 39.3  MCV 96.8  --  95.6  PLT 236  --  229   Cardiac Enzymes: No results for input(s): CKTOTAL, CKMB, CKMBINDEX,  TROPONINI in the last 168 hours. BNP: BNP (last 3 results) No results for input(s): BNP in the last 8760 hours.  ProBNP (last 3 results) No results for input(s): PROBNP in the last 8760 hours.  CBG: No results for input(s): GLUCAP in the last 168 hours.     Signed:  Kayleen Memos, MD [facility] 04/22/2018, 10:57 AM

## 2018-04-23 LAB — CBC
HCT: 42.8 % (ref 36.0–46.0)
Hemoglobin: 13.8 g/dL (ref 12.0–15.0)
MCH: 31 pg (ref 26.0–34.0)
MCHC: 32.2 g/dL (ref 30.0–36.0)
MCV: 96.2 fL (ref 78.0–100.0)
Platelets: 283 10*3/uL (ref 150–400)
RBC: 4.45 MIL/uL (ref 3.87–5.11)
RDW: 13.4 % (ref 11.5–15.5)
WBC: 9.5 10*3/uL (ref 4.0–10.5)

## 2018-04-23 MED ORDER — MUPIROCIN CALCIUM 2 % EX CREA
TOPICAL_CREAM | Freq: Every day | CUTANEOUS | Status: DC
  Filled 2018-04-23: qty 15

## 2018-04-23 MED ORDER — AMOXICILLIN-POT CLAVULANATE 875-125 MG PO TABS: 1.0000 | ORAL_TABLET | Freq: Two times a day (BID) | ORAL | 0 refills | Status: AC

## 2018-04-23 MED ORDER — AMOXICILLIN-POT CLAVULANATE 875-125 MG PO TABS: 1.0000 | ORAL_TABLET | Freq: Two times a day (BID) | ORAL | Status: DC

## 2018-04-23 MED ORDER — MUPIROCIN CALCIUM 2 % EX CREA: TOPICAL_CREAM | Freq: Every day | CUTANEOUS | 0 refills | Status: DC

## 2018-04-23 NOTE — Care Management Note (Signed)
Case Management Note  Patient Details  Name: Tammie [name] MRN: [id number] Date of Birth: April 06, 1934  Subjective/Objective:   Per CSW, daughter has arranged care at [facility].                  Action/Plan: No further needs   Expected Discharge Date:  04/23/18               Expected Discharge Plan:  Home/Self Care  In-House Referral:  Clinical Social Work  Discharge planning Services  CM Consult  Post Acute Care Choice:  NA Choice offered to:  Patient, Adult Children  DME Arranged:  N/A DME Agency:  NA  HH Arranged:  NA HH Agency:  NA  Status of Service:  Completed, signed off  If discussed at [employer] of Stay Meetings, dates discussed:    Additional Comments:  Guadalupe Maple, RN 04/23/2018, 11:24 AM

## 2018-04-23 NOTE — Progress Notes (Signed)
Discharge instructions reviewed with patient, visitor who came to pick her up, and next of kin over the telephone. All questions answered. Patient wheeled to vehicle with belongings by nurse tech.

## 2018-04-23 NOTE — Consult Note (Addendum)
[facility] Nurse wound consult note Reason for Consult: Consult requested for right leg wound.  Pt states her cat scratched her and it became infected.  This happened in the past to her left leg, and she was followed by the outpatient wound care center until it healed, she states. Wound type: Right outer calf with full thickness wound; .8X.8cm, dark red, dry wound bed; no odor, drainage, or fluctuance.  Surrounded by 3 cm cellulitis; generalized erythremia and edema Dressing procedure/placement/frequency: Bactroban to provide antimicrobial benefits and promote healing. Suspect wound will evolve to dry eschar eventually and will require sharp debridement at that time; recommend follow-up at the outpatient wound care center after discharge. This must be by physician referral; please order if desired.  Please re-consult if further assistance is needed.  Thank-you,  Julien Girt MSN, [place], [place], [place], [place]

## 2018-04-23 NOTE — Clinical Social Work Note (Addendum)
Clinical Social Work Assessment  Patient Details  Name: Tammie [name] MRN: [id number] Date of Birth: Jun 15, 1934  Date of referral:  04/23/18               Reason for consult:                   Permission sought to share information with:    Permission granted to share information::  Yes, Verbal Permission Granted  Name::     [facility]  Agency::  [facility]  Relationship::  Cousin/POA  Contact Information:  [phone]  Housing/Transportation Living arrangements for the past 2 months:  [place] of Information:  Power of Attorney Patient Interpreter Needed:  None Criminal Activity/Legal Involvement Pertinent to Current Situation/Hospitalization:  No - Comment as needed Significant Relationships:  Other Family Members Lives with:  Facility Resident Do you feel safe going back to the place where you live?  Yes Need for family participation in patient care:  Yes (Comment)  Care giving concerns:   Patient presented from [place] at [facility] w/ right leg cellulitis. Patient was scratched quite significantly by her cat about 6 days ago and started developing an area of redness on her lateral shin.  [name] reports since this hospitalization a veterinarian has picked up the cat and will no longer live with the patient.    Social Worker assessment / plan:  Patient has dementia. CSW discussed discharge plan with patient cousin/POA [name]. The patient is a resident at [facility]. [name] reports the patient moved in March and has adjusted well to her environment. [name] reports the patient will eventually need memory, after speaking with staff on the [place] she will keep patient in the independent until the need to transition is warranted. [name] reports the patient is checked on twice a day. A nurse aide is arrange to assist the patient with her daily medications.  [name] reports the patient meals are delivered  or she eats in the cafeteria with friends. The patient can bathe and dress herself.   Patient will need daily dressing changes for her wound to heal properly. [name] is agreeable to have daily nurses come in to provide the care.  Patient friend will transport.   Plan: Return to [facility] w/ daily nursing staff for wound care.   Employment status:  Retired Nurse, adult PT Recommendations:  [place] / Referral to community resources:     Patient/Family's Response to care:  Responding well to care.   Patient/Family's Understanding of and Emotional Response to Diagnosis, Current Treatment, and Prognosis:  Patient cousin/POA very involved in the patient care. She has been proactive in arranging support and extra help at [facility].    Emotional Assessment Appearance:  Appears stated age Attitude/Demeanor/Rapport:    Affect (typically observed):  Calm, Pleasant Orientation:  Oriented to Self, Oriented to Place Alcohol / Substance use:  Not Applicable Psych involvement (Current and /or in the community):  No (Comment)  Discharge Needs  Concerns to be addressed:  Discharge Planning Concerns Readmission within the last 30 days:  No Current discharge risk:  None Barriers to Discharge:  No Barriers Identified   Lia Hopping, LCSW 04/23/2018, 10:05 AM

## 2018-04-23 NOTE — Discharge Summary (Addendum)
Discharge Summary  [name]:[id number] DOB: September 21, 1933  PCP: Lanice Shirts, MD  Admit date: 04/20/2018 Discharge date: 04/23/2018  Time spent: 25 minutes  Recommendations for Outpatient Follow-up:  1. Please follow-up with wound care 2. Take your medications as prescribed  Discharge Diagnoses:  Active Hospital Problems   Diagnosis Date Noted  . Cat bite 04/20/2018    Resolved Hospital Problems  No resolved problems to display.    Discharge Condition: Stable  Diet recommendation: Resume previous diet  Vitals:   04/23/18 0628 04/23/18 0917  BP: (!) 169/57 (!) 150/63  Pulse: (!) 52   Resp: 18   Temp: 98.3 F (36.8 C)   SpO2: 96%     History of present illness:  Tammie [name] a 84 y.o.femalewith medical history significant ofhypothyroidism, dementia, hypertension, hyperlipidemia who presents to the hospital from her independent living facility with complaints of right lower extremity cellulitis after a cat scratch 6 days prior. She was seen by aprovider at her independent living facility and was prescribed antibiotics few days ago. Not sure what antibiotic was prescribed. Given the fact that itwas getting bigger and bigger directed the patient to the hospital.  Admitted for acute cellulitis secondary to cat scratch.  04/21/2018: Patient seen and examined at bedside.  She is alert in the setting of dementia.  States her right lower extremity pain from her cellulitis is improved after she takes her pain medications.  Patient has no family members in [place] or in [place].  Her next of kin is her cousin Tammie [name] at [phone].  Spoke with her cousin on the phone.  After discharge patient would be going to her independent living facility where she lives alone and has someone who comes in a couple of hours a day to help with her medications.  Patient has meals delivered to her as she requires assistance with her activities of daily  living.  04/22/2018: Mild improvement over her right lower extremity cellulitis on IV antibiotics.  Obtain x-ray to rule out an abscess. Abscess ruled out. MRSA screening negative on 04/22/2018.  04/23/18: Patient seen and examined at her bedside.  Admits to pain on her right lower extremity cellulitis when she ambulates.  Wound care specialist examined her wound this morning with recommendations as outlined below.  [facility] Nurse wound consult note: Wound type: Right outer calf with full thickness wound; .8X.8cm, dark red, dry wound bed; no odor, drainage, or fluctuance.  Surrounded by 3 cm cellulitis; generalized erythremia and edema Dressing procedure/placement/frequency: Bactroban to provide antimicrobial benefits and promote healing. Suspect wound will evolve to dry eschar eventually and will require sharp debridement at that time; recommend follow-up at the outpatient wound care center after discharge. This must be by physician referral; please order if desired.  Please re-consult if further assistance is needed.  [facility] Course:  Active Problems:   Cat bite  Right lower extremity cat scratch wound/cellulitis, improving Received Tdap on 04/20/2018 Completed 4 days IV Zosyn Start augmentin 1 tab BID x 5 days Wound care specialist saw the patient with recommendations as stated above No sign of systemic infection Afebrile with no leukocytosis  Hypertension Blood pressures well controlled Continue Norvasc  Coronary artery disease Denies chest pain Continue Plavix  Hypothyroidism Continue Synthroid  Dementia complicated by poor social support Patient has no family members in the area Has no children no husband only next of kin is her cousin who lives in a different state Lives alone and unable to  take care of activities of daily living  Per her cousin Ms. Tammie [name] patient will continue to receive nursing assistance at her independent living facility including wound  care.  Patient will need daily dressing changes for her wound to heal properly. Tammie [name] is agreeable to have daily nurses come in to provide the care.     Code Status: Full code   Consultants:  Wound care specialist  Procedures:  None  Antimicrobials:  IV Zosyn  DVT prophylaxis: Subcu Lovenox    Discharge Exam: BP (!) 150/63   Pulse (!) 52   Temp 98.3 F (36.8 C) (Oral)   Resp 18   Ht 5\' 3"  (1.6 m)   Wt 54.4 kg   SpO2 96%   BMI 21.26 kg/m  . General: 84 y.o. year-old female well developed well nourished in no acute distress.  Alert in the setting of dementia.   . Cardiovascular: Regular rate and rhythm with no rubs or gallops.  No thyromegaly or JVD noted.  [name] Respiratory: Clear to auscultation with no wheezes or rales. Good inspiratory effort. . Abdomen: Soft nontender nondistended with normal bowel sounds x4 quadrants. . Musculoskeletal: No lower extremity edema. 2/4 pulses in all 4 extremities. . Skin: 2 necrotic appearing areas on the lateral shin with surrounding cellulitis. [name] Psychiatry: Mood is appropriate for condition and setting  Discharge Instructions You were cared for by a hospitalist during your hospital stay. If you have any questions about your discharge medications or the care you received while you were in the hospital after you are discharged, you can call the unit and asked to speak with the hospitalist on call if the hospitalist that took care of you is not available. Once you are discharged, your primary care physician will handle any further medical issues. Please note that NO REFILLS for any discharge medications will be authorized once you are discharged, as it is imperative that you return to your primary care physician (or establish a relationship with a primary care physician if you do not have one) for your aftercare needs so that they can reassess your need for medications and monitor your lab values.   Allergies as of 04/23/2018   No  Known Allergies     Medication List    STOP taking these medications   alendronate 70 MG tablet Commonly known as:  FOSAMAX   levofloxacin 500 MG tablet Commonly known as:  LEVAQUIN     TAKE these medications   amLODipine 10 MG tablet Commonly known as:  NORVASC Take 10 mg by mouth daily.   amoxicillin-clavulanate 875-125 MG tablet Commonly known as:  AUGMENTIN Take 1 tablet by mouth every 12 (twelve) hours for 5 days.   clopidogrel 75 MG tablet Commonly known as:  PLAVIX Take 75 mg by mouth at bedtime.   donepezil 5 MG tablet Commonly known as:  ARICEPT Take 5 mg by mouth daily.   levothyroxine 88 MCG tablet Commonly known as:  SYNTHROID, LEVOTHROID Take 88 mcg by mouth daily.   mupirocin cream 2 % Commonly known as:  BACTROBAN Apply topically daily.      No Known Allergies Follow-up Information    Schoenhoff, Altamese Cabal, MD. Call in 1 day(s).   Specialty:  Internal Medicine Why:  Please call for a post hospital follow-up appointment. Contact information: [address] [place] [address] [phone]        [facility]             .  Call in 1 day(s).   Why:  Please call for post hospital follow-up appointment Contact information: [place] [address] [phone]           The results of significant diagnostics from this hospitalization (including imaging, microbiology, ancillary and laboratory) are listed below for reference.    Significant Diagnostic Studies: Dg Tibia/fibula Right  Result Date: 04/22/2018 CLINICAL DATA:  Cat scratch that has become infected/swollen. anteriolateral near mid shaft EXAM: RIGHT TIBIA AND FIBULA - 2 VIEW COMPARISON:  None. FINDINGS: No fracture. No bone lesion. No evidence of osteomyelitis. Bones are demineralized. Knee and ankle joints are normally spaced and aligned. There is subcutaneous soft tissue edema.  No soft tissue  air. IMPRESSION: 1. No fracture or bone lesion.  No evidence of osteomyelitis. 2. Soft tissue edema and/or cellulitis.  No soft tissue air. Electronically Signed   By: Lajean Manes M.D.   On: 04/22/2018 11:49    Microbiology: Recent Results (from the past 240 hour(s))  MRSA PCR Screening     Status: None   Collection Time: 04/22/18  8:59 AM  Result Value Ref Range Status   MRSA by PCR NEGATIVE NEGATIVE Final    Comment:        The GeneXpert MRSA Assay (FDA approved for NASAL specimens only), is one component of a comprehensive MRSA colonization surveillance program. It is not intended to diagnose MRSA infection nor to guide or monitor treatment for MRSA infections. Performed at [facility], [place] [address] [place], [place] [address]      Labs: Basic Metabolic Panel: Recent Labs  Lab 04/20/18 1256 04/21/18 0431  NA 141 141  K 4.1 4.0  CL 106 107  CO2  --  25  GLUCOSE 99 109*  BUN 17 19  CREATININE 1.10* 1.15*  CALCIUM  --  9.1   Liver Function Tests: Recent Labs  Lab 04/21/18 0431  AST 19  ALT 13  ALKPHOS 50  BILITOT 0.5  PROT 7.0  ALBUMIN 3.4*   No results for input(s): LIPASE, AMYLASE in the last 168 hours. No results for input(s): AMMONIA in the last 168 hours. CBC: Recent Labs  Lab 04/20/18 1245 04/20/18 1256 04/21/18 0431 04/23/18 0647  WBC 10.9*  --  9.7 9.5  NEUTROABS 8.3*  --   --   --   HGB 12.5 12.9 12.5 13.8  HCT 39.6 38.0 39.3 42.8  MCV 96.8  --  95.6 96.2  PLT 236  --  229 283   Cardiac Enzymes: No results for input(s): CKTOTAL, CKMB, CKMBINDEX, TROPONINI in the last 168 hours. BNP: BNP (last 3 results) No results for input(s): BNP in the last 8760 hours.  ProBNP (last 3 results) No results for input(s): PROBNP in the last 8760 hours.  CBG: No results for input(s): GLUCAP in the last 168 hours.     Signed:  Kayleen Memos, MD [facility] 04/23/2018, 11:21 AM

## 2018-04-25 DIAGNOSIS — S81831A Puncture wound without foreign body, right lower leg, initial encounter: Secondary | ICD-10-CM | POA: Diagnosis not present

## 2018-04-25 DIAGNOSIS — L03115 Cellulitis of right lower limb: Secondary | ICD-10-CM | POA: Diagnosis not present

## 2018-04-25 DIAGNOSIS — W5501XD Bitten by cat, subsequent encounter: Secondary | ICD-10-CM | POA: Diagnosis not present

## 2018-04-29 DIAGNOSIS — L03114 Cellulitis of left upper limb: Secondary | ICD-10-CM | POA: Diagnosis not present

## 2018-04-30 DIAGNOSIS — M79642 Pain in left hand: Secondary | ICD-10-CM | POA: Diagnosis not present

## 2018-05-01 DIAGNOSIS — M109 Gout, unspecified: Secondary | ICD-10-CM | POA: Diagnosis not present

## 2018-05-08 ENCOUNTER — Encounter (HOSPITAL_BASED_OUTPATIENT_CLINIC_OR_DEPARTMENT_OTHER): Payer: Self-pay

## 2018-05-08 ENCOUNTER — Encounter (HOSPITAL_BASED_OUTPATIENT_CLINIC_OR_DEPARTMENT_OTHER): Payer: Medicare Other | Attending: Internal Medicine

## 2018-05-08 DIAGNOSIS — Z87891 Personal history of nicotine dependence: Secondary | ICD-10-CM | POA: Insufficient documentation

## 2018-05-08 DIAGNOSIS — L03115 Cellulitis of right lower limb: Secondary | ICD-10-CM | POA: Insufficient documentation

## 2018-05-08 DIAGNOSIS — L97212 Non-pressure chronic ulcer of right calf with fat layer exposed: Secondary | ICD-10-CM | POA: Diagnosis not present

## 2018-05-08 DIAGNOSIS — L97812 Non-pressure chronic ulcer of other part of right lower leg with fat layer exposed: Secondary | ICD-10-CM | POA: Insufficient documentation

## 2018-05-08 DIAGNOSIS — Z853 Personal history of malignant neoplasm of breast: Secondary | ICD-10-CM | POA: Diagnosis not present

## 2018-05-08 DIAGNOSIS — I10 Essential (primary) hypertension: Secondary | ICD-10-CM | POA: Insufficient documentation

## 2018-05-08 DIAGNOSIS — L539 Erythematous condition, unspecified: Secondary | ICD-10-CM | POA: Diagnosis not present

## 2018-05-08 DIAGNOSIS — S81802A Unspecified open wound, left lower leg, initial encounter: Secondary | ICD-10-CM | POA: Diagnosis not present

## 2018-05-08 DIAGNOSIS — S81851A Open bite, right lower leg, initial encounter: Secondary | ICD-10-CM | POA: Diagnosis not present

## 2018-05-08 DIAGNOSIS — Z9221 Personal history of antineoplastic chemotherapy: Secondary | ICD-10-CM | POA: Diagnosis not present

## 2018-05-08 DIAGNOSIS — Z923 Personal history of irradiation: Secondary | ICD-10-CM | POA: Insufficient documentation

## 2018-05-15 DIAGNOSIS — S81851A Open bite, right lower leg, initial encounter: Secondary | ICD-10-CM | POA: Diagnosis not present

## 2018-05-15 DIAGNOSIS — I10 Essential (primary) hypertension: Secondary | ICD-10-CM | POA: Diagnosis not present

## 2018-05-15 DIAGNOSIS — Z87891 Personal history of nicotine dependence: Secondary | ICD-10-CM | POA: Diagnosis not present

## 2018-05-15 DIAGNOSIS — Z853 Personal history of malignant neoplasm of breast: Secondary | ICD-10-CM | POA: Diagnosis not present

## 2018-05-15 DIAGNOSIS — Z923 Personal history of irradiation: Secondary | ICD-10-CM | POA: Diagnosis not present

## 2018-05-15 DIAGNOSIS — L03115 Cellulitis of right lower limb: Secondary | ICD-10-CM | POA: Diagnosis not present

## 2018-05-15 DIAGNOSIS — Z9221 Personal history of antineoplastic chemotherapy: Secondary | ICD-10-CM | POA: Diagnosis not present

## 2018-05-15 DIAGNOSIS — L97812 Non-pressure chronic ulcer of other part of right lower leg with fat layer exposed: Secondary | ICD-10-CM | POA: Diagnosis not present

## 2018-05-15 DIAGNOSIS — L97212 Non-pressure chronic ulcer of right calf with fat layer exposed: Secondary | ICD-10-CM | POA: Diagnosis not present

## 2018-05-23 DIAGNOSIS — Z87891 Personal history of nicotine dependence: Secondary | ICD-10-CM | POA: Diagnosis not present

## 2018-05-23 DIAGNOSIS — L97812 Non-pressure chronic ulcer of other part of right lower leg with fat layer exposed: Secondary | ICD-10-CM | POA: Diagnosis not present

## 2018-05-23 DIAGNOSIS — S81801A Unspecified open wound, right lower leg, initial encounter: Secondary | ICD-10-CM | POA: Diagnosis not present

## 2018-05-23 DIAGNOSIS — I10 Essential (primary) hypertension: Secondary | ICD-10-CM | POA: Diagnosis not present

## 2018-05-23 DIAGNOSIS — Z853 Personal history of malignant neoplasm of breast: Secondary | ICD-10-CM | POA: Diagnosis not present

## 2018-05-23 DIAGNOSIS — L03115 Cellulitis of right lower limb: Secondary | ICD-10-CM | POA: Diagnosis not present

## 2018-05-23 DIAGNOSIS — L97212 Non-pressure chronic ulcer of right calf with fat layer exposed: Secondary | ICD-10-CM | POA: Diagnosis not present

## 2018-05-23 DIAGNOSIS — Z9221 Personal history of antineoplastic chemotherapy: Secondary | ICD-10-CM | POA: Diagnosis not present

## 2018-05-23 DIAGNOSIS — Z923 Personal history of irradiation: Secondary | ICD-10-CM | POA: Diagnosis not present

## 2018-06-04 ENCOUNTER — Encounter (HOSPITAL_BASED_OUTPATIENT_CLINIC_OR_DEPARTMENT_OTHER): Payer: Medicare Other | Attending: Internal Medicine

## 2018-06-04 DIAGNOSIS — L97812 Non-pressure chronic ulcer of other part of right lower leg with fat layer exposed: Secondary | ICD-10-CM | POA: Insufficient documentation

## 2018-06-04 DIAGNOSIS — Z923 Personal history of irradiation: Secondary | ICD-10-CM | POA: Diagnosis not present

## 2018-06-04 DIAGNOSIS — I10 Essential (primary) hypertension: Secondary | ICD-10-CM | POA: Insufficient documentation

## 2018-06-04 DIAGNOSIS — Z9221 Personal history of antineoplastic chemotherapy: Secondary | ICD-10-CM | POA: Diagnosis not present

## 2018-06-04 DIAGNOSIS — S81851A Open bite, right lower leg, initial encounter: Secondary | ICD-10-CM | POA: Diagnosis not present

## 2018-06-18 DIAGNOSIS — I10 Essential (primary) hypertension: Secondary | ICD-10-CM | POA: Diagnosis not present

## 2018-06-18 DIAGNOSIS — Z9221 Personal history of antineoplastic chemotherapy: Secondary | ICD-10-CM | POA: Diagnosis not present

## 2018-06-18 DIAGNOSIS — L97812 Non-pressure chronic ulcer of other part of right lower leg with fat layer exposed: Secondary | ICD-10-CM | POA: Diagnosis not present

## 2018-06-18 DIAGNOSIS — Z923 Personal history of irradiation: Secondary | ICD-10-CM | POA: Diagnosis not present

## 2018-06-18 DIAGNOSIS — S81851A Open bite, right lower leg, initial encounter: Secondary | ICD-10-CM | POA: Diagnosis not present

## 2018-06-25 DIAGNOSIS — R229 Localized swelling, mass and lump, unspecified: Secondary | ICD-10-CM | POA: Diagnosis not present

## 2018-06-25 DIAGNOSIS — L03114 Cellulitis of left upper limb: Secondary | ICD-10-CM | POA: Diagnosis not present

## 2018-06-25 DIAGNOSIS — E039 Hypothyroidism, unspecified: Secondary | ICD-10-CM | POA: Diagnosis not present

## 2018-06-25 DIAGNOSIS — L03115 Cellulitis of right lower limb: Secondary | ICD-10-CM | POA: Diagnosis not present

## 2018-07-10 DIAGNOSIS — H04123 Dry eye syndrome of bilateral lacrimal glands: Secondary | ICD-10-CM | POA: Diagnosis not present

## 2018-07-10 DIAGNOSIS — Z961 Presence of intraocular lens: Secondary | ICD-10-CM | POA: Diagnosis not present

## 2018-07-16 DIAGNOSIS — L918 Other hypertrophic disorders of the skin: Secondary | ICD-10-CM | POA: Diagnosis not present

## 2018-10-18 ENCOUNTER — Ambulatory Visit: Payer: Medicare Other | Admitting: Family

## 2018-10-18 ENCOUNTER — Encounter (HOSPITAL_COMMUNITY): Payer: Medicare Other

## 2018-10-23 ENCOUNTER — Encounter (HOSPITAL_COMMUNITY): Payer: Medicare Other

## 2018-10-23 ENCOUNTER — Ambulatory Visit: Payer: Medicare Other | Admitting: Family

## 2018-12-21 ENCOUNTER — Other Ambulatory Visit: Payer: Self-pay | Admitting: Internal Medicine

## 2018-12-21 DIAGNOSIS — Z1231 Encounter for screening mammogram for malignant neoplasm of breast: Secondary | ICD-10-CM

## 2019-01-01 ENCOUNTER — Other Ambulatory Visit: Payer: Self-pay

## 2019-01-01 DIAGNOSIS — I6523 Occlusion and stenosis of bilateral carotid arteries: Secondary | ICD-10-CM

## 2019-01-08 ENCOUNTER — Encounter (HOSPITAL_COMMUNITY): Payer: Medicare Other

## 2019-01-08 ENCOUNTER — Ambulatory Visit: Payer: Self-pay

## 2019-01-09 ENCOUNTER — Other Ambulatory Visit: Payer: Self-pay

## 2019-01-09 DIAGNOSIS — I779 Disorder of arteries and arterioles, unspecified: Secondary | ICD-10-CM

## 2019-01-09 DIAGNOSIS — I6523 Occlusion and stenosis of bilateral carotid arteries: Secondary | ICD-10-CM

## 2019-01-11 ENCOUNTER — Ambulatory Visit (HOSPITAL_COMMUNITY)
Admission: RE | Admit: 2019-01-11 | Discharge: 2019-01-11 | Disposition: A | Discharge status: Home / Self Care | Payer: Medicare Other | Source: Ambulatory Visit | Attending: Family | Admitting: Family

## 2019-01-11 ENCOUNTER — Ambulatory Visit (INDEPENDENT_AMBULATORY_CARE_PROVIDER_SITE_OTHER): Payer: Medicare Other | Admitting: Physician Assistant

## 2019-01-11 ENCOUNTER — Other Ambulatory Visit: Payer: Self-pay

## 2019-01-11 DIAGNOSIS — I6523 Occlusion and stenosis of bilateral carotid arteries: Secondary | ICD-10-CM | POA: Diagnosis not present

## 2019-01-11 DIAGNOSIS — I779 Disorder of arteries and arterioles, unspecified: Secondary | ICD-10-CM

## 2019-01-11 DIAGNOSIS — I6529 Occlusion and stenosis of unspecified carotid artery: Secondary | ICD-10-CM | POA: Insufficient documentation

## 2019-01-11 NOTE — Progress Notes (Signed)
    Established Carotid Patient   History of Present Illness   Tammie [name] is a 85 y.o. (Jun 08, 1934) female who presents with chief complaint: Routine follow-up for carotid artery stenosis.  Previous carotid studies demonstrated: RICA 1 to 09% stenosis, LICA 40 to 23% stenosis.  Patient has no history of TIA or stroke symptom.  The patient has not had amaurosis fugax or monocular blindness.  The patient has not had facial drooping or hemiplegia.  The patient has not had receptive or expressive aphasia.  She quit smoking about 4 years ago.  Surgical history significant for repair of fractured right hip in 2015.  She has some weakness and balance issues however uses a walker to get around.  She is living in assistant living facility locally.   Current Outpatient Medications  Medication Sig Dispense Refill  . amLODipine (NORVASC) 10 MG tablet Take 10 mg by mouth daily.      . clopidogrel (PLAVIX) 75 MG tablet Take 75 mg by mouth at bedtime.     . donepezil (ARICEPT) 5 MG tablet Take 5 mg by mouth daily.  0  . levothyroxine (SYNTHROID, LEVOTHROID) 88 MCG tablet Take 88 mcg by mouth daily.    . mupirocin cream (BACTROBAN) 2 % Apply topically daily. 15 g 0   No current facility-administered medications for this visit.     On ROS today: 10 system ROS is otherwise negative   Physical Examination   Vitals:   01/11/19 1137 01/11/19 1142  BP: (!) 168/52 (!) 163/72  Pulse: (!) 43 (!) 43  Resp: 14   Temp: 98.2 F (36.8 C)   TempSrc: Temporal   SpO2: 99%   Weight: 115 lb (52.2 kg)   Height: 5\' 2"  (1.575 m)    Body mass index is 21.03 kg/m.  General Alert, O x 3, WD, NAD  Neck Supple, mid-line trachea,    Pulmonary Sym exp, good B air movt  Cardiac RRR, Nl S1, S2  Vascular Vessel Right Left  Radial Palpable Palpable    Musculo- skeletal M/S 5/5 throughout  , Extremities without ischemic changes    Neurologic Cranial nerves 2-12 intact     Non-Invasive Vascular Imaging    B Carotid Duplex (01/11/19):   R ICA stenosis:  1-39%  R VA:  patent and antegrade  L ICA stenosis:  40-59%  L VA:  patent and antegrade   Medical Decision Making   Tammie [name] is a 86 y.o. female who presents for routine follow-up of carotid artery stenosis   Carotid duplex unchanged over the past 5 or so years with right ICA 1 to 39% stenosis and left ICA 40 to 59% stenosis  No indication for revascularization of left ICA  Continue aspirin and statin  Recheck carotid duplex in 1 year   Dagoberto Ligas PA-C [facility] Office: [phone]  Clinic MD: Dr. Donzetta Matters

## 2019-01-16 DIAGNOSIS — I10 Essential (primary) hypertension: Secondary | ICD-10-CM | POA: Diagnosis not present

## 2019-01-16 DIAGNOSIS — E039 Hypothyroidism, unspecified: Secondary | ICD-10-CM | POA: Diagnosis not present

## 2019-01-16 DIAGNOSIS — Z0000 Encounter for general adult medical examination without abnormal findings: Secondary | ICD-10-CM | POA: Diagnosis not present

## 2019-01-16 DIAGNOSIS — E785 Hyperlipidemia, unspecified: Secondary | ICD-10-CM | POA: Diagnosis not present

## 2019-01-31 DIAGNOSIS — I6523 Occlusion and stenosis of bilateral carotid arteries: Secondary | ICD-10-CM | POA: Diagnosis not present

## 2019-01-31 DIAGNOSIS — I10 Essential (primary) hypertension: Secondary | ICD-10-CM | POA: Diagnosis not present

## 2019-01-31 DIAGNOSIS — E039 Hypothyroidism, unspecified: Secondary | ICD-10-CM | POA: Diagnosis not present

## 2019-01-31 DIAGNOSIS — R7303 Prediabetes: Secondary | ICD-10-CM | POA: Diagnosis not present

## 2019-02-12 ENCOUNTER — Other Ambulatory Visit: Payer: Self-pay

## 2019-02-12 ENCOUNTER — Ambulatory Visit
Admission: RE | Admit: 2019-02-12 | Discharge: 2019-02-12 | Disposition: A | Discharge status: Home / Self Care | Payer: Medicare Other | Source: Ambulatory Visit | Attending: Internal Medicine | Admitting: Internal Medicine

## 2019-02-12 DIAGNOSIS — Z1231 Encounter for screening mammogram for malignant neoplasm of breast: Secondary | ICD-10-CM | POA: Diagnosis not present

## 2019-02-14 ENCOUNTER — Other Ambulatory Visit: Payer: Self-pay | Admitting: Internal Medicine

## 2019-02-14 DIAGNOSIS — R928 Other abnormal and inconclusive findings on diagnostic imaging of breast: Secondary | ICD-10-CM

## 2019-02-19 ENCOUNTER — Ambulatory Visit: Payer: Medicare Other

## 2019-02-19 ENCOUNTER — Ambulatory Visit
Admission: RE | Admit: 2019-02-19 | Discharge: 2019-02-19 | Disposition: A | Discharge status: Home / Self Care | Payer: Medicare Other | Source: Ambulatory Visit | Attending: Internal Medicine | Admitting: Internal Medicine

## 2019-02-19 ENCOUNTER — Other Ambulatory Visit: Payer: Self-pay | Admitting: Internal Medicine

## 2019-02-19 ENCOUNTER — Other Ambulatory Visit: Payer: Self-pay

## 2019-02-19 DIAGNOSIS — R928 Other abnormal and inconclusive findings on diagnostic imaging of breast: Secondary | ICD-10-CM

## 2019-02-19 DIAGNOSIS — N6459 Other signs and symptoms in breast: Secondary | ICD-10-CM | POA: Diagnosis not present

## 2019-03-04 DIAGNOSIS — N6459 Other signs and symptoms in breast: Secondary | ICD-10-CM | POA: Diagnosis not present

## 2019-03-04 DIAGNOSIS — D472 Monoclonal gammopathy: Secondary | ICD-10-CM | POA: Diagnosis not present

## 2019-03-04 DIAGNOSIS — R413 Other amnesia: Secondary | ICD-10-CM | POA: Diagnosis not present

## 2019-03-04 DIAGNOSIS — Z7901 Long term (current) use of anticoagulants: Secondary | ICD-10-CM | POA: Diagnosis not present

## 2019-03-05 ENCOUNTER — Other Ambulatory Visit: Payer: Self-pay | Admitting: General Surgery

## 2019-03-05 DIAGNOSIS — N6459 Other signs and symptoms in breast: Secondary | ICD-10-CM

## 2019-04-02 ENCOUNTER — Ambulatory Visit
Admission: RE | Admit: 2019-04-02 | Discharge: 2019-04-02 | Disposition: A | Discharge status: Home / Self Care | Payer: Medicare Other | Source: Ambulatory Visit | Attending: General Surgery | Admitting: General Surgery

## 2019-04-02 ENCOUNTER — Other Ambulatory Visit: Payer: Self-pay

## 2019-04-02 DIAGNOSIS — N6459 Other signs and symptoms in breast: Secondary | ICD-10-CM

## 2019-04-02 DIAGNOSIS — N6452 Nipple discharge: Secondary | ICD-10-CM | POA: Diagnosis not present

## 2019-04-02 DIAGNOSIS — Z853 Personal history of malignant neoplasm of breast: Secondary | ICD-10-CM | POA: Diagnosis not present

## 2019-04-02 MED ORDER — GADOBUTROL 1 MMOL/ML IV SOLN
6.0000 mL | Freq: Once | INTRAVENOUS | Status: AC | PRN
  Administered 2019-04-02: 6 mL via INTRAVENOUS

## 2019-04-04 ENCOUNTER — Other Ambulatory Visit: Payer: Self-pay | Admitting: General Surgery

## 2019-04-04 DIAGNOSIS — D242 Benign neoplasm of left breast: Secondary | ICD-10-CM | POA: Diagnosis not present

## 2019-04-04 DIAGNOSIS — L988 Other specified disorders of the skin and subcutaneous tissue: Secondary | ICD-10-CM | POA: Diagnosis not present

## 2019-04-10 ENCOUNTER — Other Ambulatory Visit: Payer: Self-pay | Admitting: General Surgery

## 2019-04-10 DIAGNOSIS — R9389 Abnormal findings on diagnostic imaging of other specified body structures: Secondary | ICD-10-CM

## 2019-04-12 ENCOUNTER — Other Ambulatory Visit: Payer: Self-pay | Admitting: General Surgery

## 2019-04-12 ENCOUNTER — Ambulatory Visit
Admission: RE | Admit: 2019-04-12 | Discharge: 2019-04-12 | Disposition: A | Discharge status: Home / Self Care | Payer: Medicare Other | Source: Ambulatory Visit | Attending: General Surgery | Admitting: General Surgery

## 2019-04-12 ENCOUNTER — Other Ambulatory Visit: Payer: Self-pay

## 2019-04-12 DIAGNOSIS — R928 Other abnormal and inconclusive findings on diagnostic imaging of breast: Secondary | ICD-10-CM | POA: Diagnosis not present

## 2019-04-12 DIAGNOSIS — N6012 Diffuse cystic mastopathy of left breast: Secondary | ICD-10-CM | POA: Diagnosis not present

## 2019-04-12 DIAGNOSIS — R9389 Abnormal findings on diagnostic imaging of other specified body structures: Secondary | ICD-10-CM

## 2019-04-12 DIAGNOSIS — N6489 Other specified disorders of breast: Secondary | ICD-10-CM | POA: Diagnosis not present

## 2019-04-12 MED ORDER — GADOBUTROL 1 MMOL/ML IV SOLN
6.0000 mL | Freq: Once | INTRAVENOUS | Status: AC | PRN
  Administered 2019-04-12: 6 mL via INTRAVENOUS

## 2019-04-25 DIAGNOSIS — N6459 Other signs and symptoms in breast: Secondary | ICD-10-CM | POA: Diagnosis not present

## 2019-04-25 DIAGNOSIS — Z853 Personal history of malignant neoplasm of breast: Secondary | ICD-10-CM | POA: Diagnosis not present

## 2019-04-25 DIAGNOSIS — Z7901 Long term (current) use of anticoagulants: Secondary | ICD-10-CM | POA: Diagnosis not present

## 2019-04-25 DIAGNOSIS — R413 Other amnesia: Secondary | ICD-10-CM | POA: Diagnosis not present

## 2019-06-05 DIAGNOSIS — I10 Essential (primary) hypertension: Secondary | ICD-10-CM | POA: Diagnosis not present

## 2019-06-05 DIAGNOSIS — R7303 Prediabetes: Secondary | ICD-10-CM | POA: Diagnosis not present

## 2019-06-05 DIAGNOSIS — R928 Other abnormal and inconclusive findings on diagnostic imaging of breast: Secondary | ICD-10-CM | POA: Diagnosis not present

## 2019-06-05 DIAGNOSIS — I6523 Occlusion and stenosis of bilateral carotid arteries: Secondary | ICD-10-CM | POA: Diagnosis not present

## 2019-09-17 DIAGNOSIS — R488 Other symbolic dysfunctions: Secondary | ICD-10-CM | POA: Diagnosis not present

## 2019-09-17 DIAGNOSIS — M6281 Muscle weakness (generalized): Secondary | ICD-10-CM | POA: Diagnosis not present

## 2019-09-18 DIAGNOSIS — R488 Other symbolic dysfunctions: Secondary | ICD-10-CM | POA: Diagnosis not present

## 2019-09-18 DIAGNOSIS — M6281 Muscle weakness (generalized): Secondary | ICD-10-CM | POA: Diagnosis not present

## 2019-09-21 DIAGNOSIS — M6281 Muscle weakness (generalized): Secondary | ICD-10-CM | POA: Diagnosis not present

## 2019-09-21 DIAGNOSIS — R488 Other symbolic dysfunctions: Secondary | ICD-10-CM | POA: Diagnosis not present

## 2019-09-23 DIAGNOSIS — M6281 Muscle weakness (generalized): Secondary | ICD-10-CM | POA: Diagnosis not present

## 2019-09-23 DIAGNOSIS — R488 Other symbolic dysfunctions: Secondary | ICD-10-CM | POA: Diagnosis not present

## 2019-09-25 DIAGNOSIS — M6281 Muscle weakness (generalized): Secondary | ICD-10-CM | POA: Diagnosis not present

## 2019-09-25 DIAGNOSIS — R488 Other symbolic dysfunctions: Secondary | ICD-10-CM | POA: Diagnosis not present

## 2019-09-26 DIAGNOSIS — R488 Other symbolic dysfunctions: Secondary | ICD-10-CM | POA: Diagnosis not present

## 2019-09-26 DIAGNOSIS — M6281 Muscle weakness (generalized): Secondary | ICD-10-CM | POA: Diagnosis not present

## 2019-09-30 DIAGNOSIS — M6281 Muscle weakness (generalized): Secondary | ICD-10-CM | POA: Diagnosis not present

## 2019-09-30 DIAGNOSIS — R488 Other symbolic dysfunctions: Secondary | ICD-10-CM | POA: Diagnosis not present

## 2019-10-01 DIAGNOSIS — M6281 Muscle weakness (generalized): Secondary | ICD-10-CM | POA: Diagnosis not present

## 2019-10-01 DIAGNOSIS — R488 Other symbolic dysfunctions: Secondary | ICD-10-CM | POA: Diagnosis not present

## 2019-10-02 DIAGNOSIS — M6281 Muscle weakness (generalized): Secondary | ICD-10-CM | POA: Diagnosis not present

## 2019-10-02 DIAGNOSIS — R488 Other symbolic dysfunctions: Secondary | ICD-10-CM | POA: Diagnosis not present

## 2019-10-07 DIAGNOSIS — R488 Other symbolic dysfunctions: Secondary | ICD-10-CM | POA: Diagnosis not present

## 2019-10-07 DIAGNOSIS — M6281 Muscle weakness (generalized): Secondary | ICD-10-CM | POA: Diagnosis not present

## 2019-10-08 DIAGNOSIS — R2681 Unsteadiness on feet: Secondary | ICD-10-CM | POA: Diagnosis not present

## 2019-10-08 DIAGNOSIS — M6281 Muscle weakness (generalized): Secondary | ICD-10-CM | POA: Diagnosis not present

## 2019-10-09 DIAGNOSIS — R488 Other symbolic dysfunctions: Secondary | ICD-10-CM | POA: Diagnosis not present

## 2019-10-09 DIAGNOSIS — R41841 Cognitive communication deficit: Secondary | ICD-10-CM | POA: Diagnosis not present

## 2019-10-09 DIAGNOSIS — M6281 Muscle weakness (generalized): Secondary | ICD-10-CM | POA: Diagnosis not present

## 2019-10-10 DIAGNOSIS — R488 Other symbolic dysfunctions: Secondary | ICD-10-CM | POA: Diagnosis not present

## 2019-10-10 DIAGNOSIS — M6281 Muscle weakness (generalized): Secondary | ICD-10-CM | POA: Diagnosis not present

## 2019-10-11 DIAGNOSIS — R2681 Unsteadiness on feet: Secondary | ICD-10-CM | POA: Diagnosis not present

## 2019-10-11 DIAGNOSIS — M6281 Muscle weakness (generalized): Secondary | ICD-10-CM | POA: Diagnosis not present

## 2019-10-12 DIAGNOSIS — R41841 Cognitive communication deficit: Secondary | ICD-10-CM | POA: Diagnosis not present

## 2019-10-14 DIAGNOSIS — R2681 Unsteadiness on feet: Secondary | ICD-10-CM | POA: Diagnosis not present

## 2019-10-14 DIAGNOSIS — R41841 Cognitive communication deficit: Secondary | ICD-10-CM | POA: Diagnosis not present

## 2019-10-14 DIAGNOSIS — M6281 Muscle weakness (generalized): Secondary | ICD-10-CM | POA: Diagnosis not present

## 2019-10-14 DIAGNOSIS — R488 Other symbolic dysfunctions: Secondary | ICD-10-CM | POA: Diagnosis not present

## 2019-10-15 DIAGNOSIS — R488 Other symbolic dysfunctions: Secondary | ICD-10-CM | POA: Diagnosis not present

## 2019-10-15 DIAGNOSIS — M6281 Muscle weakness (generalized): Secondary | ICD-10-CM | POA: Diagnosis not present

## 2019-10-16 DIAGNOSIS — R488 Other symbolic dysfunctions: Secondary | ICD-10-CM | POA: Diagnosis not present

## 2019-10-16 DIAGNOSIS — M6281 Muscle weakness (generalized): Secondary | ICD-10-CM | POA: Diagnosis not present

## 2019-10-17 DIAGNOSIS — R41841 Cognitive communication deficit: Secondary | ICD-10-CM | POA: Diagnosis not present

## 2019-10-18 DIAGNOSIS — R488 Other symbolic dysfunctions: Secondary | ICD-10-CM | POA: Diagnosis not present

## 2019-10-18 DIAGNOSIS — R41841 Cognitive communication deficit: Secondary | ICD-10-CM | POA: Diagnosis not present

## 2019-10-18 DIAGNOSIS — M6281 Muscle weakness (generalized): Secondary | ICD-10-CM | POA: Diagnosis not present

## 2019-10-21 DIAGNOSIS — M6281 Muscle weakness (generalized): Secondary | ICD-10-CM | POA: Diagnosis not present

## 2019-10-21 DIAGNOSIS — R2681 Unsteadiness on feet: Secondary | ICD-10-CM | POA: Diagnosis not present

## 2019-10-21 DIAGNOSIS — R41841 Cognitive communication deficit: Secondary | ICD-10-CM | POA: Diagnosis not present

## 2019-10-21 DIAGNOSIS — R488 Other symbolic dysfunctions: Secondary | ICD-10-CM | POA: Diagnosis not present

## 2019-10-22 DIAGNOSIS — M6281 Muscle weakness (generalized): Secondary | ICD-10-CM | POA: Diagnosis not present

## 2019-10-22 DIAGNOSIS — R2681 Unsteadiness on feet: Secondary | ICD-10-CM | POA: Diagnosis not present

## 2019-10-24 DIAGNOSIS — R488 Other symbolic dysfunctions: Secondary | ICD-10-CM | POA: Diagnosis not present

## 2019-10-24 DIAGNOSIS — M6281 Muscle weakness (generalized): Secondary | ICD-10-CM | POA: Diagnosis not present

## 2019-10-25 DIAGNOSIS — R41841 Cognitive communication deficit: Secondary | ICD-10-CM | POA: Diagnosis not present

## 2019-10-28 DIAGNOSIS — M6281 Muscle weakness (generalized): Secondary | ICD-10-CM | POA: Diagnosis not present

## 2019-10-28 DIAGNOSIS — R488 Other symbolic dysfunctions: Secondary | ICD-10-CM | POA: Diagnosis not present

## 2019-10-29 DIAGNOSIS — R41841 Cognitive communication deficit: Secondary | ICD-10-CM | POA: Diagnosis not present

## 2019-10-29 DIAGNOSIS — R488 Other symbolic dysfunctions: Secondary | ICD-10-CM | POA: Diagnosis not present

## 2019-10-29 DIAGNOSIS — R2681 Unsteadiness on feet: Secondary | ICD-10-CM | POA: Diagnosis not present

## 2019-10-29 DIAGNOSIS — M6281 Muscle weakness (generalized): Secondary | ICD-10-CM | POA: Diagnosis not present

## 2019-10-31 DIAGNOSIS — R41841 Cognitive communication deficit: Secondary | ICD-10-CM | POA: Diagnosis not present

## 2019-11-01 DIAGNOSIS — R41841 Cognitive communication deficit: Secondary | ICD-10-CM | POA: Diagnosis not present

## 2019-11-01 DIAGNOSIS — R2681 Unsteadiness on feet: Secondary | ICD-10-CM | POA: Diagnosis not present

## 2019-11-01 DIAGNOSIS — M6281 Muscle weakness (generalized): Secondary | ICD-10-CM | POA: Diagnosis not present

## 2019-11-04 DIAGNOSIS — R488 Other symbolic dysfunctions: Secondary | ICD-10-CM | POA: Diagnosis not present

## 2019-11-04 DIAGNOSIS — R41841 Cognitive communication deficit: Secondary | ICD-10-CM | POA: Diagnosis not present

## 2019-11-04 DIAGNOSIS — M6281 Muscle weakness (generalized): Secondary | ICD-10-CM | POA: Diagnosis not present

## 2019-11-05 DIAGNOSIS — M6281 Muscle weakness (generalized): Secondary | ICD-10-CM | POA: Diagnosis not present

## 2019-11-05 DIAGNOSIS — R488 Other symbolic dysfunctions: Secondary | ICD-10-CM | POA: Diagnosis not present

## 2019-11-06 DIAGNOSIS — R2681 Unsteadiness on feet: Secondary | ICD-10-CM | POA: Diagnosis not present

## 2019-11-06 DIAGNOSIS — M6281 Muscle weakness (generalized): Secondary | ICD-10-CM | POA: Diagnosis not present

## 2019-11-07 DIAGNOSIS — M6281 Muscle weakness (generalized): Secondary | ICD-10-CM | POA: Diagnosis not present

## 2019-11-07 DIAGNOSIS — R488 Other symbolic dysfunctions: Secondary | ICD-10-CM | POA: Diagnosis not present

## 2019-11-07 DIAGNOSIS — R41841 Cognitive communication deficit: Secondary | ICD-10-CM | POA: Diagnosis not present

## 2019-11-07 DIAGNOSIS — R2681 Unsteadiness on feet: Secondary | ICD-10-CM | POA: Diagnosis not present

## 2019-11-08 DIAGNOSIS — M6281 Muscle weakness (generalized): Secondary | ICD-10-CM | POA: Diagnosis not present

## 2019-11-08 DIAGNOSIS — R41841 Cognitive communication deficit: Secondary | ICD-10-CM | POA: Diagnosis not present

## 2019-11-08 DIAGNOSIS — R488 Other symbolic dysfunctions: Secondary | ICD-10-CM | POA: Diagnosis not present

## 2019-11-11 DIAGNOSIS — M6281 Muscle weakness (generalized): Secondary | ICD-10-CM | POA: Diagnosis not present

## 2019-11-11 DIAGNOSIS — R488 Other symbolic dysfunctions: Secondary | ICD-10-CM | POA: Diagnosis not present

## 2019-11-12 DIAGNOSIS — M6281 Muscle weakness (generalized): Secondary | ICD-10-CM | POA: Diagnosis not present

## 2019-11-12 DIAGNOSIS — R41841 Cognitive communication deficit: Secondary | ICD-10-CM | POA: Diagnosis not present

## 2019-11-12 DIAGNOSIS — R488 Other symbolic dysfunctions: Secondary | ICD-10-CM | POA: Diagnosis not present

## 2019-11-13 DIAGNOSIS — M6281 Muscle weakness (generalized): Secondary | ICD-10-CM | POA: Diagnosis not present

## 2019-11-13 DIAGNOSIS — R2681 Unsteadiness on feet: Secondary | ICD-10-CM | POA: Diagnosis not present

## 2019-11-13 DIAGNOSIS — R41841 Cognitive communication deficit: Secondary | ICD-10-CM | POA: Diagnosis not present

## 2019-11-14 DIAGNOSIS — M6281 Muscle weakness (generalized): Secondary | ICD-10-CM | POA: Diagnosis not present

## 2019-11-14 DIAGNOSIS — R41841 Cognitive communication deficit: Secondary | ICD-10-CM | POA: Diagnosis not present

## 2019-11-14 DIAGNOSIS — R2681 Unsteadiness on feet: Secondary | ICD-10-CM | POA: Diagnosis not present

## 2019-11-15 DIAGNOSIS — R2681 Unsteadiness on feet: Secondary | ICD-10-CM | POA: Diagnosis not present

## 2019-11-15 DIAGNOSIS — M6281 Muscle weakness (generalized): Secondary | ICD-10-CM | POA: Diagnosis not present

## 2019-11-18 DIAGNOSIS — R41841 Cognitive communication deficit: Secondary | ICD-10-CM | POA: Diagnosis not present

## 2019-11-18 DIAGNOSIS — M6281 Muscle weakness (generalized): Secondary | ICD-10-CM | POA: Diagnosis not present

## 2019-11-18 DIAGNOSIS — R2681 Unsteadiness on feet: Secondary | ICD-10-CM | POA: Diagnosis not present

## 2019-11-20 DIAGNOSIS — R41841 Cognitive communication deficit: Secondary | ICD-10-CM | POA: Diagnosis not present

## 2019-11-22 DIAGNOSIS — R41841 Cognitive communication deficit: Secondary | ICD-10-CM | POA: Diagnosis not present

## 2019-11-25 DIAGNOSIS — R41841 Cognitive communication deficit: Secondary | ICD-10-CM | POA: Diagnosis not present

## 2019-11-26 DIAGNOSIS — R41841 Cognitive communication deficit: Secondary | ICD-10-CM | POA: Diagnosis not present

## 2019-11-27 DIAGNOSIS — Z20828 Contact with and (suspected) exposure to other viral communicable diseases: Secondary | ICD-10-CM | POA: Diagnosis not present

## 2019-11-27 DIAGNOSIS — Z1159 Encounter for screening for other viral diseases: Secondary | ICD-10-CM | POA: Diagnosis not present

## 2019-11-29 DIAGNOSIS — R41841 Cognitive communication deficit: Secondary | ICD-10-CM | POA: Diagnosis not present

## 2019-12-03 DIAGNOSIS — R41841 Cognitive communication deficit: Secondary | ICD-10-CM | POA: Diagnosis not present

## 2019-12-04 DIAGNOSIS — R41841 Cognitive communication deficit: Secondary | ICD-10-CM | POA: Diagnosis not present

## 2019-12-10 DIAGNOSIS — R41841 Cognitive communication deficit: Secondary | ICD-10-CM | POA: Diagnosis not present

## 2019-12-11 DIAGNOSIS — R41841 Cognitive communication deficit: Secondary | ICD-10-CM | POA: Diagnosis not present

## 2019-12-12 DIAGNOSIS — R41841 Cognitive communication deficit: Secondary | ICD-10-CM | POA: Diagnosis not present

## 2019-12-16 DIAGNOSIS — R41841 Cognitive communication deficit: Secondary | ICD-10-CM | POA: Diagnosis not present

## 2019-12-23 DIAGNOSIS — R41841 Cognitive communication deficit: Secondary | ICD-10-CM | POA: Diagnosis not present

## 2020-01-01 DIAGNOSIS — Z1159 Encounter for screening for other viral diseases: Secondary | ICD-10-CM | POA: Diagnosis not present

## 2020-01-01 DIAGNOSIS — Z20828 Contact with and (suspected) exposure to other viral communicable diseases: Secondary | ICD-10-CM | POA: Diagnosis not present

## 2020-01-08 DIAGNOSIS — Z20828 Contact with and (suspected) exposure to other viral communicable diseases: Secondary | ICD-10-CM | POA: Diagnosis not present

## 2020-01-08 DIAGNOSIS — Z1159 Encounter for screening for other viral diseases: Secondary | ICD-10-CM | POA: Diagnosis not present

## 2020-01-15 DIAGNOSIS — Z20828 Contact with and (suspected) exposure to other viral communicable diseases: Secondary | ICD-10-CM | POA: Diagnosis not present

## 2020-01-15 DIAGNOSIS — Z1159 Encounter for screening for other viral diseases: Secondary | ICD-10-CM | POA: Diagnosis not present

## 2020-01-22 DIAGNOSIS — Z1159 Encounter for screening for other viral diseases: Secondary | ICD-10-CM | POA: Diagnosis not present

## 2020-01-22 DIAGNOSIS — Z20828 Contact with and (suspected) exposure to other viral communicable diseases: Secondary | ICD-10-CM | POA: Diagnosis not present

## 2020-01-23 DIAGNOSIS — H04123 Dry eye syndrome of bilateral lacrimal glands: Secondary | ICD-10-CM | POA: Diagnosis not present

## 2020-01-23 DIAGNOSIS — Z961 Presence of intraocular lens: Secondary | ICD-10-CM | POA: Diagnosis not present

## 2020-01-23 DIAGNOSIS — H1045 Other chronic allergic conjunctivitis: Secondary | ICD-10-CM | POA: Diagnosis not present

## 2020-01-24 ENCOUNTER — Other Ambulatory Visit: Payer: Self-pay | Admitting: Internal Medicine

## 2020-01-24 DIAGNOSIS — Z23 Encounter for immunization: Secondary | ICD-10-CM | POA: Diagnosis not present

## 2020-01-24 DIAGNOSIS — H6123 Impacted cerumen, bilateral: Secondary | ICD-10-CM | POA: Diagnosis not present

## 2020-01-24 DIAGNOSIS — M810 Age-related osteoporosis without current pathological fracture: Secondary | ICD-10-CM

## 2020-01-24 DIAGNOSIS — E039 Hypothyroidism, unspecified: Secondary | ICD-10-CM | POA: Diagnosis not present

## 2020-01-24 DIAGNOSIS — Z1389 Encounter for screening for other disorder: Secondary | ICD-10-CM | POA: Diagnosis not present

## 2020-01-24 DIAGNOSIS — Z1231 Encounter for screening mammogram for malignant neoplasm of breast: Secondary | ICD-10-CM

## 2020-01-24 DIAGNOSIS — Z0000 Encounter for general adult medical examination without abnormal findings: Secondary | ICD-10-CM | POA: Diagnosis not present

## 2020-01-24 DIAGNOSIS — I10 Essential (primary) hypertension: Secondary | ICD-10-CM | POA: Diagnosis not present

## 2020-01-29 DIAGNOSIS — Z1159 Encounter for screening for other viral diseases: Secondary | ICD-10-CM | POA: Diagnosis not present

## 2020-01-29 DIAGNOSIS — Z20828 Contact with and (suspected) exposure to other viral communicable diseases: Secondary | ICD-10-CM | POA: Diagnosis not present

## 2020-02-05 DIAGNOSIS — Z1159 Encounter for screening for other viral diseases: Secondary | ICD-10-CM | POA: Diagnosis not present

## 2020-02-05 DIAGNOSIS — Z20828 Contact with and (suspected) exposure to other viral communicable diseases: Secondary | ICD-10-CM | POA: Diagnosis not present

## 2020-02-10 IMAGING — DX DG TIBIA/FIBULA 2V*R*
4 series · 4 of 4 positions shown · non-contrast
Comparison: None.

CLINICAL DATA: Cat scratch that has become infected/swollen.
anteriolateral near mid shaft

EXAM:
RIGHT TIBIA AND FIBULA - 2 VIEW

[tibia ap (1 of 2)]
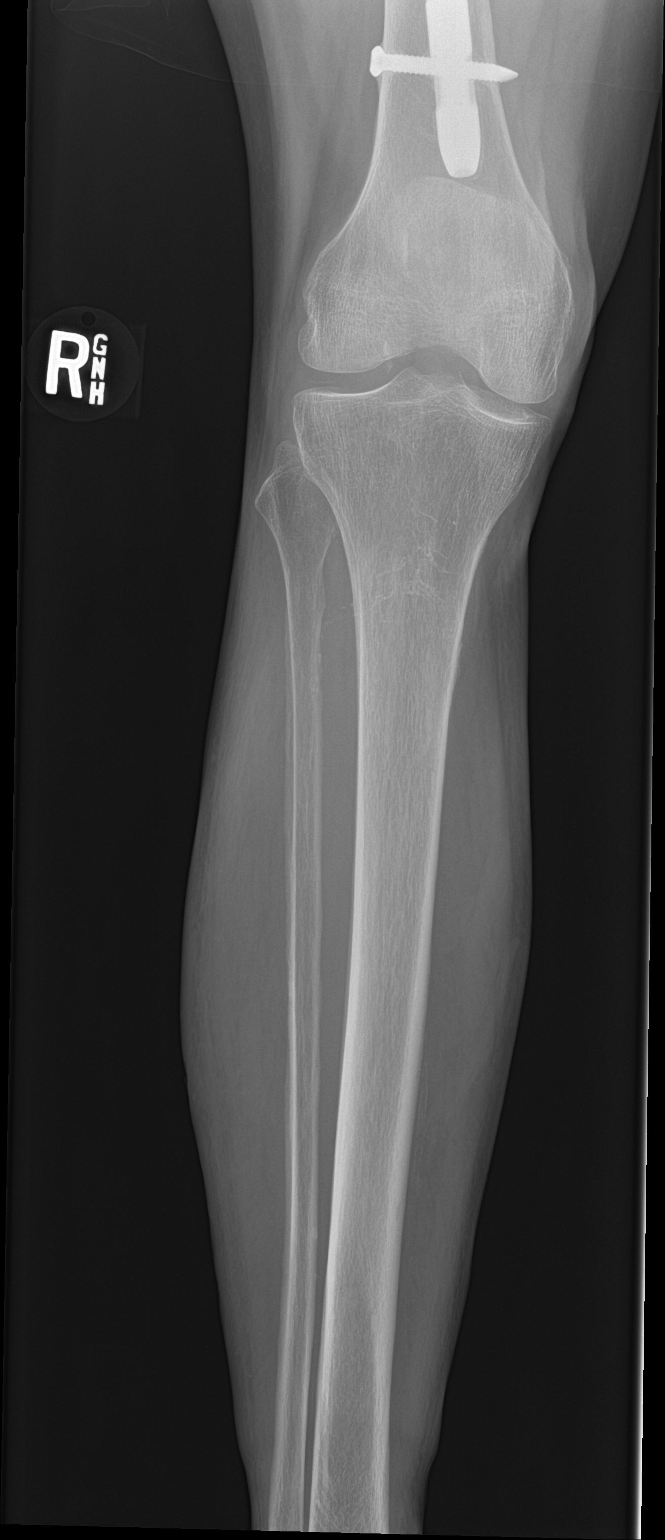

[tibia ap (2 of 2)]
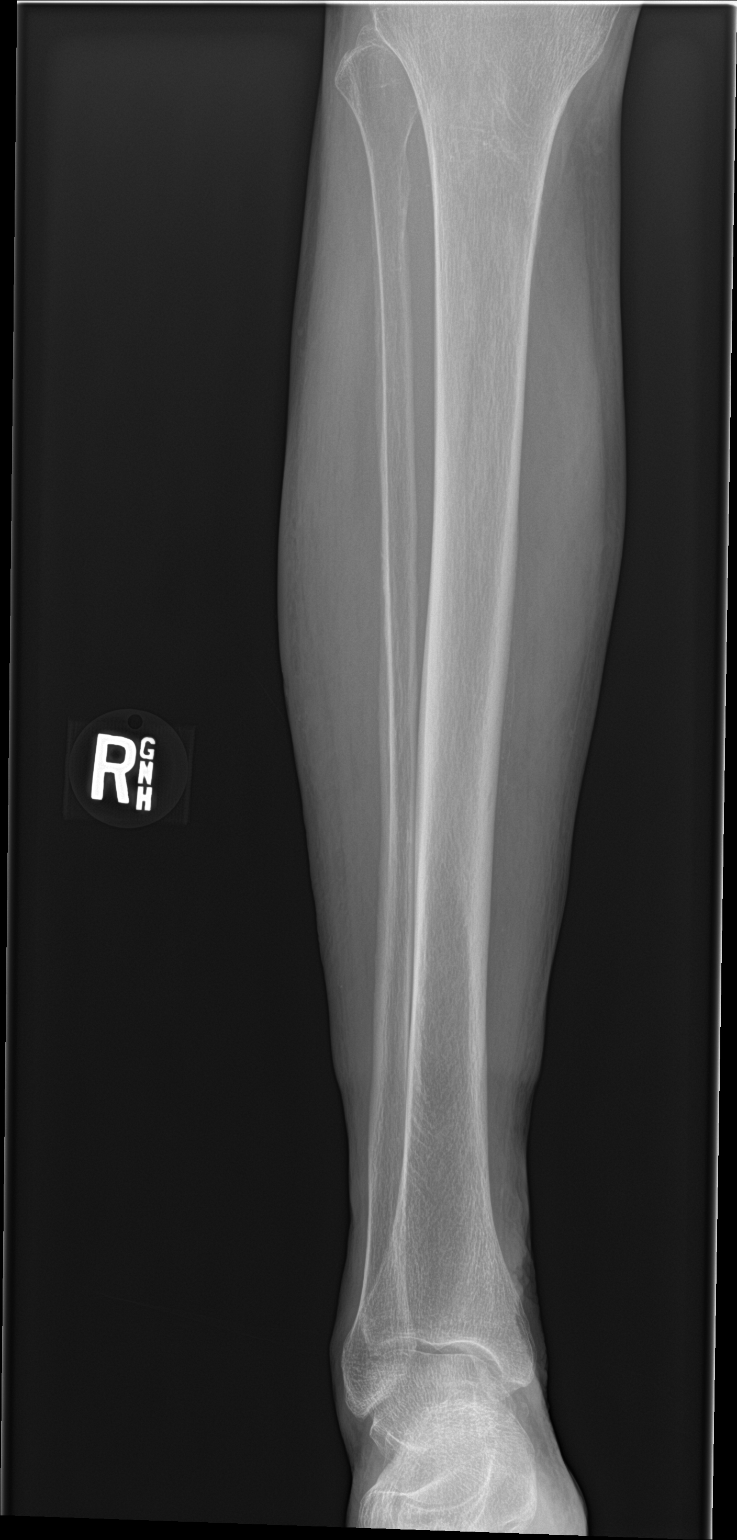

[tibia lat (1 of 2)]
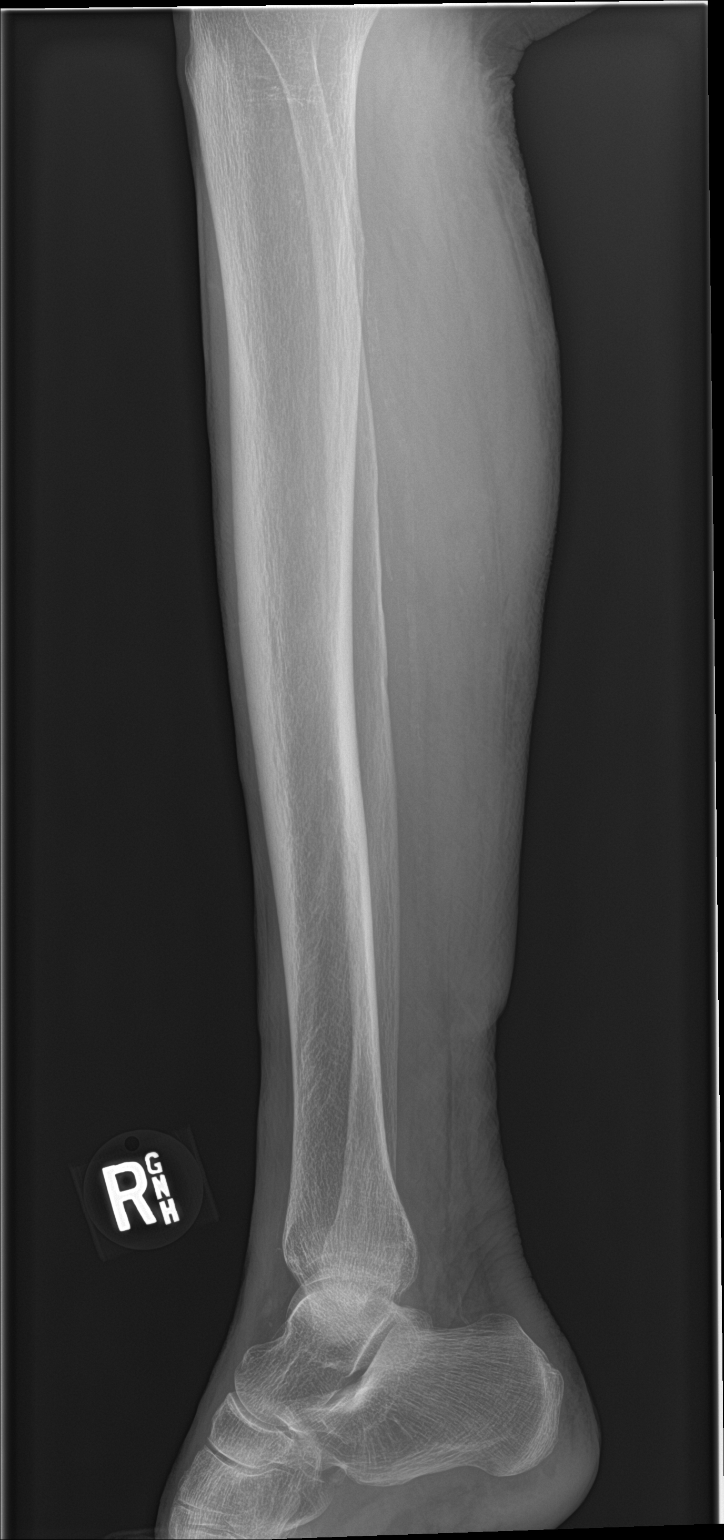

[tibia lat (2 of 2)]
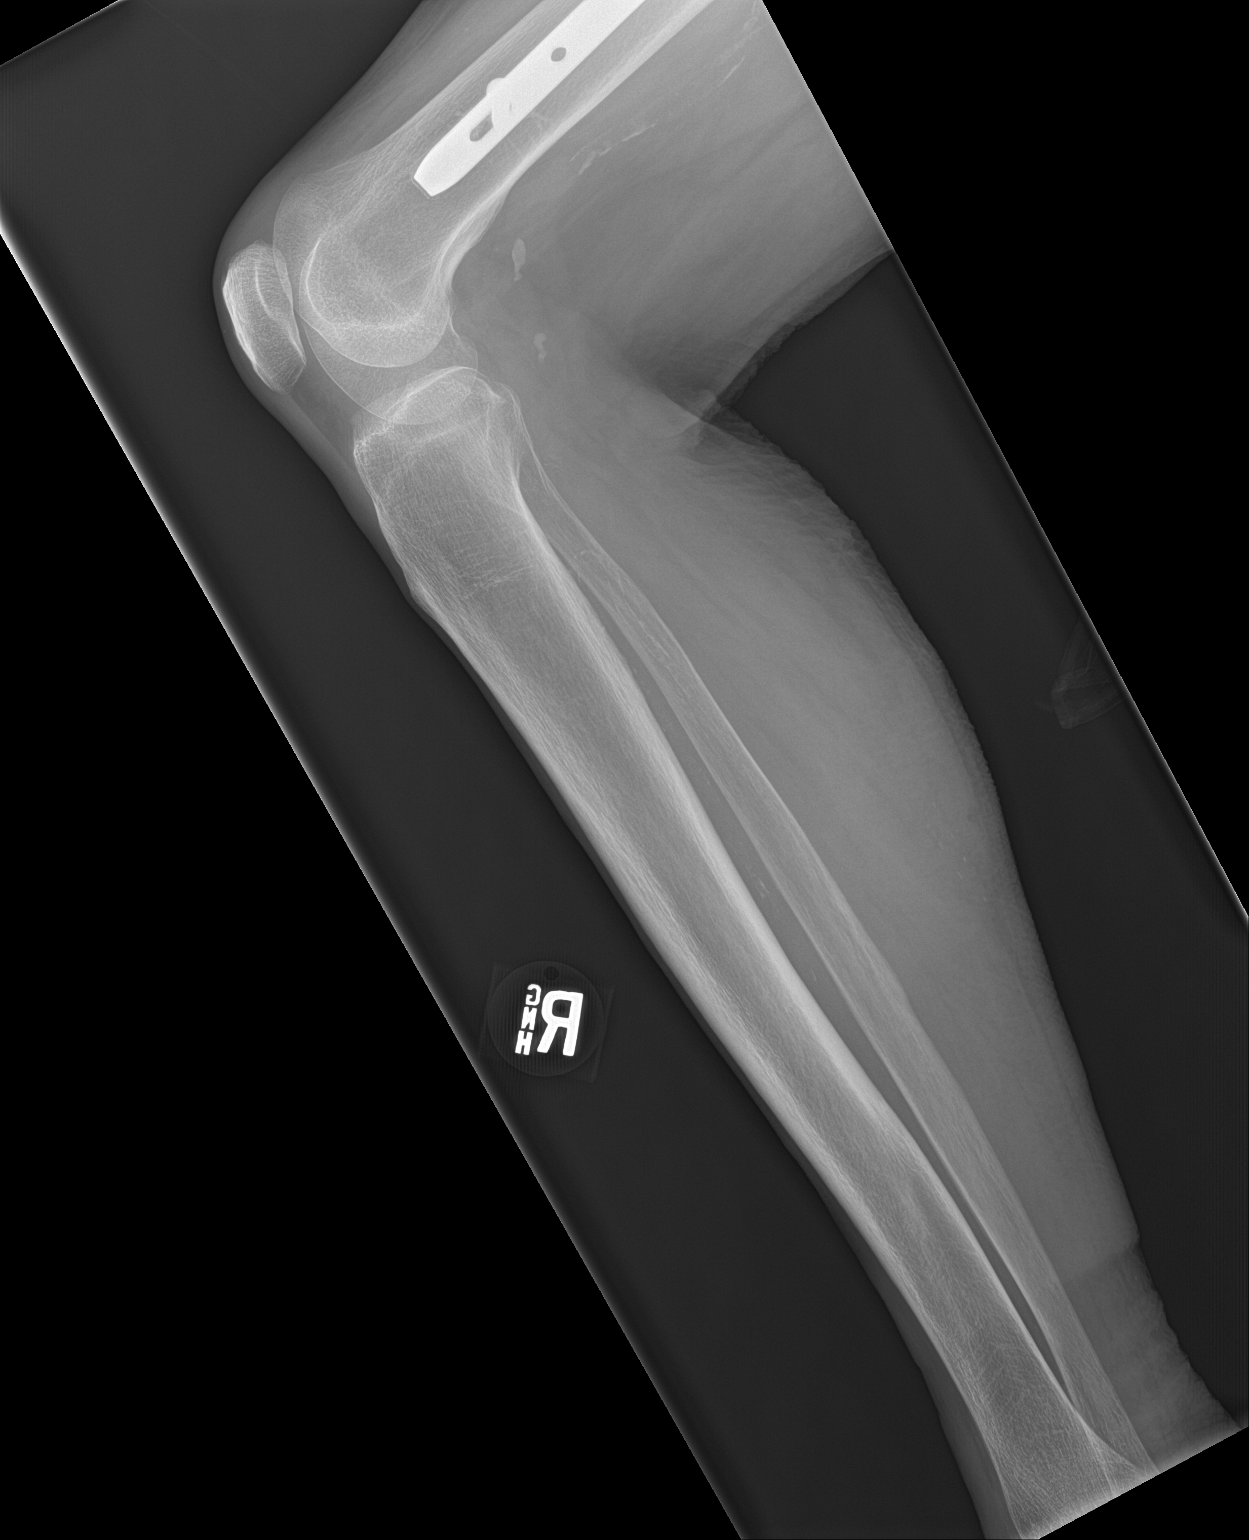

[4 of 4 positions shown; findings below may reference images not displayed]

FINDINGS: No fracture. No bone lesion. No evidence of osteomyelitis. Bones are
demineralized.

Knee and ankle joints are normally spaced and aligned.

There is subcutaneous soft tissue edema.  No soft tissue air.
IMPRESSION: 1. No fracture or bone lesion.  No evidence of osteomyelitis.
2. Soft tissue edema and/or cellulitis.  No soft tissue air.

## 2020-02-12 DIAGNOSIS — Z20828 Contact with and (suspected) exposure to other viral communicable diseases: Secondary | ICD-10-CM | POA: Diagnosis not present

## 2020-02-12 DIAGNOSIS — Z1159 Encounter for screening for other viral diseases: Secondary | ICD-10-CM | POA: Diagnosis not present

## 2020-02-19 DIAGNOSIS — Z20828 Contact with and (suspected) exposure to other viral communicable diseases: Secondary | ICD-10-CM | POA: Diagnosis not present

## 2020-02-19 DIAGNOSIS — Z1159 Encounter for screening for other viral diseases: Secondary | ICD-10-CM | POA: Diagnosis not present

## 2020-02-26 DIAGNOSIS — Z1159 Encounter for screening for other viral diseases: Secondary | ICD-10-CM | POA: Diagnosis not present

## 2020-02-26 DIAGNOSIS — Z20828 Contact with and (suspected) exposure to other viral communicable diseases: Secondary | ICD-10-CM | POA: Diagnosis not present

## 2020-03-11 DIAGNOSIS — Z20828 Contact with and (suspected) exposure to other viral communicable diseases: Secondary | ICD-10-CM | POA: Diagnosis not present

## 2020-03-11 DIAGNOSIS — Z1159 Encounter for screening for other viral diseases: Secondary | ICD-10-CM | POA: Diagnosis not present

## 2020-03-18 DIAGNOSIS — Z20828 Contact with and (suspected) exposure to other viral communicable diseases: Secondary | ICD-10-CM | POA: Diagnosis not present

## 2020-03-18 DIAGNOSIS — Z1159 Encounter for screening for other viral diseases: Secondary | ICD-10-CM | POA: Diagnosis not present

## 2020-03-25 DIAGNOSIS — Z20828 Contact with and (suspected) exposure to other viral communicable diseases: Secondary | ICD-10-CM | POA: Diagnosis not present

## 2020-03-25 DIAGNOSIS — Z1159 Encounter for screening for other viral diseases: Secondary | ICD-10-CM | POA: Diagnosis not present

## 2020-04-01 DIAGNOSIS — Z20828 Contact with and (suspected) exposure to other viral communicable diseases: Secondary | ICD-10-CM | POA: Diagnosis not present

## 2020-04-01 DIAGNOSIS — Z1159 Encounter for screening for other viral diseases: Secondary | ICD-10-CM | POA: Diagnosis not present

## 2020-04-08 DIAGNOSIS — Z20828 Contact with and (suspected) exposure to other viral communicable diseases: Secondary | ICD-10-CM | POA: Diagnosis not present

## 2020-04-08 DIAGNOSIS — Z1159 Encounter for screening for other viral diseases: Secondary | ICD-10-CM | POA: Diagnosis not present

## 2020-04-15 DIAGNOSIS — Z1159 Encounter for screening for other viral diseases: Secondary | ICD-10-CM | POA: Diagnosis not present

## 2020-04-15 DIAGNOSIS — Z20828 Contact with and (suspected) exposure to other viral communicable diseases: Secondary | ICD-10-CM | POA: Diagnosis not present

## 2020-04-20 ENCOUNTER — Ambulatory Visit
Admission: RE | Admit: 2020-04-20 | Discharge: 2020-04-20 | Disposition: A | Discharge status: Home / Self Care | Payer: Medicare Other | Source: Ambulatory Visit | Attending: Internal Medicine | Admitting: Internal Medicine

## 2020-04-20 ENCOUNTER — Other Ambulatory Visit: Payer: Self-pay

## 2020-04-20 DIAGNOSIS — Z1231 Encounter for screening mammogram for malignant neoplasm of breast: Secondary | ICD-10-CM

## 2020-04-20 DIAGNOSIS — M810 Age-related osteoporosis without current pathological fracture: Secondary | ICD-10-CM

## 2020-04-20 DIAGNOSIS — Z23 Encounter for immunization: Secondary | ICD-10-CM | POA: Diagnosis not present

## 2020-04-20 DIAGNOSIS — Z780 Asymptomatic menopausal state: Secondary | ICD-10-CM | POA: Diagnosis not present

## 2020-04-20 DIAGNOSIS — E039 Hypothyroidism, unspecified: Secondary | ICD-10-CM | POA: Diagnosis not present

## 2020-04-20 DIAGNOSIS — R4189 Other symptoms and signs involving cognitive functions and awareness: Secondary | ICD-10-CM | POA: Diagnosis not present

## 2020-04-20 DIAGNOSIS — I10 Essential (primary) hypertension: Secondary | ICD-10-CM | POA: Diagnosis not present

## 2020-04-21 DIAGNOSIS — R488 Other symbolic dysfunctions: Secondary | ICD-10-CM | POA: Diagnosis not present

## 2020-04-21 DIAGNOSIS — Z20828 Contact with and (suspected) exposure to other viral communicable diseases: Secondary | ICD-10-CM | POA: Diagnosis not present

## 2020-04-21 DIAGNOSIS — Z1159 Encounter for screening for other viral diseases: Secondary | ICD-10-CM | POA: Diagnosis not present

## 2020-04-23 DIAGNOSIS — R488 Other symbolic dysfunctions: Secondary | ICD-10-CM | POA: Diagnosis not present

## 2020-04-24 DIAGNOSIS — Z1159 Encounter for screening for other viral diseases: Secondary | ICD-10-CM | POA: Diagnosis not present

## 2020-04-24 DIAGNOSIS — Z20828 Contact with and (suspected) exposure to other viral communicable diseases: Secondary | ICD-10-CM | POA: Diagnosis not present

## 2020-04-24 DIAGNOSIS — R488 Other symbolic dysfunctions: Secondary | ICD-10-CM | POA: Diagnosis not present

## 2020-04-27 DIAGNOSIS — R488 Other symbolic dysfunctions: Secondary | ICD-10-CM | POA: Diagnosis not present

## 2020-04-28 DIAGNOSIS — R41841 Cognitive communication deficit: Secondary | ICD-10-CM | POA: Diagnosis not present

## 2020-04-28 DIAGNOSIS — Z1159 Encounter for screening for other viral diseases: Secondary | ICD-10-CM | POA: Diagnosis not present

## 2020-04-28 DIAGNOSIS — R488 Other symbolic dysfunctions: Secondary | ICD-10-CM | POA: Diagnosis not present

## 2020-04-28 DIAGNOSIS — Z20828 Contact with and (suspected) exposure to other viral communicable diseases: Secondary | ICD-10-CM | POA: Diagnosis not present

## 2020-04-29 DIAGNOSIS — R488 Other symbolic dysfunctions: Secondary | ICD-10-CM | POA: Diagnosis not present

## 2020-04-29 DIAGNOSIS — R41841 Cognitive communication deficit: Secondary | ICD-10-CM | POA: Diagnosis not present

## 2020-04-30 DIAGNOSIS — R488 Other symbolic dysfunctions: Secondary | ICD-10-CM | POA: Diagnosis not present

## 2020-04-30 DIAGNOSIS — R41841 Cognitive communication deficit: Secondary | ICD-10-CM | POA: Diagnosis not present

## 2020-05-01 DIAGNOSIS — Z1159 Encounter for screening for other viral diseases: Secondary | ICD-10-CM | POA: Diagnosis not present

## 2020-05-01 DIAGNOSIS — Z20828 Contact with and (suspected) exposure to other viral communicable diseases: Secondary | ICD-10-CM | POA: Diagnosis not present

## 2020-05-04 DIAGNOSIS — R488 Other symbolic dysfunctions: Secondary | ICD-10-CM | POA: Diagnosis not present

## 2020-05-05 DIAGNOSIS — R488 Other symbolic dysfunctions: Secondary | ICD-10-CM | POA: Diagnosis not present

## 2020-05-07 DIAGNOSIS — R488 Other symbolic dysfunctions: Secondary | ICD-10-CM | POA: Diagnosis not present

## 2020-05-08 DIAGNOSIS — Z1159 Encounter for screening for other viral diseases: Secondary | ICD-10-CM | POA: Diagnosis not present

## 2020-05-08 DIAGNOSIS — R488 Other symbolic dysfunctions: Secondary | ICD-10-CM | POA: Diagnosis not present

## 2020-05-08 DIAGNOSIS — Z20828 Contact with and (suspected) exposure to other viral communicable diseases: Secondary | ICD-10-CM | POA: Diagnosis not present

## 2020-05-12 DIAGNOSIS — R488 Other symbolic dysfunctions: Secondary | ICD-10-CM | POA: Diagnosis not present

## 2020-05-12 DIAGNOSIS — Z1159 Encounter for screening for other viral diseases: Secondary | ICD-10-CM | POA: Diagnosis not present

## 2020-05-12 DIAGNOSIS — Z20828 Contact with and (suspected) exposure to other viral communicable diseases: Secondary | ICD-10-CM | POA: Diagnosis not present

## 2020-05-12 DIAGNOSIS — R41841 Cognitive communication deficit: Secondary | ICD-10-CM | POA: Diagnosis not present

## 2020-05-13 DIAGNOSIS — R488 Other symbolic dysfunctions: Secondary | ICD-10-CM | POA: Diagnosis not present

## 2020-05-14 DIAGNOSIS — R488 Other symbolic dysfunctions: Secondary | ICD-10-CM | POA: Diagnosis not present

## 2020-05-15 DIAGNOSIS — R488 Other symbolic dysfunctions: Secondary | ICD-10-CM | POA: Diagnosis not present

## 2020-05-15 DIAGNOSIS — Z20828 Contact with and (suspected) exposure to other viral communicable diseases: Secondary | ICD-10-CM | POA: Diagnosis not present

## 2020-05-15 DIAGNOSIS — Z1159 Encounter for screening for other viral diseases: Secondary | ICD-10-CM | POA: Diagnosis not present

## 2020-05-18 DIAGNOSIS — R41841 Cognitive communication deficit: Secondary | ICD-10-CM | POA: Diagnosis not present

## 2020-05-18 DIAGNOSIS — R488 Other symbolic dysfunctions: Secondary | ICD-10-CM | POA: Diagnosis not present

## 2020-05-19 DIAGNOSIS — Z1159 Encounter for screening for other viral diseases: Secondary | ICD-10-CM | POA: Diagnosis not present

## 2020-05-19 DIAGNOSIS — Z20828 Contact with and (suspected) exposure to other viral communicable diseases: Secondary | ICD-10-CM | POA: Diagnosis not present

## 2020-05-19 DIAGNOSIS — R488 Other symbolic dysfunctions: Secondary | ICD-10-CM | POA: Diagnosis not present

## 2020-05-20 DIAGNOSIS — R488 Other symbolic dysfunctions: Secondary | ICD-10-CM | POA: Diagnosis not present

## 2020-05-20 DIAGNOSIS — R41841 Cognitive communication deficit: Secondary | ICD-10-CM | POA: Diagnosis not present

## 2020-05-21 DIAGNOSIS — R488 Other symbolic dysfunctions: Secondary | ICD-10-CM | POA: Diagnosis not present

## 2020-05-21 DIAGNOSIS — R41841 Cognitive communication deficit: Secondary | ICD-10-CM | POA: Diagnosis not present

## 2020-05-22 DIAGNOSIS — Z1159 Encounter for screening for other viral diseases: Secondary | ICD-10-CM | POA: Diagnosis not present

## 2020-05-22 DIAGNOSIS — Z20828 Contact with and (suspected) exposure to other viral communicable diseases: Secondary | ICD-10-CM | POA: Diagnosis not present

## 2020-05-25 DIAGNOSIS — R41841 Cognitive communication deficit: Secondary | ICD-10-CM | POA: Diagnosis not present

## 2020-05-25 DIAGNOSIS — R488 Other symbolic dysfunctions: Secondary | ICD-10-CM | POA: Diagnosis not present

## 2020-05-26 DIAGNOSIS — Z1159 Encounter for screening for other viral diseases: Secondary | ICD-10-CM | POA: Diagnosis not present

## 2020-05-26 DIAGNOSIS — R41841 Cognitive communication deficit: Secondary | ICD-10-CM | POA: Diagnosis not present

## 2020-05-26 DIAGNOSIS — Z20828 Contact with and (suspected) exposure to other viral communicable diseases: Secondary | ICD-10-CM | POA: Diagnosis not present

## 2020-05-26 DIAGNOSIS — R488 Other symbolic dysfunctions: Secondary | ICD-10-CM | POA: Diagnosis not present

## 2020-05-27 DIAGNOSIS — R488 Other symbolic dysfunctions: Secondary | ICD-10-CM | POA: Diagnosis not present

## 2020-05-27 DIAGNOSIS — R41841 Cognitive communication deficit: Secondary | ICD-10-CM | POA: Diagnosis not present

## 2020-05-28 DIAGNOSIS — R488 Other symbolic dysfunctions: Secondary | ICD-10-CM | POA: Diagnosis not present

## 2020-05-29 DIAGNOSIS — R488 Other symbolic dysfunctions: Secondary | ICD-10-CM | POA: Diagnosis not present

## 2020-06-01 DIAGNOSIS — R488 Other symbolic dysfunctions: Secondary | ICD-10-CM | POA: Diagnosis not present

## 2020-06-01 DIAGNOSIS — R41841 Cognitive communication deficit: Secondary | ICD-10-CM | POA: Diagnosis not present

## 2020-06-02 DIAGNOSIS — Z20828 Contact with and (suspected) exposure to other viral communicable diseases: Secondary | ICD-10-CM | POA: Diagnosis not present

## 2020-06-02 DIAGNOSIS — Z1159 Encounter for screening for other viral diseases: Secondary | ICD-10-CM | POA: Diagnosis not present

## 2020-06-02 DIAGNOSIS — R488 Other symbolic dysfunctions: Secondary | ICD-10-CM | POA: Diagnosis not present

## 2020-06-03 DIAGNOSIS — R488 Other symbolic dysfunctions: Secondary | ICD-10-CM | POA: Diagnosis not present

## 2020-06-03 DIAGNOSIS — R41841 Cognitive communication deficit: Secondary | ICD-10-CM | POA: Diagnosis not present

## 2020-06-04 DIAGNOSIS — R488 Other symbolic dysfunctions: Secondary | ICD-10-CM | POA: Diagnosis not present

## 2020-06-05 DIAGNOSIS — R41841 Cognitive communication deficit: Secondary | ICD-10-CM | POA: Diagnosis not present

## 2020-06-05 DIAGNOSIS — Z20828 Contact with and (suspected) exposure to other viral communicable diseases: Secondary | ICD-10-CM | POA: Diagnosis not present

## 2020-06-05 DIAGNOSIS — R488 Other symbolic dysfunctions: Secondary | ICD-10-CM | POA: Diagnosis not present

## 2020-06-05 DIAGNOSIS — Z1159 Encounter for screening for other viral diseases: Secondary | ICD-10-CM | POA: Diagnosis not present

## 2020-06-08 DIAGNOSIS — R41841 Cognitive communication deficit: Secondary | ICD-10-CM | POA: Diagnosis not present

## 2020-06-08 DIAGNOSIS — R488 Other symbolic dysfunctions: Secondary | ICD-10-CM | POA: Diagnosis not present

## 2020-06-09 DIAGNOSIS — Z1159 Encounter for screening for other viral diseases: Secondary | ICD-10-CM | POA: Diagnosis not present

## 2020-06-09 DIAGNOSIS — R41841 Cognitive communication deficit: Secondary | ICD-10-CM | POA: Diagnosis not present

## 2020-06-09 DIAGNOSIS — Z20828 Contact with and (suspected) exposure to other viral communicable diseases: Secondary | ICD-10-CM | POA: Diagnosis not present

## 2020-06-09 DIAGNOSIS — R488 Other symbolic dysfunctions: Secondary | ICD-10-CM | POA: Diagnosis not present

## 2020-06-10 DIAGNOSIS — R488 Other symbolic dysfunctions: Secondary | ICD-10-CM | POA: Diagnosis not present

## 2020-06-10 DIAGNOSIS — R41841 Cognitive communication deficit: Secondary | ICD-10-CM | POA: Diagnosis not present

## 2020-06-11 DIAGNOSIS — R488 Other symbolic dysfunctions: Secondary | ICD-10-CM | POA: Diagnosis not present

## 2020-06-12 DIAGNOSIS — Z1159 Encounter for screening for other viral diseases: Secondary | ICD-10-CM | POA: Diagnosis not present

## 2020-06-12 DIAGNOSIS — Z20828 Contact with and (suspected) exposure to other viral communicable diseases: Secondary | ICD-10-CM | POA: Diagnosis not present

## 2020-06-15 DIAGNOSIS — R488 Other symbolic dysfunctions: Secondary | ICD-10-CM | POA: Diagnosis not present

## 2020-06-15 DIAGNOSIS — R41841 Cognitive communication deficit: Secondary | ICD-10-CM | POA: Diagnosis not present

## 2020-06-16 DIAGNOSIS — R41841 Cognitive communication deficit: Secondary | ICD-10-CM | POA: Diagnosis not present

## 2020-06-16 DIAGNOSIS — Z1159 Encounter for screening for other viral diseases: Secondary | ICD-10-CM | POA: Diagnosis not present

## 2020-06-16 DIAGNOSIS — R488 Other symbolic dysfunctions: Secondary | ICD-10-CM | POA: Diagnosis not present

## 2020-06-16 DIAGNOSIS — Z20828 Contact with and (suspected) exposure to other viral communicable diseases: Secondary | ICD-10-CM | POA: Diagnosis not present

## 2020-06-17 DIAGNOSIS — R488 Other symbolic dysfunctions: Secondary | ICD-10-CM | POA: Diagnosis not present

## 2020-06-18 DIAGNOSIS — R488 Other symbolic dysfunctions: Secondary | ICD-10-CM | POA: Diagnosis not present

## 2020-06-19 DIAGNOSIS — Z20828 Contact with and (suspected) exposure to other viral communicable diseases: Secondary | ICD-10-CM | POA: Diagnosis not present

## 2020-06-19 DIAGNOSIS — R488 Other symbolic dysfunctions: Secondary | ICD-10-CM | POA: Diagnosis not present

## 2020-06-19 DIAGNOSIS — R41841 Cognitive communication deficit: Secondary | ICD-10-CM | POA: Diagnosis not present

## 2020-06-19 DIAGNOSIS — Z1159 Encounter for screening for other viral diseases: Secondary | ICD-10-CM | POA: Diagnosis not present

## 2020-06-22 DIAGNOSIS — R488 Other symbolic dysfunctions: Secondary | ICD-10-CM | POA: Diagnosis not present

## 2020-06-22 DIAGNOSIS — R41841 Cognitive communication deficit: Secondary | ICD-10-CM | POA: Diagnosis not present

## 2020-06-23 DIAGNOSIS — Z20828 Contact with and (suspected) exposure to other viral communicable diseases: Secondary | ICD-10-CM | POA: Diagnosis not present

## 2020-06-23 DIAGNOSIS — R488 Other symbolic dysfunctions: Secondary | ICD-10-CM | POA: Diagnosis not present

## 2020-06-23 DIAGNOSIS — Z1159 Encounter for screening for other viral diseases: Secondary | ICD-10-CM | POA: Diagnosis not present

## 2020-06-23 DIAGNOSIS — R41841 Cognitive communication deficit: Secondary | ICD-10-CM | POA: Diagnosis not present

## 2020-06-24 DIAGNOSIS — R488 Other symbolic dysfunctions: Secondary | ICD-10-CM | POA: Diagnosis not present

## 2020-06-26 DIAGNOSIS — R41841 Cognitive communication deficit: Secondary | ICD-10-CM | POA: Diagnosis not present

## 2020-06-26 DIAGNOSIS — R488 Other symbolic dysfunctions: Secondary | ICD-10-CM | POA: Diagnosis not present

## 2020-06-29 DIAGNOSIS — R41841 Cognitive communication deficit: Secondary | ICD-10-CM | POA: Diagnosis not present

## 2020-06-29 DIAGNOSIS — R488 Other symbolic dysfunctions: Secondary | ICD-10-CM | POA: Diagnosis not present

## 2020-06-30 DIAGNOSIS — Z1159 Encounter for screening for other viral diseases: Secondary | ICD-10-CM | POA: Diagnosis not present

## 2020-06-30 DIAGNOSIS — Z20828 Contact with and (suspected) exposure to other viral communicable diseases: Secondary | ICD-10-CM | POA: Diagnosis not present

## 2020-06-30 DIAGNOSIS — R3915 Urgency of urination: Secondary | ICD-10-CM | POA: Diagnosis not present

## 2020-06-30 DIAGNOSIS — I10 Essential (primary) hypertension: Secondary | ICD-10-CM | POA: Diagnosis not present

## 2020-06-30 DIAGNOSIS — R410 Disorientation, unspecified: Secondary | ICD-10-CM | POA: Diagnosis not present

## 2020-07-01 DIAGNOSIS — R488 Other symbolic dysfunctions: Secondary | ICD-10-CM | POA: Diagnosis not present

## 2020-07-01 DIAGNOSIS — R41841 Cognitive communication deficit: Secondary | ICD-10-CM | POA: Diagnosis not present

## 2020-07-02 DIAGNOSIS — R41841 Cognitive communication deficit: Secondary | ICD-10-CM | POA: Diagnosis not present

## 2020-07-02 DIAGNOSIS — R488 Other symbolic dysfunctions: Secondary | ICD-10-CM | POA: Diagnosis not present

## 2020-07-03 DIAGNOSIS — R488 Other symbolic dysfunctions: Secondary | ICD-10-CM | POA: Diagnosis not present

## 2020-07-06 DIAGNOSIS — R41841 Cognitive communication deficit: Secondary | ICD-10-CM | POA: Diagnosis not present

## 2020-07-06 DIAGNOSIS — R3915 Urgency of urination: Secondary | ICD-10-CM | POA: Diagnosis not present

## 2020-07-06 DIAGNOSIS — I10 Essential (primary) hypertension: Secondary | ICD-10-CM | POA: Diagnosis not present

## 2020-07-06 DIAGNOSIS — R488 Other symbolic dysfunctions: Secondary | ICD-10-CM | POA: Diagnosis not present

## 2020-07-06 DIAGNOSIS — R410 Disorientation, unspecified: Secondary | ICD-10-CM | POA: Diagnosis not present

## 2020-07-06 DIAGNOSIS — E039 Hypothyroidism, unspecified: Secondary | ICD-10-CM | POA: Diagnosis not present

## 2020-07-07 DIAGNOSIS — Z1159 Encounter for screening for other viral diseases: Secondary | ICD-10-CM | POA: Diagnosis not present

## 2020-07-07 DIAGNOSIS — R41841 Cognitive communication deficit: Secondary | ICD-10-CM | POA: Diagnosis not present

## 2020-07-07 DIAGNOSIS — Z20828 Contact with and (suspected) exposure to other viral communicable diseases: Secondary | ICD-10-CM | POA: Diagnosis not present

## 2020-07-07 DIAGNOSIS — R488 Other symbolic dysfunctions: Secondary | ICD-10-CM | POA: Diagnosis not present

## 2020-07-08 DIAGNOSIS — R488 Other symbolic dysfunctions: Secondary | ICD-10-CM | POA: Diagnosis not present

## 2020-07-09 DIAGNOSIS — R41841 Cognitive communication deficit: Secondary | ICD-10-CM | POA: Diagnosis not present

## 2020-07-09 DIAGNOSIS — R488 Other symbolic dysfunctions: Secondary | ICD-10-CM | POA: Diagnosis not present

## 2020-07-10 DIAGNOSIS — Z20828 Contact with and (suspected) exposure to other viral communicable diseases: Secondary | ICD-10-CM | POA: Diagnosis not present

## 2020-07-10 DIAGNOSIS — Z1159 Encounter for screening for other viral diseases: Secondary | ICD-10-CM | POA: Diagnosis not present

## 2020-07-13 DIAGNOSIS — R488 Other symbolic dysfunctions: Secondary | ICD-10-CM | POA: Diagnosis not present

## 2020-07-14 DIAGNOSIS — Z20828 Contact with and (suspected) exposure to other viral communicable diseases: Secondary | ICD-10-CM | POA: Diagnosis not present

## 2020-07-14 DIAGNOSIS — Z1159 Encounter for screening for other viral diseases: Secondary | ICD-10-CM | POA: Diagnosis not present

## 2020-07-14 DIAGNOSIS — R41841 Cognitive communication deficit: Secondary | ICD-10-CM | POA: Diagnosis not present

## 2020-07-14 DIAGNOSIS — R488 Other symbolic dysfunctions: Secondary | ICD-10-CM | POA: Diagnosis not present

## 2020-07-16 DIAGNOSIS — R41841 Cognitive communication deficit: Secondary | ICD-10-CM | POA: Diagnosis not present

## 2020-07-16 DIAGNOSIS — R488 Other symbolic dysfunctions: Secondary | ICD-10-CM | POA: Diagnosis not present

## 2020-07-17 DIAGNOSIS — Z20828 Contact with and (suspected) exposure to other viral communicable diseases: Secondary | ICD-10-CM | POA: Diagnosis not present

## 2020-07-17 DIAGNOSIS — R488 Other symbolic dysfunctions: Secondary | ICD-10-CM | POA: Diagnosis not present

## 2020-07-17 DIAGNOSIS — R41841 Cognitive communication deficit: Secondary | ICD-10-CM | POA: Diagnosis not present

## 2020-07-17 DIAGNOSIS — Z1159 Encounter for screening for other viral diseases: Secondary | ICD-10-CM | POA: Diagnosis not present

## 2020-07-20 DIAGNOSIS — R488 Other symbolic dysfunctions: Secondary | ICD-10-CM | POA: Diagnosis not present

## 2020-07-28 DIAGNOSIS — Z20828 Contact with and (suspected) exposure to other viral communicable diseases: Secondary | ICD-10-CM | POA: Diagnosis not present

## 2020-07-28 DIAGNOSIS — Z1159 Encounter for screening for other viral diseases: Secondary | ICD-10-CM | POA: Diagnosis not present

## 2020-07-31 DIAGNOSIS — Z1159 Encounter for screening for other viral diseases: Secondary | ICD-10-CM | POA: Diagnosis not present

## 2020-07-31 DIAGNOSIS — Z20828 Contact with and (suspected) exposure to other viral communicable diseases: Secondary | ICD-10-CM | POA: Diagnosis not present

## 2020-08-04 DIAGNOSIS — Z1159 Encounter for screening for other viral diseases: Secondary | ICD-10-CM | POA: Diagnosis not present

## 2020-08-04 DIAGNOSIS — Z20828 Contact with and (suspected) exposure to other viral communicable diseases: Secondary | ICD-10-CM | POA: Diagnosis not present

## 2020-08-07 DIAGNOSIS — Z1159 Encounter for screening for other viral diseases: Secondary | ICD-10-CM | POA: Diagnosis not present

## 2020-08-07 DIAGNOSIS — Z20828 Contact with and (suspected) exposure to other viral communicable diseases: Secondary | ICD-10-CM | POA: Diagnosis not present

## 2020-12-08 DIAGNOSIS — M6281 Muscle weakness (generalized): Secondary | ICD-10-CM | POA: Diagnosis not present

## 2020-12-10 DIAGNOSIS — M6281 Muscle weakness (generalized): Secondary | ICD-10-CM | POA: Diagnosis not present

## 2020-12-11 DIAGNOSIS — M6281 Muscle weakness (generalized): Secondary | ICD-10-CM | POA: Diagnosis not present

## 2020-12-14 DIAGNOSIS — M6281 Muscle weakness (generalized): Secondary | ICD-10-CM | POA: Diagnosis not present

## 2020-12-15 DIAGNOSIS — M6281 Muscle weakness (generalized): Secondary | ICD-10-CM | POA: Diagnosis not present

## 2020-12-17 DIAGNOSIS — M6281 Muscle weakness (generalized): Secondary | ICD-10-CM | POA: Diagnosis not present

## 2020-12-18 DIAGNOSIS — M6281 Muscle weakness (generalized): Secondary | ICD-10-CM | POA: Diagnosis not present

## 2020-12-21 DIAGNOSIS — M6281 Muscle weakness (generalized): Secondary | ICD-10-CM | POA: Diagnosis not present

## 2020-12-22 DIAGNOSIS — M6281 Muscle weakness (generalized): Secondary | ICD-10-CM | POA: Diagnosis not present

## 2020-12-23 DIAGNOSIS — M6281 Muscle weakness (generalized): Secondary | ICD-10-CM | POA: Diagnosis not present

## 2020-12-24 DIAGNOSIS — M6281 Muscle weakness (generalized): Secondary | ICD-10-CM | POA: Diagnosis not present

## 2020-12-29 DIAGNOSIS — M6281 Muscle weakness (generalized): Secondary | ICD-10-CM | POA: Diagnosis not present

## 2020-12-30 DIAGNOSIS — M6281 Muscle weakness (generalized): Secondary | ICD-10-CM | POA: Diagnosis not present

## 2020-12-31 DIAGNOSIS — M6281 Muscle weakness (generalized): Secondary | ICD-10-CM | POA: Diagnosis not present

## 2021-01-01 DIAGNOSIS — M6281 Muscle weakness (generalized): Secondary | ICD-10-CM | POA: Diagnosis not present

## 2021-01-04 DIAGNOSIS — M6281 Muscle weakness (generalized): Secondary | ICD-10-CM | POA: Diagnosis not present

## 2021-01-05 DIAGNOSIS — M6281 Muscle weakness (generalized): Secondary | ICD-10-CM | POA: Diagnosis not present

## 2021-01-07 DIAGNOSIS — M6281 Muscle weakness (generalized): Secondary | ICD-10-CM | POA: Diagnosis not present

## 2021-01-08 DIAGNOSIS — M6281 Muscle weakness (generalized): Secondary | ICD-10-CM | POA: Diagnosis not present

## 2021-01-11 DIAGNOSIS — M6281 Muscle weakness (generalized): Secondary | ICD-10-CM | POA: Diagnosis not present

## 2021-01-12 DIAGNOSIS — M6281 Muscle weakness (generalized): Secondary | ICD-10-CM | POA: Diagnosis not present

## 2021-01-13 DIAGNOSIS — M6281 Muscle weakness (generalized): Secondary | ICD-10-CM | POA: Diagnosis not present

## 2021-01-15 DIAGNOSIS — M6281 Muscle weakness (generalized): Secondary | ICD-10-CM | POA: Diagnosis not present

## 2021-01-18 DIAGNOSIS — M6281 Muscle weakness (generalized): Secondary | ICD-10-CM | POA: Diagnosis not present

## 2021-01-19 DIAGNOSIS — M6281 Muscle weakness (generalized): Secondary | ICD-10-CM | POA: Diagnosis not present

## 2021-01-20 DIAGNOSIS — M6281 Muscle weakness (generalized): Secondary | ICD-10-CM | POA: Diagnosis not present

## 2021-01-21 DIAGNOSIS — M6281 Muscle weakness (generalized): Secondary | ICD-10-CM | POA: Diagnosis not present

## 2021-01-27 DIAGNOSIS — M6281 Muscle weakness (generalized): Secondary | ICD-10-CM | POA: Diagnosis not present

## 2021-01-29 DIAGNOSIS — M6281 Muscle weakness (generalized): Secondary | ICD-10-CM | POA: Diagnosis not present

## 2021-02-04 DIAGNOSIS — M6281 Muscle weakness (generalized): Secondary | ICD-10-CM | POA: Diagnosis not present

## 2021-02-05 DIAGNOSIS — M6281 Muscle weakness (generalized): Secondary | ICD-10-CM | POA: Diagnosis not present

## 2021-02-08 DIAGNOSIS — N183 Chronic kidney disease, stage 3 unspecified: Secondary | ICD-10-CM | POA: Diagnosis not present

## 2021-02-08 DIAGNOSIS — R7309 Other abnormal glucose: Secondary | ICD-10-CM | POA: Diagnosis not present

## 2021-02-08 DIAGNOSIS — Z0000 Encounter for general adult medical examination without abnormal findings: Secondary | ICD-10-CM | POA: Diagnosis not present

## 2021-02-08 DIAGNOSIS — I6523 Occlusion and stenosis of bilateral carotid arteries: Secondary | ICD-10-CM | POA: Diagnosis not present

## 2021-02-08 DIAGNOSIS — H6123 Impacted cerumen, bilateral: Secondary | ICD-10-CM | POA: Diagnosis not present

## 2021-02-08 DIAGNOSIS — Z1389 Encounter for screening for other disorder: Secondary | ICD-10-CM | POA: Diagnosis not present

## 2021-02-09 DIAGNOSIS — M6281 Muscle weakness (generalized): Secondary | ICD-10-CM | POA: Diagnosis not present

## 2021-02-10 DIAGNOSIS — M6281 Muscle weakness (generalized): Secondary | ICD-10-CM | POA: Diagnosis not present

## 2021-02-11 DIAGNOSIS — M6281 Muscle weakness (generalized): Secondary | ICD-10-CM | POA: Diagnosis not present

## 2021-02-15 ENCOUNTER — Other Ambulatory Visit: Payer: Self-pay | Admitting: Internal Medicine

## 2021-02-15 DIAGNOSIS — Z1231 Encounter for screening mammogram for malignant neoplasm of breast: Secondary | ICD-10-CM

## 2021-02-15 DIAGNOSIS — M6281 Muscle weakness (generalized): Secondary | ICD-10-CM | POA: Diagnosis not present

## 2021-02-17 DIAGNOSIS — M6281 Muscle weakness (generalized): Secondary | ICD-10-CM | POA: Diagnosis not present

## 2021-02-19 DIAGNOSIS — M6281 Muscle weakness (generalized): Secondary | ICD-10-CM | POA: Diagnosis not present

## 2021-04-15 DIAGNOSIS — Z23 Encounter for immunization: Secondary | ICD-10-CM | POA: Diagnosis not present

## 2021-04-27 ENCOUNTER — Other Ambulatory Visit: Payer: Self-pay

## 2021-04-27 ENCOUNTER — Ambulatory Visit
Admission: RE | Admit: 2021-04-27 | Discharge: 2021-04-27 | Disposition: A | Discharge status: Home / Self Care | Payer: Medicare Other | Source: Ambulatory Visit | Attending: Internal Medicine | Admitting: Internal Medicine

## 2021-04-27 DIAGNOSIS — Z1231 Encounter for screening mammogram for malignant neoplasm of breast: Secondary | ICD-10-CM | POA: Diagnosis not present

## 2021-06-04 DIAGNOSIS — M6281 Muscle weakness (generalized): Secondary | ICD-10-CM | POA: Diagnosis not present

## 2021-06-04 DIAGNOSIS — R41841 Cognitive communication deficit: Secondary | ICD-10-CM | POA: Diagnosis not present

## 2021-06-04 DIAGNOSIS — R488 Other symbolic dysfunctions: Secondary | ICD-10-CM | POA: Diagnosis not present

## 2021-06-07 DIAGNOSIS — R488 Other symbolic dysfunctions: Secondary | ICD-10-CM | POA: Diagnosis not present

## 2021-06-07 DIAGNOSIS — R41841 Cognitive communication deficit: Secondary | ICD-10-CM | POA: Diagnosis not present

## 2021-06-07 DIAGNOSIS — M6281 Muscle weakness (generalized): Secondary | ICD-10-CM | POA: Diagnosis not present

## 2021-06-08 DIAGNOSIS — R488 Other symbolic dysfunctions: Secondary | ICD-10-CM | POA: Diagnosis not present

## 2021-06-08 DIAGNOSIS — M6281 Muscle weakness (generalized): Secondary | ICD-10-CM | POA: Diagnosis not present

## 2021-06-08 DIAGNOSIS — R41841 Cognitive communication deficit: Secondary | ICD-10-CM | POA: Diagnosis not present

## 2021-06-10 DIAGNOSIS — M6281 Muscle weakness (generalized): Secondary | ICD-10-CM | POA: Diagnosis not present

## 2021-06-10 DIAGNOSIS — R41841 Cognitive communication deficit: Secondary | ICD-10-CM | POA: Diagnosis not present

## 2021-06-10 DIAGNOSIS — R488 Other symbolic dysfunctions: Secondary | ICD-10-CM | POA: Diagnosis not present

## 2021-06-12 DIAGNOSIS — R41841 Cognitive communication deficit: Secondary | ICD-10-CM | POA: Diagnosis not present

## 2021-06-12 DIAGNOSIS — R488 Other symbolic dysfunctions: Secondary | ICD-10-CM | POA: Diagnosis not present

## 2021-06-12 DIAGNOSIS — M6281 Muscle weakness (generalized): Secondary | ICD-10-CM | POA: Diagnosis not present

## 2021-06-14 DIAGNOSIS — R41841 Cognitive communication deficit: Secondary | ICD-10-CM | POA: Diagnosis not present

## 2021-06-14 DIAGNOSIS — M6281 Muscle weakness (generalized): Secondary | ICD-10-CM | POA: Diagnosis not present

## 2021-06-14 DIAGNOSIS — R488 Other symbolic dysfunctions: Secondary | ICD-10-CM | POA: Diagnosis not present

## 2021-06-17 DIAGNOSIS — M6281 Muscle weakness (generalized): Secondary | ICD-10-CM | POA: Diagnosis not present

## 2021-06-17 DIAGNOSIS — R488 Other symbolic dysfunctions: Secondary | ICD-10-CM | POA: Diagnosis not present

## 2021-06-17 DIAGNOSIS — R41841 Cognitive communication deficit: Secondary | ICD-10-CM | POA: Diagnosis not present

## 2021-06-29 DIAGNOSIS — M6281 Muscle weakness (generalized): Secondary | ICD-10-CM | POA: Diagnosis not present

## 2021-06-29 DIAGNOSIS — R488 Other symbolic dysfunctions: Secondary | ICD-10-CM | POA: Diagnosis not present

## 2021-06-29 DIAGNOSIS — R41841 Cognitive communication deficit: Secondary | ICD-10-CM | POA: Diagnosis not present

## 2021-06-30 DIAGNOSIS — R41841 Cognitive communication deficit: Secondary | ICD-10-CM | POA: Diagnosis not present

## 2021-06-30 DIAGNOSIS — M6281 Muscle weakness (generalized): Secondary | ICD-10-CM | POA: Diagnosis not present

## 2021-06-30 DIAGNOSIS — R488 Other symbolic dysfunctions: Secondary | ICD-10-CM | POA: Diagnosis not present

## 2022-07-01 DEATH — deceased
